# Patient Record
Sex: Female | Born: 1945
Health system: Southern US, Community
[De-identification: ages and names within clinical notes are randomized; demographics above are authoritative.]

## PROBLEM LIST (undated history)

## (undated) DIAGNOSIS — C50919 Malignant neoplasm of unspecified site of unspecified female breast: Secondary | ICD-10-CM

## (undated) DIAGNOSIS — B029 Zoster without complications: Secondary | ICD-10-CM

## (undated) DIAGNOSIS — K2970 Gastritis, unspecified, without bleeding: Secondary | ICD-10-CM

## (undated) DIAGNOSIS — G629 Polyneuropathy, unspecified: Secondary | ICD-10-CM

## (undated) DIAGNOSIS — I498 Other specified cardiac arrhythmias: Secondary | ICD-10-CM

## (undated) DIAGNOSIS — G43909 Migraine, unspecified, not intractable, without status migrainosus: Secondary | ICD-10-CM

## (undated) HISTORY — PX: CHOLECYSTECTOMY: SHX55

## (undated) HISTORY — PX: ABDOMINAL HYSTERECTOMY: SHX81

## (undated) HISTORY — PX: APPENDECTOMY: SHX54

## (undated) HISTORY — PX: TOTAL MASTECTOMY: SHX6129

---

## 2012-09-16 ENCOUNTER — Encounter (HOSPITAL_BASED_OUTPATIENT_CLINIC_OR_DEPARTMENT_OTHER): Payer: Self-pay | Admitting: Emergency Medicine

## 2012-09-16 ENCOUNTER — Inpatient Hospital Stay (HOSPITAL_BASED_OUTPATIENT_CLINIC_OR_DEPARTMENT_OTHER)
Admission: EM | Admit: 2012-09-16 | Discharge: 2012-09-22 | DRG: 441 | Disposition: A | Discharge status: Home / Self Care | Payer: Medicare Other | Attending: Internal Medicine | Admitting: Internal Medicine

## 2012-09-16 ENCOUNTER — Emergency Department (HOSPITAL_BASED_OUTPATIENT_CLINIC_OR_DEPARTMENT_OTHER): Payer: Medicare Other

## 2012-09-16 DIAGNOSIS — Z681 Body mass index (BMI) 19 or less, adult: Secondary | ICD-10-CM

## 2012-09-16 DIAGNOSIS — Z79899 Other long term (current) drug therapy: Secondary | ICD-10-CM

## 2012-09-16 DIAGNOSIS — F411 Generalized anxiety disorder: Secondary | ICD-10-CM | POA: Diagnosis present

## 2012-09-16 DIAGNOSIS — K759 Inflammatory liver disease, unspecified: Secondary | ICD-10-CM

## 2012-09-16 DIAGNOSIS — R509 Fever, unspecified: Secondary | ICD-10-CM

## 2012-09-16 DIAGNOSIS — R112 Nausea with vomiting, unspecified: Secondary | ICD-10-CM | POA: Diagnosis present

## 2012-09-16 DIAGNOSIS — B027 Disseminated zoster: Secondary | ICD-10-CM

## 2012-09-16 DIAGNOSIS — E43 Unspecified severe protein-calorie malnutrition: Secondary | ICD-10-CM

## 2012-09-16 DIAGNOSIS — Z8673 Personal history of transient ischemic attack (TIA), and cerebral infarction without residual deficits: Secondary | ICD-10-CM

## 2012-09-16 DIAGNOSIS — Z9221 Personal history of antineoplastic chemotherapy: Secondary | ICD-10-CM

## 2012-09-16 DIAGNOSIS — R7402 Elevation of levels of lactic acid dehydrogenase (LDH): Secondary | ICD-10-CM | POA: Diagnosis present

## 2012-09-16 DIAGNOSIS — Z853 Personal history of malignant neoplasm of breast: Secondary | ICD-10-CM

## 2012-09-16 DIAGNOSIS — R7401 Elevation of levels of liver transaminase levels: Secondary | ICD-10-CM

## 2012-09-16 DIAGNOSIS — B029 Zoster without complications: Secondary | ICD-10-CM

## 2012-09-16 DIAGNOSIS — Z7901 Long term (current) use of anticoagulants: Secondary | ICD-10-CM

## 2012-09-16 DIAGNOSIS — Z8611 Personal history of tuberculosis: Secondary | ICD-10-CM

## 2012-09-16 DIAGNOSIS — G609 Hereditary and idiopathic neuropathy, unspecified: Secondary | ICD-10-CM | POA: Diagnosis present

## 2012-09-16 DIAGNOSIS — K769 Liver disease, unspecified: Principal | ICD-10-CM

## 2012-09-16 HISTORY — DX: Polyneuropathy, unspecified: G62.9

## 2012-09-16 HISTORY — DX: Zoster without complications: B02.9

## 2012-09-16 HISTORY — DX: Malignant neoplasm of unspecified site of unspecified female breast: C50.919

## 2012-09-16 HISTORY — DX: Gastritis, unspecified, without bleeding: K29.70

## 2012-09-16 HISTORY — DX: Migraine, unspecified, not intractable, without status migrainosus: G43.909

## 2012-09-16 LAB — URINE MICROSCOPIC-ADD ON

## 2012-09-16 LAB — CBC WITH DIFFERENTIAL/PLATELET
Basophils Absolute: 0 10*3/uL (ref 0.0–0.1)
Basophils Relative: 0 % (ref 0–1)
Eosinophils Absolute: 0 10*3/uL (ref 0.0–0.7)
Eosinophils Relative: 0 % (ref 0–5)
HCT: 38.3 % (ref 36.0–46.0)
MCHC: 33.2 g/dL (ref 30.0–36.0)
MCV: 91.4 fL (ref 78.0–100.0)
Monocytes Absolute: 0.6 10*3/uL (ref 0.1–1.0)
Platelets: 195 10*3/uL (ref 150–400)
RDW: 12.3 % (ref 11.5–15.5)
WBC: 7.4 10*3/uL (ref 4.0–10.5)

## 2012-09-16 LAB — URINALYSIS, ROUTINE W REFLEX MICROSCOPIC
Glucose, UA: NEGATIVE mg/dL
Leukocytes, UA: NEGATIVE
Protein, ur: NEGATIVE mg/dL
Specific Gravity, Urine: 1.02 (ref 1.005–1.030)
pH: 6 (ref 5.0–8.0)

## 2012-09-16 LAB — COMPREHENSIVE METABOLIC PANEL
ALT: 378 U/L — ABNORMAL HIGH (ref 0–35)
AST: 839 U/L — ABNORMAL HIGH (ref 0–37)
Albumin: 3.9 g/dL (ref 3.5–5.2)
Calcium: 9.1 mg/dL (ref 8.4–10.5)
Creatinine, Ser: 0.6 mg/dL (ref 0.50–1.10)
GFR calc non Af Amer: >90 mL/min (ref >90)
Sodium: 139 mEq/L (ref 135–145)
Total Protein: 6.4 g/dL (ref 6.0–8.3)

## 2012-09-16 MED ORDER — FENTANYL CITRATE 0.05 MG/ML IJ SOLN
50.0000 ug | Freq: Once | INTRAMUSCULAR | Status: AC
  Administered 2012-09-16: 50 ug via INTRAVENOUS
  Filled 2012-09-16: qty 2

## 2012-09-16 MED ORDER — MEPERIDINE HCL 50 MG/ML IJ SOLN
25.0000 mg | Freq: Once | INTRAMUSCULAR | Status: AC
  Administered 2012-09-16: 25 mg via INTRAVENOUS
  Filled 2012-09-16: qty 1

## 2012-09-16 MED ORDER — PROMETHAZINE HCL 25 MG/ML IJ SOLN
12.5000 mg | Freq: Once | INTRAMUSCULAR | Status: AC
  Administered 2012-09-16: 12.5 mg via INTRAVENOUS
  Filled 2012-09-16: qty 1

## 2012-09-16 MED ORDER — SODIUM CHLORIDE 0.9 % IV BOLUS (SEPSIS)
1000.0000 mL | Freq: Once | INTRAVENOUS | Status: AC
  Administered 2012-09-16: 1000 mL via INTRAVENOUS

## 2012-09-16 MED ORDER — ONDANSETRON HCL 4 MG/2ML IJ SOLN
4.0000 mg | Freq: Once | INTRAMUSCULAR | Status: AC
  Administered 2012-09-16: 4 mg via INTRAVENOUS
  Filled 2012-09-16: qty 2

## 2012-09-16 NOTE — ED Provider Notes (Signed)
CSN: [id number]     Arrival date & time 09/16/12  1627 History  This chart was scribed for Enid Skeens, MD by Ardelia Mems, ED Scribe. This patient was seen in room MH10/MH10 and the patient's care was started at 5:26 PM.   First MD Initiated Contact with Patient 09/16/12 1719     Chief Complaint  Patient presents with  . Herpes Zoster    The history is provided by the patient. No language interpreter was used.    HPI Comments: Monica Khan is a 67 y.o. female with a history of CA and shingles who presents to the Emergency Department complaining of a shingles rash onset about 1 week ago. She states that she is a cancer survivor, and that her history of 11 years of chemotherapy treatment has compromised her immune system. She states that she used to get shingles once a year, but has been getting it "all the time" these past 2 years. She also states that she has lost about 20 pounds in recent months. She states that she was seen for these symptoms 1 week ago and was started on acyclovir. She states that her shingles rash has been mostly present on her right shoulder and the right side of her back. She states that her rash is gradually improving, but she states that the shingles pain, which she is familiar with, persists despite this. She also reports a migraine headache, with greatest pain in her left eye, onset this morning. She states that she has a history of similar headaches. She also states that she has pain in her left knee.  She also reports waxing and waning, moderate "sharp, burning" RUQ abdominal pain onset a few hours ago, after she ate lunch today. She states that she ate a normal, non-suspicious meal today. She reports associated nausea, with 2 episodes of emesis, without blood. She states states that she has history of gastritis. She states that's she has a history of cholecystectomy, and a history of pancreatitis 30 years ago. She states that she has had several kidney stones since  receiving her chemotherapy. She states that her current abdominal pain does not feel like previous pain with kidney stones. She denies any history of hepatitis. She denies chest pain, leg swelling, fever, diarrhea or any other symptoms.    Past Medical History  Diagnosis Date  . Cancer   . Shingles   . Gastritis   . Peripheral neuropathy   . Migraines    Past Surgical History  Procedure Laterality Date  . Total mastectomy    . Appendectomy    . Cholecystectomy    . Abdominal hysterectomy     History reviewed. No pertinent family history. History  Substance Use Topics  . Smoking status: Not on file  . Smokeless tobacco: Not on file  . Alcohol Use: Not on file   OB History   Grav Para Term Preterm Abortions TAB SAB Ect Mult Living                 Review of Systems  Constitutional: Negative for fever.  Cardiovascular: Negative for chest pain and leg swelling.  Gastrointestinal: Positive for nausea, vomiting and abdominal pain (RUQ). Negative for diarrhea.  Musculoskeletal: Positive for arthralgias (left knee).  Skin: Positive for rash.  Neurological: Positive for headaches.  All other systems reviewed and are negative.    Allergies  Cephalosporins; Penicillins; Codeine; Compazine; and Morphine and related  Home Medications   Current Outpatient Rx  Name  Route  Sig  Dispense  Refill  . meperidine (DEMEROL) 50 MG tablet   Oral   Take 50 mg by mouth every 4 (four) hours as needed for pain.         Marland Kitchen omeprazole (PRILOSEC) 40 MG capsule   Oral   Take 40 mg by mouth daily.         . temazepam (RESTORIL) 30 MG capsule   Oral   Take 30 mg by mouth at bedtime as needed for sleep.         . traZODone (DESYREL) 50 MG tablet   Oral   Take 50 mg by mouth at bedtime.          Triage Vitals: BP 115/70  Pulse 90  Temp(Src) 98.1 F (36.7 C) (Oral)  Resp 16  Ht 4\' 10"  (1.473 m)  Wt 82 lb (37.195 kg)  BMI 17.14 kg/m2  SpO2 100%  Physical Exam  Nursing  note and vitals reviewed. Constitutional: She is oriented to person, place, and time. She appears well-developed and well-nourished.  HENT:  Head: Normocephalic and atraumatic.  Eyes: Conjunctivae are normal. Pupils are equal, round, and reactive to light.  Neck: Normal range of motion. Neck supple. No tracheal deviation present.  Cardiovascular: Normal rate, regular rhythm and normal heart sounds.   No murmur heard. Pulmonary/Chest: Effort normal and breath sounds normal. No respiratory distress.  Abdominal: Soft. Bowel sounds are normal. There is no tenderness.  Musculoskeletal: Normal range of motion. She exhibits no edema.  Neurological: She is alert and oriented to person, place, and time. No cranial nerve deficit.  Cranial nerves 2-12 are grossly intact.  Skin: Skin is warm and dry. Rash noted.  A few excoriations on the right scapula, no induration, no discharge.  Psychiatric: She has a normal mood and affect.    ED Course   Medications  sodium chloride 0.9 % bolus 1,000 mL (0 mLs Intravenous Stopped 09/16/12 1944)  meperidine (DEMEROL) injection 25 mg (25 mg Intravenous Given 09/16/12 1846)  promethazine (PHENERGAN) injection 12.5 mg (12.5 mg Intravenous Given 09/16/12 1845)    Procedures (including critical care time)  DIAGNOSTIC STUDIES: Oxygen Saturation is 100% on RA, normal by my interpretation.    COORDINATION OF CARE: 5:50 PM- Pt advised of plan for treatment with Demerol, Phenergan and IV fluids, along with plan for diagnostic lab work and radiology and pt agrees.   Labs Reviewed  CBC WITH DIFFERENTIAL - Abnormal; Notable for the following:    Neutrophils Relative % 79 (*)    All other components within normal limits  COMPREHENSIVE METABOLIC PANEL - Abnormal; Notable for the following:    AST 839 (*)    ALT 378 (*)    All other components within normal limits  URINALYSIS, ROUTINE W REFLEX MICROSCOPIC - Abnormal; Notable for the following:    Color, Urine AMBER  (*)    Hgb urine dipstick MODERATE (*)    Bilirubin Urine SMALL (*)    All other components within normal limits  LIPASE, BLOOD  URINE MICROSCOPIC-ADD ON    Ct Abdomen Pelvis Wo Contrast  09/16/2012   *RADIOLOGY REPORT*  Clinical Data: Right flank pain  CT ABDOMEN AND PELVIS WITHOUT CONTRAST  Technique:  Multidetector CT imaging of the abdomen and pelvis was performed following the standard protocol without intravenous contrast.  Comparison: None.  Findings: No urinary calculus.  No hydronephrosis.  Post cholecystectomy clips.  Pneumobilia in the liver is most likely related to biliary instrumentation.  Spleen, pancreas, adrenal glands are grossly within normal limits.  Prominent stool burden in the ascending and transverse colon.  IMPRESSION: Postoperative changes.  No evidence of urinary obstruction.  Prominent stool burden in the proximal colon.   Original Report Authenticated By: Jolaine Click, M.D.    No diagnosis found.  MDM  I personally performed the services described in this documentation, which was scribed in my presence. The recorded information has been reviewed and is accurate.  Pain recurrent in ED. Pt does not have her gb.  Discussed possible causes of RUQ and LFT elevation, possibly related to antivirals however further eval required.  Pt preferred inpt instead of outpt fup especially with pain control an issue.  Spoke with Dr Mendel Corning, accepted general medicine. Ct Abdomen Pelvis Wo Contrast  09/16/2012   *RADIOLOGY REPORT*  Clinical Data: Right flank pain  CT ABDOMEN AND PELVIS WITHOUT CONTRAST  Technique:  Multidetector CT imaging of the abdomen and pelvis was performed following the standard protocol without intravenous contrast.  Comparison: None.  Findings: No urinary calculus.  No hydronephrosis.  Post cholecystectomy clips.  Pneumobilia in the liver is most likely related to biliary instrumentation.  Spleen, pancreas, adrenal glands are grossly within normal limits.   Prominent stool burden in the ascending and transverse colon.  IMPRESSION: Postoperative changes.  No evidence of urinary obstruction.  Prominent stool burden in the proximal colon.   Original Report Authenticated By: Jolaine Click, M.D.     Enid Skeens, MD 09/16/12 2120

## 2012-09-16 NOTE — ED Notes (Signed)
Pt states 11 year breast cancer survivor, since having chemo, states has had shingles frequently, has had symptoms for a week and started taking acyclovir, now with pain in RUQ, states has had this pain with acyclovir before

## 2012-09-16 NOTE — ED Notes (Signed)
Assigned to room 5N32 @ [facility], RN notified, [facility] called for transport.

## 2012-09-17 ENCOUNTER — Inpatient Hospital Stay (HOSPITAL_COMMUNITY): Payer: Medicare Other

## 2012-09-17 ENCOUNTER — Encounter (HOSPITAL_COMMUNITY): Payer: Self-pay | Admitting: Internal Medicine

## 2012-09-17 DIAGNOSIS — Z853 Personal history of malignant neoplasm of breast: Secondary | ICD-10-CM

## 2012-09-17 DIAGNOSIS — R112 Nausea with vomiting, unspecified: Secondary | ICD-10-CM | POA: Diagnosis present

## 2012-09-17 DIAGNOSIS — B029 Zoster without complications: Secondary | ICD-10-CM | POA: Diagnosis present

## 2012-09-17 DIAGNOSIS — K769 Liver disease, unspecified: Secondary | ICD-10-CM | POA: Diagnosis present

## 2012-09-17 LAB — CBC
Platelets: 185 10*3/uL (ref 150–400)
RBC: 4.11 MIL/uL (ref 3.87–5.11)
WBC: 6.8 10*3/uL (ref 4.0–10.5)

## 2012-09-17 LAB — COMPREHENSIVE METABOLIC PANEL
ALT: 582 U/L — ABNORMAL HIGH (ref 0–35)
AST: 689 U/L — ABNORMAL HIGH (ref 0–37)
CO2: 28 mEq/L (ref 19–32)
Calcium: 8.3 mg/dL — ABNORMAL LOW (ref 8.4–10.5)
Chloride: 102 mEq/L (ref 96–112)
GFR calc non Af Amer: >90 mL/min (ref >90)
Sodium: 137 mEq/L (ref 135–145)
Total Bilirubin: 1.1 mg/dL (ref 0.3–1.2)

## 2012-09-17 MED ORDER — TRAZODONE HCL 50 MG PO TABS
50.0000 mg | ORAL_TABLET | Freq: Every day | ORAL | Status: DC
  Administered 2012-09-17 – 2012-09-21 (×5): 50 mg via ORAL
  Filled 2012-09-17 (×7): qty 1

## 2012-09-17 MED ORDER — ZOLPIDEM TARTRATE 5 MG PO TABS
5.0000 mg | ORAL_TABLET | Freq: Every evening | ORAL | Status: DC | PRN
  Administered 2012-09-17 – 2012-09-21 (×4): 5 mg via ORAL
  Filled 2012-09-17 (×4): qty 1

## 2012-09-17 MED ORDER — MEPERIDINE HCL 50 MG PO TABS
50.0000 mg | ORAL_TABLET | Freq: Four times a day (QID) | ORAL | Status: DC | PRN
  Administered 2012-09-17 (×3): 50 mg via ORAL
  Administered 2012-09-18 (×2): 100 mg via ORAL
  Administered 2012-09-18 (×2): 50 mg via ORAL
  Administered 2012-09-18 – 2012-09-22 (×14): 100 mg via ORAL
  Filled 2012-09-17 (×10): qty 2
  Filled 2012-09-17: qty 1
  Filled 2012-09-17 (×6): qty 2
  Filled 2012-09-17: qty 1
  Filled 2012-09-17: qty 2
  Filled 2012-09-17: qty 1

## 2012-09-17 MED ORDER — PANTOPRAZOLE SODIUM 40 MG PO TBEC
80.0000 mg | DELAYED_RELEASE_TABLET | Freq: Every day | ORAL | Status: DC
  Administered 2012-09-17 – 2012-09-22 (×6): 80 mg via ORAL
  Filled 2012-09-17 (×6): qty 2

## 2012-09-17 MED ORDER — HEPARIN SODIUM (PORCINE) 5000 UNIT/ML IJ SOLN
5000.0000 [IU] | Freq: Three times a day (TID) | INTRAMUSCULAR | Status: DC
  Administered 2012-09-17 – 2012-09-19 (×7): 5000 [IU] via SUBCUTANEOUS
  Filled 2012-09-17 (×11): qty 1

## 2012-09-17 MED ORDER — ONDANSETRON HCL 4 MG/2ML IJ SOLN
4.0000 mg | Freq: Four times a day (QID) | INTRAMUSCULAR | Status: DC | PRN
  Administered 2012-09-17: 4 mg via INTRAVENOUS
  Filled 2012-09-17 (×3): qty 2

## 2012-09-17 MED ORDER — VALACYCLOVIR HCL 500 MG PO TABS
1000.0000 mg | ORAL_TABLET | Freq: Three times a day (TID) | ORAL | Status: DC
  Administered 2012-09-17 – 2012-09-22 (×14): 1000 mg via ORAL
  Filled 2012-09-17 (×19): qty 2

## 2012-09-17 MED ORDER — MEPERIDINE HCL 50 MG PO TABS
50.0000 mg | ORAL_TABLET | ORAL | Status: DC | PRN
  Administered 2012-09-17 (×3): 50 mg via ORAL
  Filled 2012-09-17 (×3): qty 1

## 2012-09-17 MED ORDER — PROMETHAZINE HCL 25 MG/ML IJ SOLN
12.5000 mg | Freq: Four times a day (QID) | INTRAMUSCULAR | Status: DC | PRN
  Administered 2012-09-17 – 2012-09-21 (×15): 12.5 mg via INTRAVENOUS
  Filled 2012-09-17 (×16): qty 1

## 2012-09-17 MED ORDER — TEMAZEPAM 15 MG PO CAPS: 30.0000 mg | ORAL_CAPSULE | Freq: Every evening | ORAL | Status: DC | PRN

## 2012-09-17 NOTE — H&P (Signed)
[facility] History and Physical  Monica Khan ZOX:[id number] DOB: [birth year] DOA: 09/16/2012  Referring physician: ED PCP: No primary provider on file.  Chief Complaint: Weight loss, Shingles, RUQ abd pain  HPI: Monica Khan is a 67 y.o. female who presents to [facility] ED with c/o abdominal pain.  Abd pain is moderate in intensity, sharp, located in her RUQ, and onset a few hours ago after eating a normal meal (h/o cholecystectomy).  Associated nausea with 2 episodes of vomiting (NBNB).  H/o gastritis as well.  No previous known history of hepatitis, does get recurrent shingles frequently and has a singles outbreak at this time she states for which she is on acyclovir.  In the ED she was found to have markedly elevated transaminases and she was transferred to [facility] for further workup.  Review of Systems: 12 systems reviewed and otherwise negative.  Past Medical History  Diagnosis Date  . Breast cancer     In remission  . Shingles     Recurrent  . Gastritis   . Peripheral neuropathy   . Migraines    Past Surgical History  Procedure Laterality Date  . Total mastectomy    . Appendectomy    . Cholecystectomy    . Abdominal hysterectomy     Social History:  reports that she does not drink alcohol or use illicit drugs. Her tobacco history is not on file.   Allergies  Allergen Reactions  . Cephalosporins Anaphylaxis  . Penicillins Anaphylaxis  . Codeine     sickl  . Compazine (Prochlorperazine Edisylate)     Throat swells  . Morphine And Related     Nausea and vomiting    History reviewed. No pertinent family history.  Monica Khan died of lung cancer.  Prior to Admission medications   Medication Sig Start Date End Date Taking? Authorizing Provider  meperidine (DEMEROL) 50 MG tablet Take 50 mg by mouth every 4 (four) hours as needed for pain.   Yes Historical Provider, MD  omeprazole (PRILOSEC) 40 MG capsule Take 40 mg by mouth daily.   Yes Historical Provider, MD  temazepam  (RESTORIL) 30 MG capsule Take 30 mg by mouth at bedtime as needed for sleep.   Yes Historical Provider, MD  traZODone (DESYREL) 50 MG tablet Take 50 mg by mouth at bedtime.   Yes Historical Provider, MD   Physical Exam: Filed Vitals:   09/16/12 2257  BP: 115/85  Pulse: 96  Temp: 99.9 F (37.7 C)  Resp: 18     General:  NAD, resting comfortably in bed  Eyes: PEERLA EOMI  ENT: mucous membranes moist  Neck: supple w/o JVD  Cardiovascular: RRR w/o MRG  Respiratory: CTA B  Abdomen: soft, nt, nd, bs+  Skin: no rash nor lesion  Musculoskeletal: MAE, full ROM all 4 extremities  Psychiatric: normal tone and affect  Neurologic: AAOx3, grossly non-focal   Labs on Admission:  Basic Metabolic Panel:  Recent Labs Lab 09/16/12 1845  NA 139  K 3.6  CL 102  CO2 30  GLUCOSE 92  BUN 13  CREATININE 0.60  CALCIUM 9.1   Liver Function Tests:  Recent Labs Lab 09/16/12 1845  AST 839*  ALT 378*  ALKPHOS 113  BILITOT 1.1  PROT 6.4  ALBUMIN 3.9    Recent Labs Lab 09/16/12 1845  LIPASE 28   No results found for this basename: AMMONIA,  in the last 168 hours CBC:  Recent Labs Lab 09/16/12 1845  WBC 7.4  NEUTROABS 5.9  HGB 12.7  HCT 38.3  MCV 91.4  PLT 195   Cardiac Enzymes: No results found for this basename: CKTOTAL, CKMB, CKMBINDEX, TROPONINI,  in the last 168 hours  BNP (last 3 results) No results found for this basename: PROBNP,  in the last 8760 hours CBG: No results found for this basename: GLUCAP,  in the last 168 hours  Radiological Exams on Admission: Ct Abdomen Pelvis Wo Contrast  09/16/2012   *RADIOLOGY REPORT*  Clinical Data: Right flank pain  CT ABDOMEN AND PELVIS WITHOUT CONTRAST  Technique:  Multidetector CT imaging of the abdomen and pelvis was performed following the standard protocol without intravenous contrast.  Comparison: None.  Findings: No urinary calculus.  No hydronephrosis.  Post cholecystectomy clips.  Pneumobilia in the liver  is most likely related to biliary instrumentation.  Spleen, pancreas, adrenal glands are grossly within normal limits.  Prominent stool burden in the ascending and transverse colon.  IMPRESSION: Postoperative changes.  No evidence of urinary obstruction.  Prominent stool burden in the proximal colon.   Original Report Authenticated By: Jolaine Click, M.D.    EKG: Independently reviewed.  Assessment/Plan Principal Problem:   Transaminitis Active Problems:   Hepatocellular injury   Nausea and vomiting   Shingles   History of breast cancer   1. Transaminitis - with a hepatocellular pattern, Despite the  > 2:1 AST:ALT ratio, the patient denies any EtOH use what so ever, last drink was a few oz of wine when her Monica Khan died 2 years ago, nothing since then, never has been a heavy drinker.  Her biggest fear is that her BRCA has come back with mets to liver (wasnt seen on CT scan but this is in the differential of course).  Have ordered liver studies including viral, autoimmune, and imaging studies of the liver to try and work this up further.  CT scan of liver didn't show any suspicious lesions.  While acyclovir CAN cause elevation of liver enzymes, neither I nor pharmacist have ever heard of it being hepatoxic to this degree (usually renal involvement).  Likely patient will benefit from GI involvement in the morning. 2. Shingles continue acyclovir 3. N/V - zofran    Code Status: Full Code (must indicate code status--if unknown or must be presumed, indicate so) Family Communication: No family in room (indicate person spoken with, if applicable, with phone number if by telephone) Disposition Plan: Admit to inpatient (indicate anticipated LOS)  Time spent: 70 min  Monica Khan M. [facility] Pager [phone]  If 7PM-7AM, please contact night-coverage [email or web address] Password [email or web address] 09/17/2012, 12:30 AM

## 2012-09-17 NOTE — Consult Note (Signed)
Subjective:   HPI  The patient is a 67 year old female who presented to the hospital emergency room with complaints of right upper quadrant sharp abdominal pain which started yesterday. She had some nausea and vomiting associated with this. She was found to have elevated liver enzymes. Total bilirubin on presentation was 1.1 alkaline phosphatase 113, AST 839 and ALT 378. She gives no history of hepatitis. She did have her gallbladder removed years ago. A CT scan of the abdomen was done but does not reveal any specific reason for this pain to be present. There was some pneumobilia. I asked her if she ever recalls having had an ERCP with sphincterotomy after her gallbladder was removed years ago but she does not recall.  She does have a history of recurring shingles. She takes acyclovir year to get it under control when it occurs.  Review of Systems Denies chest pain or shortness of breath  Past Medical History  Diagnosis Date  . Breast cancer     In remission  . Shingles     Recurrent  . Gastritis   . Peripheral neuropathy   . Migraines    Past Surgical History  Procedure Laterality Date  . Total mastectomy    . Appendectomy    . Cholecystectomy    . Abdominal hysterectomy     History   Social History  . Marital Status: Widowed    Spouse Name: N/A    Number of Children: N/A  . Years of Education: N/A   Occupational History  . Not on file.   Social History Main Topics  . Smoking status: Never Smoker   . Smokeless tobacco: Not on file  . Alcohol Use: No     Comment: States she has never been a heavy drinker and last EtOH was 2 years ago when husband died.  . Drug Use: No  . Sexually Active: Not on file   Other Topics Concern  . Not on file   Social History Narrative  . No narrative on file   @FAMHXP (<SUBSCRIPT> error)@ Current facility-administered medications:heparin injection 5,000 Units, 5,000 Units, Subcutaneous, Q8H, Hillary Bow, DO, 5,000 Units at 09/17/12  0110;  meperidine (DEMEROL) tablet 50 mg, 50 mg, Oral, Q4H PRN, Hillary Bow, DO, 50 mg at 09/17/12 1123;  ondansetron [facility]) injection 4 mg, 4 mg, Intravenous, Q6H PRN, Hillary Bow, DO, 4 mg at 09/17/12 1138 pantoprazole (PROTONIX) EC tablet 80 mg, 80 mg, Oral, Daily, Hillary Bow, DO, 80 mg at 09/17/12 1122;  traZODone (DESYREL) tablet 50 mg, 50 mg, Oral, QHS, Jared M. Gardner, DO, 50 mg at 09/17/12 0110;  valACYclovir (VALTREX) tablet 1,000 mg, 1,000 mg, Oral, TID, Richarda Overlie, MD;  zolpidem (AMBIEN) tablet 5 mg, 5 mg, Oral, QHS PRN, Tarry Kos, MD Allergies  Allergen Reactions  . Cephalosporins Anaphylaxis  . Penicillins Anaphylaxis  . Codeine     sickl  . Compazine (Prochlorperazine Edisylate)     Throat swells  . Morphine And Related     Nausea and vomiting     Objective:     BP 113/43  Pulse 92  Temp(Src) 99.1 F (37.3 C) (Oral)  Resp 16  Ht 4\' 11"  (1.499 m)  Wt 37.195 kg (82 lb)  BMI 16.55 kg/m2  SpO2 99%  She is in no distress  Nonicteric  Heart regular rhythm no murmurs  Lungs clear  Abdomen: Bowel sounds are present, it is soft, she does complain of some discomfort in the right upper quadrant. I  do not see any outbreak of shingles in this region.  Laboratory No components found with this basename: d1      Assessment:     Right upper quadrant abdominal pain with associated elevation of liver enzymes.      Plan:     Agree with checking hepatitis serologies as well as other serologies to look for a source of this problem. I recommend we do an MRCP just to see if we might be dealing with choledocholithiasis.  Lab Results  Component Value Date   HGB 12.7 09/17/2012   HGB 12.7 09/16/2012   HCT 36.6 09/17/2012   HCT 38.3 09/16/2012   ALKPHOS 121* 09/17/2012   ALKPHOS 113 09/16/2012   AST 689* 09/17/2012   AST 839* 09/16/2012   ALT 582* 09/17/2012   ALT 378* 09/16/2012

## 2012-09-17 NOTE — Progress Notes (Addendum)
Patient seen and examined Transaminitis of unclear etiology Will consult gastroenterology Serologies, autoimmune workup pending Valtrex for shingles MRCP per dr Evette Cristal

## 2012-09-18 ENCOUNTER — Inpatient Hospital Stay (HOSPITAL_COMMUNITY): Payer: Medicare Other

## 2012-09-18 LAB — COMPREHENSIVE METABOLIC PANEL
AST: 206 U/L — ABNORMAL HIGH (ref 0–37)
CO2: 21 mEq/L (ref 19–32)
Calcium: 8.5 mg/dL (ref 8.4–10.5)
Creatinine, Ser: 0.5 mg/dL (ref 0.50–1.10)
GFR calc Af Amer: >90 mL/min (ref >90)
GFR calc non Af Amer: >90 mL/min (ref >90)
Glucose, Bld: 61 mg/dL — ABNORMAL LOW (ref 70–99)

## 2012-09-18 LAB — HEPATITIS PANEL, ACUTE
Hep B C IgM: NEGATIVE
Hepatitis B Surface Ag: NEGATIVE

## 2012-09-18 LAB — ANA: Anti Nuclear Antibody(ANA): NEGATIVE

## 2012-09-18 MED ORDER — GADOBENATE DIMEGLUMINE 529 MG/ML IV SOLN
10.0000 mL | Freq: Once | INTRAVENOUS | Status: AC | PRN
  Administered 2012-09-18: 8 mL via INTRAVENOUS

## 2012-09-18 MED ORDER — IBUPROFEN 800 MG PO TABS
800.0000 mg | ORAL_TABLET | Freq: Once | ORAL | Status: AC
  Administered 2012-09-18: 800 mg via ORAL
  Filled 2012-09-18: qty 1

## 2012-09-18 MED ORDER — LORAZEPAM 2 MG/ML IJ SOLN
INTRAMUSCULAR | Status: AC
  Filled 2012-09-18: qty 1

## 2012-09-18 MED ORDER — LORAZEPAM 2 MG/ML IJ SOLN: 1.0000 mg | Freq: Once | INTRAMUSCULAR | Status: DC

## 2012-09-18 MED ORDER — BOOST / RESOURCE BREEZE PO LIQD
1.0000 | Freq: Three times a day (TID) | ORAL | Status: DC
  Administered 2012-09-18 – 2012-09-22 (×9): 1 via ORAL

## 2012-09-18 NOTE — Progress Notes (Signed)
[facility] PROGRESS NOTE  Aftin Lye ZOX:[id number] DOB: Feb 10, 1945 DOA: 09/16/2012 PCP: No primary provider on file.  Assessment/Plan: Principal Problem:   Transaminitis Active Problems:   Hepatocellular injury   Nausea and vomiting   Shingles   History of breast cancer     1. Transaminitis - with a hepatocellular pattern, Despite the > 2:1 AST:ALT ratio, the patient denies any EtOH use what so ever, last drink was a few oz of wine when her husband died 2 years ago, nothing since then, never has been a heavy drinker. MRCP pending to rule out choledocho lithiasis. Have ordered liver studies including viral, autoimmune, and imaging studies of the liver to try and work this up further. CT scan of liver didn't show any suspicious lesions.  2.  3. Shingles continue Valtrex for 3 more days then DC , no visible rash except couple of pustules  4. N/V - zofran 5.   HPI/Subjective: Very anxious , still hurting, cannot tolerate being isolated   Objective: Filed Vitals:   09/17/12 0605 09/17/12 1315 09/17/12 2048 09/18/12 0614  BP: 109/41 113/43 121/56 98/37  Pulse: 96 92 94 98  Temp: 100.5 F (38.1 C) 99.1 F (37.3 C) 99.5 F (37.5 C) 98.7 F (37.1 C)  TempSrc: Oral   Oral  Resp: 16 16 16    Height:      Weight:      SpO2: 97% 99% 99% 99%    Intake/Output Summary (Last 24 hours) at 09/18/12 0851 Last data filed at 09/18/12 0500  Gross per 24 hour  Intake   1200 ml  Output      0 ml  Net   1200 ml    Exam:  General: NAD, resting comfortably in bed  Eyes: PEERLA EOMI  ENT: mucous membranes moist  Neck: supple w/o JVD  Cardiovascular: RRR w/o MRG  Respiratory: CTA B  Abdomen: soft, nt, nd, bs+  Skin: no rash nor lesion  Musculoskeletal: MAE, full ROM all 4 extremities  Psychiatric: normal tone and affect  Neurologic: AAOx3, grossly non-focal    Data Reviewed: Basic Metabolic Panel:  Recent Labs Lab 09/16/12 1845 09/17/12 0730 09/18/12 0431  NA 139  137 136  K 3.6 3.6 3.3*  CL 102 102 99  CO2 30 28 21   GLUCOSE 92 91 61*  BUN 13 11 12   CREATININE 0.60 0.56 0.50  CALCIUM 9.1 8.3* 8.5  MG  --   --  2.1    Liver Function Tests:  Recent Labs Lab 09/16/12 1845 09/17/12 0730 09/18/12 0431  AST 839* 689* 206*  ALT 378* 582* 358*  ALKPHOS 113 121* 118*  BILITOT 1.1 1.1 1.1  PROT 6.4 5.6* 5.7*  ALBUMIN 3.9 3.3* 3.2*    Recent Labs Lab 09/16/12 1845  LIPASE 28   No results found for this basename: AMMONIA,  in the last 168 hours  CBC:  Recent Labs Lab 09/16/12 1845 09/17/12 0730  WBC 7.4 6.8  NEUTROABS 5.9  --   HGB 12.7 12.7  HCT 38.3 36.6  MCV 91.4 89.1  PLT 195 185    Cardiac Enzymes: No results found for this basename: CKTOTAL, CKMB, CKMBINDEX, TROPONINI,  in the last 168 hours BNP (last 3 results) No results found for this basename: PROBNP,  in the last 8760 hours   CBG: No results found for this basename: GLUCAP,  in the last 168 hours  No results found for this or any previous visit (from the past 240 hour(s)).  Studies: Ct Abdomen Pelvis Wo Contrast  09/16/2012   *RADIOLOGY REPORT*  Clinical Data: Right flank pain  CT ABDOMEN AND PELVIS WITHOUT CONTRAST  Technique:  Multidetector CT imaging of the abdomen and pelvis was performed following the standard protocol without intravenous contrast.  Comparison: None.  Findings: No urinary calculus.  No hydronephrosis.  Post cholecystectomy clips.  Pneumobilia in the liver is most likely related to biliary instrumentation.  Spleen, pancreas, adrenal glands are grossly within normal limits.  Prominent stool burden in the ascending and transverse colon.  IMPRESSION: Postoperative changes.  No evidence of urinary obstruction.  Prominent stool burden in the proximal colon.   Original Report Authenticated By: Jolaine Click, M.D.    Scheduled Meds: . heparin  5,000 Units Subcutaneous Q8H  . LORazepam      . LORazepam  1 mg Intravenous Once  . pantoprazole  80 mg  Oral Daily  . traZODone  50 mg Oral QHS  . valACYclovir  1,000 mg Oral TID   Continuous Infusions:   Principal Problem:   Transaminitis Active Problems:   Hepatocellular injury   Nausea and vomiting   Shingles   History of breast cancer    Time spent: 40 minutes   [facility]  [facility] Pager [phone]. If 8PM-8AM, please contact night-coverage at [email or web address], password [facility] 09/18/2012, 8:51 AM  LOS: 2 days

## 2012-09-18 NOTE — Progress Notes (Signed)
INITIAL NUTRITION ASSESSMENT  DOCUMENTATION CODES Per approved criteria  -Severe malnutrition in the context of chronic illness -Underweight   INTERVENTION: Resource Breeze po TID, each supplement provides 250 kcal and 9 grams of protein.  NUTRITION DIAGNOSIS: Malnutrition related to chronic illness as evidenced by severe fat and muscle wasting.   Goal: Pt to meet >/= 90% of their estimated nutrition needs   Monitor:  PO intake, supplement acceptance, weight trend, labs  Reason for Assessment: Pt identified as at nutrition risk on the Malnutrition Screen Tool  67 y.o. female  Admitting Dx: Transaminitis  ASSESSMENT: Pt admitted with abdominal pain. Pt with hx of shingles.  Per pt she has lost 20 lb in the last 2 years. Reason is not clear to pt. Per pt she is eating well. Pt eats 3 meals per day. Breakfast: cereal, banana, coffee; Lunch: [place] sandwich, chips; Dinner: meat, starch, veg or take-out from [employer] type places. Husband died 2 years ago. Per H&P pt has had recurrent shingles recently. Pt c/o nausea since Saturday. Pt has not yet had a meal this admission.   Nutrition Focused Physical Exam:  Subcutaneous Fat:  Orbital Region: WNL Upper Arm Region: severe wasting Thoracic and Lumbar Region: mild-moderate wasting  Muscle:  Temple Region: severe wasting Clavicle Bone Region: severe wasting Clavicle and Acromion Bone Region: severe wasting Scapular Bone Region: severe wasting Dorsal Hand: severe wasting Patellar Region: severe wasting Anterior Thigh Region: severe wasting Posterior Calf Region: severe wasting  Edema: not present   Height: Ht Readings from Last 1 Encounters:  09/16/12 4\' 11"  (1.499 m)    Weight: Wt Readings from Last 1 Encounters:  09/16/12 82 lb (37.195 kg)    Ideal Body Weight: 44.6 kg   % Ideal Body Weight: 83%  Wt Readings from Last 10 Encounters:  09/16/12 82 lb (37.195 kg)    Usual Body Weight: 100 lb   % Usual Body Weight:  82%  BMI:  Body mass index is 16.55 kg/(m^2).  Estimated Nutritional Needs: Kcal: 1200-1400 Protein: 60-70 grams Fluid: > 1.5 L/day  Skin: reddened shoulder  Diet Order: Dysphagia 3 with Thin Liquids  EDUCATION NEEDS: -No education needs identified at this time   Intake/Output Summary (Last 24 hours) at 09/18/12 1338 Last data filed at 09/18/12 0500  Gross per 24 hour  Intake    720 ml  Output      0 ml  Net    720 ml    Last BM: 8/9   Labs:   Recent Labs Lab 09/16/12 1845 09/17/12 0730 09/18/12 0431  NA 139 137 136  K 3.6 3.6 3.3*  CL 102 102 99  CO2 30 28 21   BUN 13 11 12   CREATININE 0.60 0.56 0.50  CALCIUM 9.1 8.3* 8.5  MG  --   --  2.1  GLUCOSE 92 91 61*    CBG (last 3)  No results found for this basename: GLUCAP,  in the last 72 hours  Scheduled Meds: . heparin  5,000 Units Subcutaneous Q8H  . LORazepam      . LORazepam  1 mg Intravenous Once  . pantoprazole  80 mg Oral Daily  . traZODone  50 mg Oral QHS  . valACYclovir  1,000 mg Oral TID    Continuous Infusions:   Past Medical History  Diagnosis Date  . Breast cancer     In remission  . Shingles     Recurrent  . Gastritis   . Peripheral neuropathy   .  Migraines     Past Surgical History  Procedure Laterality Date  . Total mastectomy    . Appendectomy    . Cholecystectomy    . Abdominal hysterectomy      Kendell Bane RD, LDN, CNSC [phone] Pager [phone] After Hours Pager

## 2012-09-18 NOTE — Progress Notes (Signed)
[facility] Progress Note  Subjective: Feels better.   Objective: Vital signs in last 24 hours: Temp:  [98.7 F (37.1 C)-99.5 F (37.5 C)] 98.7 F (37.1 C) (08/11 0614) Pulse Rate:  [92-98] 98 (08/11 0614) Resp:  [16] 16 (08/10 2048) BP: (98-121)/(37-56) 98/37 mmHg (08/11 0614) SpO2:  [99 %] 99 % (08/11 0614) Weight change:    PE: Non icteric Abdomen: still some tenderness in RUQ MRCP negative for choledocholithiasis of anything else to explain her symptoms. Awaiting other serologies.  Lab Results: Results for orders placed during the hospital encounter of 09/16/12 (from the past 24 hour(s))  COMPREHENSIVE METABOLIC PANEL     Status: Abnormal   Collection Time    09/18/12  4:31 AM      Result Value Range   Sodium 136  135 - 145 mEq/L   Potassium 3.3 (*) 3.5 - 5.1 mEq/L   Chloride 99  96 - 112 mEq/L   CO2 21  19 - 32 mEq/L   Glucose, Bld 61 (*) 70 - 99 mg/dL   BUN 12  6 - 23 mg/dL   Creatinine, Ser 1.61  0.50 - 1.10 mg/dL   Calcium 8.5  8.4 - 09.6 mg/dL   Total Protein 5.7 (*) 6.0 - 8.3 g/dL   Albumin 3.2 (*) 3.5 - 5.2 g/dL   AST 045 (*) 0 - 37 U/L   ALT 358 (*) 0 - 35 U/L   Alkaline Phosphatase 118 (*) 39 - 117 U/L   Total Bilirubin 1.1  0.3 - 1.2 mg/dL   GFR calc non Af Amer >90  >90 mL/min   GFR calc Af Amer >90  >90 mL/min  MAGNESIUM     Status: None   Collection Time    09/18/12  4:31 AM      Result Value Range   Magnesium 2.1  1.5 - 2.5 mg/dL    Studies/Results: @RISRSLT24 @    Assessment: Elevated LFTs  Plan: Supportive care. Await further serologies and follow LFTs    Maicol Bowland F 09/18/2012, 11:28 AM  Lab Results  Component Value Date   HGB 12.7 09/17/2012   HGB 12.7 09/16/2012   HCT 36.6 09/17/2012   HCT 38.3 09/16/2012   ALKPHOS 118* 09/18/2012   ALKPHOS 121* 09/17/2012   ALKPHOS 113 09/16/2012   AST 206* 09/18/2012   AST 689* 09/17/2012   AST 839* 09/16/2012   ALT 358* 09/18/2012   ALT 582* 09/17/2012   ALT 378* 09/16/2012

## 2012-09-19 ENCOUNTER — Inpatient Hospital Stay (HOSPITAL_COMMUNITY): Payer: Medicare Other

## 2012-09-19 LAB — URINALYSIS, ROUTINE W REFLEX MICROSCOPIC
Glucose, UA: NEGATIVE mg/dL
Specific Gravity, Urine: 1.021 (ref 1.005–1.030)

## 2012-09-19 LAB — URINE MICROSCOPIC-ADD ON

## 2012-09-19 LAB — COMPREHENSIVE METABOLIC PANEL
ALT: 227 U/L — ABNORMAL HIGH (ref 0–35)
AST: 60 U/L — ABNORMAL HIGH (ref 0–37)
Albumin: 3.1 g/dL — ABNORMAL LOW (ref 3.5–5.2)
Alkaline Phosphatase: 109 U/L (ref 39–117)
Potassium: 3.3 mEq/L — ABNORMAL LOW (ref 3.5–5.1)
Sodium: 140 mEq/L (ref 135–145)
Total Protein: 6.1 g/dL (ref 6.0–8.3)

## 2012-09-19 LAB — MITOCHONDRIAL ANTIBODIES: Mitochondrial M2 Ab, IgG: 1.23 — ABNORMAL HIGH (ref <0.91)

## 2012-09-19 MED ORDER — WARFARIN - PHARMACIST DOSING INPATIENT: Freq: Every day | Status: DC

## 2012-09-19 MED ORDER — IBUPROFEN 800 MG PO TABS
800.0000 mg | ORAL_TABLET | Freq: Once | ORAL | Status: AC
  Administered 2012-09-19: 800 mg via ORAL
  Filled 2012-09-19: qty 1

## 2012-09-19 MED ORDER — SODIUM CHLORIDE 0.9 % IV BOLUS (SEPSIS)
500.0000 mL | Freq: Once | INTRAVENOUS | Status: AC
  Administered 2012-09-19: 500 mL via INTRAVENOUS

## 2012-09-19 MED ORDER — WARFARIN SODIUM 4 MG PO TABS
4.0000 mg | ORAL_TABLET | Freq: Once | ORAL | Status: AC
  Administered 2012-09-19: 4 mg via ORAL
  Filled 2012-09-19: qty 1

## 2012-09-19 NOTE — Progress Notes (Addendum)
Monica Khan MRN: [id number] DOB/AGE: 05/01/1945 67 y.o.  PCP: No primary provider on file.   Admit date: 09/16/2012 Discharge date: 09/19/2012  Discharge Diagnoses:      Transaminitis Active Problems:   Hepatocellular injury   Nausea and vomiting   Shingles   History of breast cancer  Followup CMP in 1 week Follow up with PCP in 1 week If liver function still elevated the patient can follow up with Dr.ganem in 2-3 weeks   Medication List    STOP taking these medications       acetaminophen 325 MG tablet  Commonly known as:  TYLENOL     valACYclovir 1000 MG tablet  Commonly known as:  VALTREX      TAKE these medications       azelastine 137 MCG/SPRAY nasal spray  Commonly known as:  ASTELIN  Place 1 spray into the nose 2 (two) times daily as needed for rhinitis.     meperidine 50 MG tablet  Commonly known as:  DEMEROL  Take 50-100 mg by mouth every 4 (four) hours as needed for pain.     omeprazole 40 MG capsule  Commonly known as:  PRILOSEC  Take 40 mg by mouth daily.     temazepam 30 MG capsule  Commonly known as:  RESTORIL  Take 30 mg by mouth at bedtime as needed for sleep.     traZODone 50 MG tablet  Commonly known as:  DESYREL  Take 50 mg by mouth at bedtime as needed for sleep or depression.     warfarin 4 MG tablet  Commonly known as:  COUMADIN  Take 4-6 mg by mouth daily with breakfast. Takes 4mg  for 2 days and takes 6mg  on day 3; then repeats the cycle.        Discharge Condition: Stable  Disposition: Final discharge disposition not confirmed   Consults:  GI    Significant Diagnostic Studies: Ct Abdomen Pelvis Wo Contrast  09/16/2012   *RADIOLOGY REPORT*  Clinical Data: Right flank pain  CT ABDOMEN AND PELVIS WITHOUT CONTRAST  Technique:  Multidetector CT imaging of the abdomen and pelvis was performed following the standard protocol without intravenous contrast.  Comparison: None.  Findings: No urinary calculus.  No  hydronephrosis.  Post cholecystectomy clips.  Pneumobilia in the liver is most likely related to biliary instrumentation.  Spleen, pancreas, adrenal glands are grossly within normal limits.  Prominent stool burden in the ascending and transverse colon.  IMPRESSION: Postoperative changes.  No evidence of urinary obstruction.  Prominent stool burden in the proximal colon.   Original Report Authenticated By: Jolaine Click, M.D.   Mr 3d Recon At Scanner  09/18/2012   *RADIOLOGY REPORT*  Clinical Data:  Abnormal LFTs  MRI ABDOMEN WITHOUT AND WITH CONTRAST (MRCP)  Technique:  Multiplanar multisequence MR imaging of the abdomen was performed without and with contrast, including heavily T2-weighted images of the biliary and pancreatic ducts.  Three-dimensional MR images were rendered by post processing of the original MR data.  Contrast: 8mL MULTIHANCE GADOBENATE DIMEGLUMINE 529 MG/ML IV SOLN  Comparison:  CT scan from 09/16/2012  Findings:  Study is motion degraded. 6 mm simple cyst identified in the left liver, otherwise focal abnormalities seen in the liver parenchyma.  There is no intrahepatic biliary duct dilatation. Extrahepatic common duct measures 5 mm in diameter.  The common bile duct in the head of the pancreas measures 6 mm in diameter. No filling defect within the extrahepatic biliary system  to suggest common duct stone.  There is no pancreatic ductal dilatation.  The spleen, stomach, duodenum, pancreas, and adrenal glands are unremarkable.  No focal abnormalities seen in either kidney.  10 mm simple cyst is identified in the lower pole of the left kidney.  No abdominal aortic aneurysm.  There is no free fluid or lymphadenopathy visible in the abdomen.  No marrow signal abnormality within the visualized skeleton.  IMPRESSION: Unremarkable study.  No findings to explain the patient's history of abnormal LFTs.   Original Report Authenticated By: Kennith Center, M.D.   Mr Abd W/wo Cm/mrcp  09/18/2012    *RADIOLOGY REPORT*  Clinical Data:  Abnormal LFTs  MRI ABDOMEN WITHOUT AND WITH CONTRAST (MRCP)  Technique:  Multiplanar multisequence MR imaging of the abdomen was performed without and with contrast, including heavily T2-weighted images of the biliary and pancreatic ducts.  Three-dimensional MR images were rendered by post processing of the original MR data.  Contrast: 8mL MULTIHANCE GADOBENATE DIMEGLUMINE 529 MG/ML IV SOLN  Comparison:  CT scan from 09/16/2012  Findings:  Study is motion degraded. 6 mm simple cyst identified in the left liver, otherwise focal abnormalities seen in the liver parenchyma.  There is no intrahepatic biliary duct dilatation. Extrahepatic common duct measures 5 mm in diameter.  The common bile duct in the head of the pancreas measures 6 mm in diameter. No filling defect within the extrahepatic biliary system to suggest common duct stone.  There is no pancreatic ductal dilatation.  The spleen, stomach, duodenum, pancreas, and adrenal glands are unremarkable.  No focal abnormalities seen in either kidney.  10 mm simple cyst is identified in the lower pole of the left kidney.  No abdominal aortic aneurysm.  There is no free fluid or lymphadenopathy visible in the abdomen.  No marrow signal abnormality within the visualized skeleton.  IMPRESSION: Unremarkable study.  No findings to explain the patient's history of abnormal LFTs.   Original Report Authenticated By: Kennith Center, M.D.       Microbiology: No results found for this or any previous visit (from the past 240 hour(s)).   Labs: Results for orders placed during the hospital encounter of 09/16/12 (from the past 48 hour(s))  COMPREHENSIVE METABOLIC PANEL     Status: Abnormal   Collection Time    09/18/12  4:31 AM      Result Value Range   Sodium 136  135 - 145 mEq/L   Potassium 3.3 (*) 3.5 - 5.1 mEq/L   Chloride 99  96 - 112 mEq/L   CO2 21  19 - 32 mEq/L   Glucose, Bld 61 (*) 70 - 99 mg/dL   BUN 12  6 - 23 mg/dL    Creatinine, Ser 4.09  0.50 - 1.10 mg/dL   Calcium 8.5  8.4 - 81.1 mg/dL   Total Protein 5.7 (*) 6.0 - 8.3 g/dL   Albumin 3.2 (*) 3.5 - 5.2 g/dL   AST 914 (*) 0 - 37 U/L   ALT 358 (*) 0 - 35 U/L   Alkaline Phosphatase 118 (*) 39 - 117 U/L   Total Bilirubin 1.1  0.3 - 1.2 mg/dL   GFR calc non Af Amer >90  >90 mL/min   GFR calc Af Amer >90  >90 mL/min   Comment:            The eGFR has been calculated     using the CKD EPI equation.     This calculation has not been  validated in all clinical     situations.     eGFR's persistently     <90 mL/min signify     possible Chronic Kidney Disease.  MAGNESIUM     Status: None   Collection Time    09/18/12  4:31 AM      Result Value Range   Magnesium 2.1  1.5 - 2.5 mg/dL     HPI :  67 year old female who presented to the hospital emergency room with complaints of right upper quadrant sharp abdominal pain which started yesterday. She had some nausea and vomiting associated with this. She was found to have elevated liver enzymes. Total bilirubin on presentation was 1.1 alkaline phosphatase 113, AST 839 and ALT 378. She gives no history of hepatitis. She did have her gallbladder removed years ago. A CT scan of the abdomen was done but does not reveal any specific reason for this pain to be present. There was some pneumobilia. I asked her if she ever recalls having had an ERCP with sphincterotomy after her gallbladder was removed years ago but she does not recall.  HOSPITAL COURSE: 1. Transaminitis - with a hepatocellular pattern, could be due to tylenol use, but levels negative  . Despite the > 2:1 AST:ALT ratio, the patient denies any EtOH use what so ever, last drink was a few oz of wine when her husband died 2 years ago, nothing since then, never has been a heavy drinker. MRCP negative .  liver studies including acute hepatitis panel negative , autoimmune pending,   EBV negative . CT scan of liver didn't show any suspicious lesions. GI not  sure of the etiology, LFT's improving 2. Fever , will repeat CXR ,BC ,and UA , low BP, therefore getting a bolus , could this be an autoimmune process ? 3. Shingles continue Valtrex for 3 more days then DC , no visible rash except couple of pustules  4. N/V - zofran 5. Anticoagulation ,continue coumadin,     Blood pressure 105/52, pulse 82, temperature 98.1 F (36.7 C), temperature source Oral, resp. rate 18, height 4\' 11"  (1.499 m), weight 37.195 kg (82 lb), SpO2 94.00%.   Discharge Exam: Nonicteric  Heart regular rhythm no murmurs  Lungs clear  Abdomen: Bowel sounds are present, it is soft, she does complain of some discomfort in the right upper quadrant. I do not see any outbreak of shingles in this region.      SignedRicharda Overlie 09/19/2012, 9:16 AM

## 2012-09-19 NOTE — Progress Notes (Signed)
Talked with infection control about patient contact status because there were no rash, blisters, or redness noted on assessment today. I was told that it would be safe to take her off of precautions if there were no shingles symptoms present.

## 2012-09-20 DIAGNOSIS — E43 Unspecified severe protein-calorie malnutrition: Secondary | ICD-10-CM

## 2012-09-20 DIAGNOSIS — Z853 Personal history of malignant neoplasm of breast: Secondary | ICD-10-CM

## 2012-09-20 LAB — CBC
HCT: 34.9 % — ABNORMAL LOW (ref 36.0–46.0)
Platelets: 165 10*3/uL (ref 150–400)
RDW: 13.2 % (ref 11.5–15.5)
WBC: 4.4 10*3/uL (ref 4.0–10.5)

## 2012-09-20 LAB — PROTIME-INR
INR: 1.06 (ref 0.00–1.49)
Prothrombin Time: 13.6 seconds (ref 11.6–15.2)

## 2012-09-20 LAB — COMPREHENSIVE METABOLIC PANEL
AST: 45 U/L — ABNORMAL HIGH (ref 0–37)
BUN: 9 mg/dL (ref 6–23)
CO2: 25 mEq/L (ref 19–32)
Calcium: 8.9 mg/dL (ref 8.4–10.5)
Creatinine, Ser: 0.47 mg/dL — ABNORMAL LOW (ref 0.50–1.10)
GFR calc non Af Amer: >90 mL/min (ref >90)

## 2012-09-20 LAB — HIV ANTIBODY (ROUTINE TESTING W REFLEX): HIV: NONREACTIVE

## 2012-09-20 LAB — CK: Total CK: 34 U/L (ref 7–177)

## 2012-09-20 LAB — FERRITIN: Ferritin: 158 ng/mL (ref 10–291)

## 2012-09-20 MED ORDER — KETOROLAC TROMETHAMINE 15 MG/ML IJ SOLN
15.0000 mg | Freq: Four times a day (QID) | INTRAMUSCULAR | Status: DC | PRN
  Administered 2012-09-21 (×2): 15 mg via INTRAVENOUS
  Filled 2012-09-20 (×2): qty 1

## 2012-09-20 MED ORDER — KETOROLAC TROMETHAMINE 30 MG/ML IJ SOLN
30.0000 mg | Freq: Once | INTRAMUSCULAR | Status: AC
  Administered 2012-09-20: 30 mg via INTRAVENOUS
  Filled 2012-09-20: qty 1

## 2012-09-20 MED ORDER — WARFARIN SODIUM 6 MG PO TABS
6.0000 mg | ORAL_TABLET | Freq: Once | ORAL | Status: AC
  Administered 2012-09-20: 6 mg via ORAL
  Filled 2012-09-20: qty 1

## 2012-09-20 NOTE — Consult Note (Signed)
[facility]    Date of Admission:  09/16/2012  Date of Consult:  09/20/2012  Reason for Consult: FUO Referring Physician: Dr. Richarda Overlie   HPI: Monica Khan is an 67 y.o. female. with history of breast cancer approx. 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 She says the pain was so intense it caused her to have 2 episodes of nausea and vomiting. The vomitus was not bile colored nor bloody. She recently had another episode of shingles last week and has been taking her acyclovir at home PTA. She endorses a similar episode of this in early 2013 where she received outpatient treatment from her [place] PCP. She notes that she has been having night sweats and has lost 20 pounds over the past 2 years. Her husband recently passed about 1 year ago as well and she has felt very lonely since. She also has a history of TB for which she was treated for this when she was 67 years old. She denies neck stiffness but says she does have pains in her neck attributed to her chemo many years ago.    Past Medical History  Diagnosis Date  . Breast cancer     In remission  . Shingles     Recurrent  . Gastritis   . Peripheral neuropathy   . Migraines     Past Surgical History  Procedure Laterality Date  . Total mastectomy    . Appendectomy    . Cholecystectomy    . Abdominal hysterectomy    ergies:   Allergies  Allergen Reactions  . Cephalosporins Anaphylaxis  . Penicillins Anaphylaxis  . Codeine     sickl  . Compazine [Prochlorperazine Edisylate]     Throat swells  . Morphine And Related     Nausea and vomiting     Medications: I have  reviewed patients current medications as documented in Epic Anti-infectives   Start     Dose/Rate Route Frequency Ordered Stop   09/17/12 1130  valACYclovir (VALTREX) tablet 1,000 mg     1,000 mg Oral 3 times daily 09/17/12 1127        Social History:  reports that she has never smoked. She does not have any smokeless tobacco history on file. She reports that she does not drink alcohol or use illicit drugs.  Family History  Problem Relation Age of Onset  .       As in HPI and primary teams notes otherwise 12 point review of systems is negative  Blood pressure 94/42, pulse 75, temperature 97.9 F (36.6 C), temperature source Oral, resp. rate 16, height 4\' 11"  (1.499 m), weight 37.195 kg (82 lb), SpO2 98.00%. General: Alert and awake, oriented x3, not in any acute distress. HEENT: anicteric sclera, pupils reactive to light and accommodation, EOMI, oropharynx clear and without exudate, no lymphadenopathy CVS regular rate, normal r,  no murmur rubs or gallops Chest: clear to auscultation bilaterally, no wheezing, rales or rhonchi Abdomen: soft nontender, nondistended, normal bowel sounds Extremities: no  clubbing or edema noted bilaterally Skin: no rashes Neuro: nonfocal, strength and sensation intact   Results for orders placed during the hospital encounter of 09/16/12 (  from the past 48 hour(s))  COMPREHENSIVE METABOLIC PANEL     Status: Abnormal   Collection Time    09/19/12 10:50 AM      Result Value Range   Sodium 140  135 - 145 mEq/L   Potassium 3.3 (*) 3.5 - 5.1 mEq/L   Chloride 103  96 - 112 mEq/L   CO2 30  19 - 32 mEq/L   Glucose, Bld 100 (*) 70 - 99 mg/dL   BUN 13  6 - 23 mg/dL   Creatinine, Ser 1.61  0.50 - 1.10 mg/dL   Calcium 8.8  8.4 - 09.6 mg/dL   Total Protein 6.1  6.0 - 8.3 g/dL   Albumin 3.1 (*) 3.5 - 5.2 g/dL   AST 60 (*) 0 - 37 U/L   ALT 227 (*) 0 - 35 U/L   Alkaline Phosphatase 109  39 - 117 U/L   Total Bilirubin 0.7  0.3 - 1.2 mg/dL   GFR calc non Af  Amer >90  >90 mL/min   GFR calc Af Amer >90  >90 mL/min   Comment:            The eGFR has been calculated     using the CKD EPI equation.     This calculation has not been     validated in all clinical     situations.     eGFR's persistently     <90 mL/min signify     possible Chronic Kidney Disease.  PROTIME-INR     Status: Abnormal   Collection Time    09/19/12 10:50 AM      Result Value Range   Prothrombin Time 17.1 (*) 11.6 - 15.2 seconds   INR 1.43  0.00 - 1.49  URINALYSIS, ROUTINE W REFLEX MICROSCOPIC     Status: Abnormal   Collection Time    09/19/12 12:27 PM      Result Value Range   Color, Urine AMBER (*) YELLOW   Comment: BIOCHEMICALS MAY BE AFFECTED BY COLOR   APPearance CLEAR  CLEAR   Specific Gravity, Urine 1.021  1.005 - 1.030   pH 6.0  5.0 - 8.0   Glucose, UA NEGATIVE  NEGATIVE mg/dL   Hgb urine dipstick MODERATE (*) NEGATIVE   Bilirubin Urine SMALL (*) NEGATIVE   Ketones, ur 15 (*) NEGATIVE mg/dL   Protein, ur NEGATIVE  NEGATIVE mg/dL   Urobilinogen, UA 1.0  0.0 - 1.0 mg/dL   Nitrite NEGATIVE  NEGATIVE   Leukocytes, UA TRACE (*) NEGATIVE  URINE MICROSCOPIC-ADD ON     Status: None   Collection Time    09/19/12 12:27 PM      Result Value Range   Squamous Epithelial / LPF RARE  RARE   WBC, UA 0-2  <3 WBC/hpf   RBC / HPF 11-20  <3 RBC/hpf   Bacteria, UA RARE  RARE   Urine-Other MUCOUS PRESENT    COMPREHENSIVE METABOLIC PANEL     Status: Abnormal   Collection Time    09/20/12  7:45 AM      Result Value Range   Sodium 143  135 - 145 mEq/L   Potassium 3.5  3.5 - 5.1 mEq/L   Chloride 108  96 - 112 mEq/L   CO2 25  19 - 32 mEq/L   Glucose, Bld 103 (*) 70 - 99 mg/dL   BUN 9  6 - 23 mg/dL   Creatinine, Ser 0.45 (*) 0.50 - 1.10 mg/dL   Calcium 8.9  8.4 - 10.5 mg/dL   Total Protein 5.8 (*) 6.0 - 8.3 g/dL   Albumin 3.0 (*) 3.5 - 5.2 g/dL   AST 45 (*) 0 - 37 U/L   ALT 168 (*) 0 - 35 U/L   Alkaline Phosphatase 107  39 - 117 U/L   Total Bilirubin 0.4  0.3 -  1.2 mg/dL   GFR calc non Af Amer >90  >90 mL/min   GFR calc Af Amer >90  >90 mL/min   Comment:            The eGFR has been calculated     using the CKD EPI equation.     This calculation has not been     validated in all clinical     situations.     eGFR's persistently     <90 mL/min signify     possible Chronic Kidney Disease.  PROTIME-INR     Status: None   Collection Time    09/20/12  9:25 AM      Result Value Range   Prothrombin Time 13.6  11.6 - 15.2 seconds   INR 1.06  0.00 - 1.49  CBC     Status: Abnormal   Collection Time    09/20/12  9:25 AM      Result Value Range   WBC 4.4  4.0 - 10.5 K/uL   RBC 3.90  3.87 - 5.11 MIL/uL   Hemoglobin 11.8 (*) 12.0 - 15.0 g/dL   HCT 40.9 (*) 81.1 - 91.4 %   MCV 89.5  78.0 - 100.0 fL   MCH 30.3  26.0 - 34.0 pg   MCHC 33.8  30.0 - 36.0 g/dL   RDW 78.2  95.6 - 21.3 %   Platelets 165  150 - 400 K/uL   No results found for this basename: sdes, specrequest, cult, reptstatus   Dg Chest 2 View  09/19/2012   *RADIOLOGY REPORT*  Clinical Data: Fever.  Breast cancer.  CHEST - 2 VIEW  Comparison: 09/16/2012; 11/20/2011  Findings: There is evidence of bilateral prior mastectomy and bilateral axillary dissections.  Biapical pleuroparenchymal scarring in the lungs.  Lungs appear otherwise clear.  No pleural effusion noted. Cardiac and mediastinal contours appear unremarkable.  IMPRESSION:  1.  No acute findings to explain the patient's fever.   Original Report Authenticated By: Gaylyn Rong, M.D.     No results found for this or any previous visit (from the past 720 hour(s)).   Impression/Recommendation Ms. Cashae Weich is a 67 yo female with elevated LFTs on admission with RUQ pain, chronic acyclovir use for shingles and a negative workup for liver disease including MRCP to r/o choledocolithiasis, CT abd/pelvis and MRI to r/o bile duct obstruction. Her labs are notable for an elevated GGT as well as a >2:1 AST:ALT ratio on admission plus  negative serologies for acute hepatitis. Her transaminases have trended down since admission.  Very concerning her reason for Coumadin therapy as she is unable to provide sufficient history for her continued use of this. As zoster has been known to cause episodes of hepatitis such as this, it is highly likely especially given her continuous episodes of zoster especially in the last 2 years. An FUO workup will nevertheless be initiated.  FUO: --unrelated, but feel an echo is warranted in this case given her Coumadin use and reason for therapy --CRP, CK, CMV ultraquant pcr, CMV Ab, EBV panel, Ferritin, HepB DNA pcr, HIV rna, LDH, RF, RPR --she does not wish  for a CT chest currently which would round out an FUO workup, so will hold off on this for now and consider it pending her results --blood cx   Thank you so much for this interesting consult  [facility] [phone] (pager) [phone] (office) 09/20/2012, 12:26 PM  Lewie Chamber 09/20/2012, 12:26 PM

## 2012-09-20 NOTE — Progress Notes (Signed)
ANTICOAGULATION CONSULT NOTE - Follow Up Consult  Pharmacy Consult for warfarin Indication: history of clots  Allergies  Allergen Reactions  . Cephalosporins Anaphylaxis  . Penicillins Anaphylaxis  . Codeine     sickl  . Compazine [Prochlorperazine Edisylate]     Throat swells  . Morphine And Related     Nausea and vomiting    Patient Measurements: Height: 4\' 11"  (149.9 cm) Weight: 82 lb (37.195 kg) IBW/kg (Calculated) : 43.2   Vital Signs: Temp: 97.9 F (36.6 C) (08/13 0559) Temp src: Oral (08/13 0404) BP: 94/42 mmHg (08/13 0559) Pulse Rate: 75 (08/13 0559)  Labs:  Recent Labs  09/18/12 0431 09/19/12 1050 09/20/12 0745 09/20/12 0925  HGB  --   --   --  11.8*  HCT  --   --   --  34.9*  PLT  --   --   --  165  LABPROT  --  17.1*  --  13.6  INR  --  1.43  --  1.06  CREATININE 0.50 0.54 0.47*  --     Estimated Creatinine Clearance: 40.6 ml/min (by C-G formula based on Cr of 0.47).   Assessment: 47 YOF with history of clots who was on warfarin PTA with a dose of 4mg  x2 days, then 6mg  x1 day, then repeats the cycle. LFTs were elevated on admission and warfarin was held. Restarted yesterday with a dose of 4mg . Note: patient last took her warfarin at home on Saturday 8/9 with a 4mg  dose which was the 2nd 4mg  dose of the cycle. Her INR fell to 1.06 this morning from 1.47 yesterday and 2.43 on 8/11. No bleeding noted.  Goal of Therapy:  INR 2-3 Monitor platelets by anticoagulation protocol: Yes   Plan:  1. Warfarin 6mg  po x1 tonight 2. If patient is discharged today, would recommend her continuing with 6mg  daily and having her INR checked on Friday or Monday at the latest 3. Daily PT/INR while in the hospital 4. Follow up for signs of bleeding or clotting  Lyah Millirons D. Lachanda Buczek, PharmD Clinical Pharmacist Pager: [phone] 09/20/2012 10:56 AM

## 2012-09-20 NOTE — Consult Note (Signed)
INFECTIOUS DISEASE ATTENDING ADDENDUM:     [facility]   Date: 09/20/2012  Patient name: Monica Khan  Medical record number: [id number]  Date of birth: 07-Feb-1946    This patient has been seen and discussed with the house staff. Please see their note for complete details. I concur with their findings with the following additions/corrections:  Fascinating patient with apparent recurrent zoster outbreaks and who may have in fact had CNS vasculitic and CVA 's as a consequence of these recurrent infections and prior history of an episode of zoster accompanied by hepatitis for which she was hospitalized in [facility]. At the time her MD's there apparently thought she had developed hepatitis due to an antibacterial abx--but which could certainly have been due to the VZV virus itself.   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  --I THINK CONTINUING RX With VALTREX 1 G po TID to finish 14 days total therapy is indicated, followed by 1gram valtrex daily try to prevent or reduce frequency of recurrences  Certainly there are other ID causes of fever and hepatitis and we see that her acute hepatitis panel is negative and her eBV vcapside IgM also negative.   For thoroughness we will also get Hep C RNA, Hep B DNA, EBV panel abs, CMV abs, and CMV VL, RPR   There is also possibility that her fevers are unrelated to her zoster eruption and hepatitis and I will order:  BLood cultures x2 HIV RNA ESR, CRP, LDH, CPK, ACE level, ferritin,   She has known hx of rx Pulmnary TB so will not get QF gold   She has already had CT abd , MRI abd, MRCP  Will consider CT chest in am if she is still febrile  Will also get an TEE as part of FUO workup and to try also to elucidate reason she is on coumadin (does she have dilated  atria? And PAF, or AF  I spent greater than 60 minutes with the patient including greater than 50% of time in face to face counsel of the patient and in coordination of their care.     Acey Lav 09/20/2012, 5:20 PM

## 2012-09-20 NOTE — Progress Notes (Signed)
Patient ID: Monica Khan  female  ZOX:[id number]    DOB: 1945-10-09    DOA: 09/16/2012  PCP: No primary provider on file.  Assessment/Plan: Principal Problem:   Transaminitis Active Problems:   Hepatocellular injury   Nausea and vomiting   Shingles   History of breast cancer   Protein-calorie malnutrition, severe  1. Transaminitis - with a hepatocellular pattern, could be due to tylenol use, but levels negative -  Despite the > 2:1 AST:ALT ratio, the patient denies any EtOH use, last drink was a few oz of wine when her husband died 2 years ago, nothing since then, never has been a heavy drinker. MRCP negative -  acute hepatitis panel negative, autoimmune pending, EBV negative -  CT scan of liver didn't show any suspicious lesions. GI not sure of the etiology, LFT's improving - MRI abdomen was unremarkable 2. Fever: Of unclear etiology? Autoimmune process - chest x-ray negative for any pneumonia , UA negative for any UTI - no rashes or lesions. She was on Valtrex prior to admission, no visible rashes - ID consult obtained, will await Dr. Lattie Haw recommendations 3. Shingles: Patient was on Valtrex prior to admission, no visible rash except couple of pustules  4. N/V - zofran 5. 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  6. Severe protein calorie malnutrition: Continue Ensure nutritional supplements  DVT Prophylaxis: on coumadin  Code Status:  Disposition:    Subjective: Perplexed why she keep getting fever at night, nausea  Objective: Weight change:   Intake/Output Summary (Last 24 hours) at 09/20/12 1333 Last data filed at 09/19/12 2300  Gross per 24 hour  Intake    400 ml  Output      0 ml  Net    400 ml   Blood pressure 94/42, pulse 75, temperature 97.9 F (36.6 C), temperature source Oral, resp. rate 16, height 4\' 11"  (1.499 m), weight 37.195 kg (82 lb), SpO2 98.00%.  Physical  Exam: General: Alert and awake, oriented x3, not in any acute distress. CVS: S1-S2 clear, no murmur rubs or gallops Chest: clear to auscultation bilaterally, no wheezing, rales or rhonchi Abdomen: soft nontender, nondistended, normal bowel sounds. Small pustules type rash on the abd   Extremities: no cyanosis, clubbing or edema noted bilaterally Neuro: Cranial nerves II-XII intact, no focal neurological deficits  Lab Results: Basic Metabolic Panel:  Recent Labs Lab 09/18/12 0431 09/19/12 1050 09/20/12 0745  NA 136 140 143  K 3.3* 3.3* 3.5  CL 99 103 108  CO2 21 30 25   GLUCOSE 61* 100* 103*  BUN 12 13 9   CREATININE 0.50 0.54 0.47*  CALCIUM 8.5 8.8 8.9  MG 2.1  --   --    Liver Function Tests:  Recent Labs Lab 09/19/12 1050 09/20/12 0745  AST 60* 45*  ALT 227* 168*  ALKPHOS 109 107  BILITOT 0.7 0.4  PROT 6.1 5.8*  ALBUMIN 3.1* 3.0*    Recent Labs Lab 09/16/12 1845  LIPASE 28   No results found for this basename: AMMONIA,  in the last 168 hours CBC:  Recent Labs Lab 09/16/12 1845 09/17/12 0730 09/20/12 0925  WBC 7.4 6.8 4.4  NEUTROABS 5.9  --   --   HGB 12.7 12.7 11.8*  HCT 38.3 36.6 34.9*  MCV 91.4 89.1 89.5  PLT 195 185 165   Cardiac Enzymes: No results found for this basename: CKTOTAL, CKMB, CKMBINDEX, TROPONINI,  in the  last 168 hours BNP: No components found with this basename: POCBNP,  CBG: No results found for this basename: GLUCAP,  in the last 168 hours   Micro Results: No results found for this or any previous visit (from the past 240 hour(s)).  Studies/Results: Ct Abdomen Pelvis Wo Contrast  09/16/2012   *RADIOLOGY REPORT*  Clinical Data: Right flank pain  CT ABDOMEN AND PELVIS WITHOUT CONTRAST  Technique:  Multidetector CT imaging of the abdomen and pelvis was performed following the standard protocol without intravenous contrast.  Comparison: None.  Findings: No urinary calculus.  No hydronephrosis.  Post cholecystectomy clips.   Pneumobilia in the liver is most likely related to biliary instrumentation.  Spleen, pancreas, adrenal glands are grossly within normal limits.  Prominent stool burden in the ascending and transverse colon.  IMPRESSION: Postoperative changes.  No evidence of urinary obstruction.  Prominent stool burden in the proximal colon.   Original Report Authenticated By: Jolaine Click, M.D.   Dg Chest 2 View  09/19/2012   *RADIOLOGY REPORT*  Clinical Data: Fever.  Breast cancer.  CHEST - 2 VIEW  Comparison: 09/16/2012; 11/20/2011  Findings: There is evidence of bilateral prior mastectomy and bilateral axillary dissections.  Biapical pleuroparenchymal scarring in the lungs.  Lungs appear otherwise clear.  No pleural effusion noted. Cardiac and mediastinal contours appear unremarkable.  IMPRESSION:  1.  No acute findings to explain the patient's fever.   Original Report Authenticated By: Gaylyn Rong, M.D.   Mr 3d Recon At Scanner  09/18/2012   *RADIOLOGY REPORT*  Clinical Data:  Abnormal LFTs  MRI ABDOMEN WITHOUT AND WITH CONTRAST (MRCP)  Technique:  Multiplanar multisequence MR imaging of the abdomen was performed without and with contrast, including heavily T2-weighted images of the biliary and pancreatic ducts.  Three-dimensional MR images were rendered by post processing of the original MR data.  Contrast: 8mL MULTIHANCE GADOBENATE DIMEGLUMINE 529 MG/ML IV SOLN  Comparison:  CT scan from 09/16/2012  Findings:  Study is motion degraded. 6 mm simple cyst identified in the left liver, otherwise focal abnormalities seen in the liver parenchyma.  There is no intrahepatic biliary duct dilatation. Extrahepatic common duct measures 5 mm in diameter.  The common bile duct in the head of the pancreas measures 6 mm in diameter. No filling defect within the extrahepatic biliary system to suggest common duct stone.  There is no pancreatic ductal dilatation.  The spleen, stomach, duodenum, pancreas, and adrenal glands are  unremarkable.  No focal abnormalities seen in either kidney.  10 mm simple cyst is identified in the lower pole of the left kidney.  No abdominal aortic aneurysm.  There is no free fluid or lymphadenopathy visible in the abdomen.  No marrow signal abnormality within the visualized skeleton.  IMPRESSION: Unremarkable study.  No findings to explain the patient's history of abnormal LFTs.   Original Report Authenticated By: Kennith Center, M.D.   Mr Abd W/wo Cm/mrcp  09/18/2012   *RADIOLOGY REPORT*  Clinical Data:  Abnormal LFTs  MRI ABDOMEN WITHOUT AND WITH CONTRAST (MRCP)  Technique:  Multiplanar multisequence MR imaging of the abdomen was performed without and with contrast, including heavily T2-weighted images of the biliary and pancreatic ducts.  Three-dimensional MR images were rendered by post processing of the original MR data.  Contrast: 8mL MULTIHANCE GADOBENATE DIMEGLUMINE 529 MG/ML IV SOLN  Comparison:  CT scan from 09/16/2012  Findings:  Study is motion degraded. 6 mm simple cyst identified in the left liver, otherwise focal abnormalities seen in  the liver parenchyma.  There is no intrahepatic biliary duct dilatation. Extrahepatic common duct measures 5 mm in diameter.  The common bile duct in the head of the pancreas measures 6 mm in diameter. No filling defect within the extrahepatic biliary system to suggest common duct stone.  There is no pancreatic ductal dilatation.  The spleen, stomach, duodenum, pancreas, and adrenal glands are unremarkable.  No focal abnormalities seen in either kidney.  10 mm simple cyst is identified in the lower pole of the left kidney.  No abdominal aortic aneurysm.  There is no free fluid or lymphadenopathy visible in the abdomen.  No marrow signal abnormality within the visualized skeleton.  IMPRESSION: Unremarkable study.  No findings to explain the patient's history of abnormal LFTs.   Original Report Authenticated By: Kennith Center, M.D.    Medications: Scheduled  Meds: . feeding supplement  1 Container Oral TID BM  . LORazepam  1 mg Intravenous Once  . pantoprazole  80 mg Oral Daily  . traZODone  50 mg Oral QHS  . valACYclovir  1,000 mg Oral TID  . warfarin  6 mg Oral ONCE-1800  . Warfarin - Pharmacist Dosing Inpatient   Does not apply q1800      LOS: 4 days   Gedalya Jim M.D. [facility] 09/20/2012, 1:33 PM Pager: [phone]  If 7PM-7AM, please contact night-coverage [email or web address] Password [email or web address]

## 2012-09-21 DIAGNOSIS — B028 Zoster with other complications: Secondary | ICD-10-CM

## 2012-09-21 DIAGNOSIS — I519 Heart disease, unspecified: Secondary | ICD-10-CM

## 2012-09-21 DIAGNOSIS — K759 Inflammatory liver disease, unspecified: Secondary | ICD-10-CM

## 2012-09-21 DIAGNOSIS — R509 Fever, unspecified: Secondary | ICD-10-CM

## 2012-09-21 LAB — CBC
HCT: 33.3 % — ABNORMAL LOW (ref 36.0–46.0)
Hemoglobin: 11.2 g/dL — ABNORMAL LOW (ref 12.0–15.0)
MCH: 30.1 pg (ref 26.0–34.0)
MCV: 89.5 fL (ref 78.0–100.0)
Platelets: 176 10*3/uL (ref 150–400)
RBC: 3.72 MIL/uL — ABNORMAL LOW (ref 3.87–5.11)
WBC: 5.3 10*3/uL (ref 4.0–10.5)

## 2012-09-21 LAB — EPSTEIN-BARR VIRUS VCA ANTIBODY PANEL
EBV EA IgG: 7.4 U/mL (ref <9.0)
EBV NA IgG: 202 U/mL — ABNORMAL HIGH (ref <18.0)
EBV VCA IgM: <10 U/mL (ref <36.0)

## 2012-09-21 MED ORDER — PROMETHAZINE HCL 12.5 MG PO TABS
12.5000 mg | ORAL_TABLET | Freq: Four times a day (QID) | ORAL | Status: DC | PRN
  Administered 2012-09-21 – 2012-09-22 (×4): 12.5 mg via ORAL
  Filled 2012-09-21 (×4): qty 1

## 2012-09-21 MED ORDER — WARFARIN SODIUM 7.5 MG PO TABS
7.5000 mg | ORAL_TABLET | Freq: Once | ORAL | Status: AC
  Administered 2012-09-21: 7.5 mg via ORAL
  Filled 2012-09-21 (×2): qty 1

## 2012-09-21 NOTE — Progress Notes (Signed)
ANTICOAGULATION CONSULT NOTE - Follow Up Consult  Pharmacy Consult for warfarin Indication: history of clots  Allergies  Allergen Reactions  . Cephalosporins Anaphylaxis  . Penicillins Anaphylaxis  . Codeine     sickl  . Compazine [Prochlorperazine Edisylate]     Throat swells  . Morphine And Related     Nausea and vomiting    Patient Measurements: Height: 4\' 11"  (149.9 cm) Weight: 82 lb (37.195 kg) IBW/kg (Calculated) : 43.2   Vital Signs: Temp: 98.8 F (37.1 C) (08/14 0700) Temp src: Oral (08/14 0700) BP: 101/54 mmHg (08/14 0700) Pulse Rate: 93 (08/14 0700)  Labs:  Recent Labs  09/19/12 1050 09/20/12 0745 09/20/12 0925 09/20/12 1600 09/21/12 0510  HGB  --   --  11.8*  --  11.2*  HCT  --   --  34.9*  --  33.3*  PLT  --   --  165  --  176  LABPROT 17.1*  --  13.6  --  14.6  INR 1.43  --  1.06  --  1.16  CREATININE 0.54 0.47*  --   --   --   CKTOTAL  --   --   --  34  --     Estimated Creatinine Clearance: 40.6 ml/min (by C-G formula based on Cr of 0.47).   Assessment: 61 YOF with history of clots who was on warfarin PTA with a dose of 4mg  x2 days, then 6mg  x1 day, then repeats the cycle. LFTs were elevated on admission and warfarin was held. Warfarin was restarted 8/12 and patient has received a 4mg  dose and a 6mg  dose. INR this morning is 1.16- coming up slower than anticipated as her INR decreased quickly when warfarin was held. CBC is stable, no bleeding noted. Patient was counseled yesterday on her warfarin.  Goal of Therapy:  INR 2-3 Monitor platelets by anticoagulation protocol: Yes   Plan:  1. Warfarin 7.5mg  po x1 tonight 2. If patient is discharged today, would recommend her continuing with 6mg  daily (since she has these tablets at home) and having her INR checked on Monday 3. Daily PT/INR while in the hospital 4. Follow up for signs of bleeding or clotting  Jermarcus Mcfadyen D. Annah Jasko, PharmD Clinical Pharmacist Pager: [phone] 09/21/2012 12:47 PM

## 2012-09-21 NOTE — Progress Notes (Signed)
Patient ID: Monica Khan  female  GEX:[id number]    DOB: July 24, 1945    DOA: 09/16/2012  PCP: No primary provider on file.  Assessment/Plan: Principal Problem:   Transaminitis Active Problems:   Hepatocellular injury   Nausea and vomiting   Shingles   History of breast cancer   Protein-calorie malnutrition, severe  1. Transaminitis - with a hepatocellular pattern, could be due to tylenol use, but levels negative -  Despite the > 2:1 AST:ALT ratio, the patient denies any EtOH use, last drink was a few oz of wine when her husband died 2 years ago, nothing since then, never has been a heavy drinker. MRCP negative -  acute hepatitis panel negative, autoimmune pending, EBV negative -  CT scan of liver didn't show any suspicious lesions. GI not sure of the etiology, LFT's improving - MRI abdomen was unremarkable 2. Fever: Of unclear etiology? Autoimmune process - chest x-ray negative for any pneumonia , UA negative for any UTI - no rashes or lesions. She was on Valtrex prior to admission, no visible rashes - ID consult obtained, w/u negative so far, reviewed Dr. Lattie Haw recommendations  - 2-D echo ordered today 3. Shingles: Patient was on Valtrex prior to admission, no visible rash except couple of pustules Dr. Algis Liming recommended to continue valtrex to finish 14 days followed by suppressive therapy every day.   4. N/V - zofran 5. 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  6. Severe protein calorie malnutrition: Continue Ensure nutritional supplements  DVT Prophylaxis: on coumadin  Code Status:  Disposition:Hopefully DC tomorrow, awaiting 2-D echo    Subjective:  feeling better, no fevers overnight, still complaining of pain in the right upper quadrant, feeling somewhat overwhelmed   Objective: Weight change:   Intake/Output Summary (Last 24 hours) at 09/21/12 1503 Last data filed at 09/21/12  1300  Gross per 24 hour  Intake    720 ml  Output      0 ml  Net    720 ml   Blood pressure 99/47, pulse 96, temperature 98.9 F (37.2 C), temperature source Oral, resp. rate 16, height 4\' 11"  (1.499 m), weight 37.195 kg (82 lb), SpO2 100.00%.  Physical Exam: General: A x O x3, NAD. CVS: S1-S2 clear, no murmur rubs or gallops Chest: CTAB Abdomen: soft, mildly tender in RUQ, nondistended, normal bowel sounds. Small pustules type rash on the abd   Extremities: no cyanosis, clubbing or edema noted bilaterally   Lab Results: Basic Metabolic Panel:  Recent Labs Lab 09/18/12 0431 09/19/12 1050 09/20/12 0745  NA 136 140 143  K 3.3* 3.3* 3.5  CL 99 103 108  CO2 21 30 25   GLUCOSE 61* 100* 103*  BUN 12 13 9   CREATININE 0.50 0.54 0.47*  CALCIUM 8.5 8.8 8.9  MG 2.1  --   --    Liver Function Tests:  Recent Labs Lab 09/19/12 1050 09/20/12 0745  AST 60* 45*  ALT 227* 168*  ALKPHOS 109 107  BILITOT 0.7 0.4  PROT 6.1 5.8*  ALBUMIN 3.1* 3.0*    Recent Labs Lab 09/16/12 1845  LIPASE 28   No results found for this basename: AMMONIA,  in the last 168 hours CBC:  Recent Labs Lab 09/16/12 1845  09/20/12 0925 09/21/12 0510  WBC 7.4  < > 4.4 5.3  NEUTROABS 5.9  --   --   --   HGB 12.7  < > 11.8*  11.2*  HCT 38.3  < > 34.9* 33.3*  MCV 91.4  < > 89.5 89.5  PLT 195  < > 165 176  < > = values in this interval not displayed. Cardiac Enzymes:  Recent Labs Lab 09/20/12 1600  CKTOTAL 34   BNP: No components found with this basename: POCBNP,  CBG: No results found for this basename: GLUCAP,  in the last 168 hours   Micro Results: No results found for this or any previous visit (from the past 240 hour(s)).  Studies/Results: Ct Abdomen Pelvis Wo Contrast  09/16/2012   *RADIOLOGY REPORT*  Clinical Data: Right flank pain  CT ABDOMEN AND PELVIS WITHOUT CONTRAST  Technique:  Multidetector CT imaging of the abdomen and pelvis was performed following the standard protocol  without intravenous contrast.  Comparison: None.  Findings: No urinary calculus.  No hydronephrosis.  Post cholecystectomy clips.  Pneumobilia in the liver is most likely related to biliary instrumentation.  Spleen, pancreas, adrenal glands are grossly within normal limits.  Prominent stool burden in the ascending and transverse colon.  IMPRESSION: Postoperative changes.  No evidence of urinary obstruction.  Prominent stool burden in the proximal colon.   Original Report Authenticated By: Jolaine Click, M.D.   Dg Chest 2 View  09/19/2012   *RADIOLOGY REPORT*  Clinical Data: Fever.  Breast cancer.  CHEST - 2 VIEW  Comparison: 09/16/2012; 11/20/2011  Findings: There is evidence of bilateral prior mastectomy and bilateral axillary dissections.  Biapical pleuroparenchymal scarring in the lungs.  Lungs appear otherwise clear.  No pleural effusion noted. Cardiac and mediastinal contours appear unremarkable.  IMPRESSION:  1.  No acute findings to explain the patient's fever.   Original Report Authenticated By: Gaylyn Rong, M.D.   Mr 3d Recon At Scanner  09/18/2012   *RADIOLOGY REPORT*  Clinical Data:  Abnormal LFTs  MRI ABDOMEN WITHOUT AND WITH CONTRAST (MRCP)  Technique:  Multiplanar multisequence MR imaging of the abdomen was performed without and with contrast, including heavily T2-weighted images of the biliary and pancreatic ducts.  Three-dimensional MR images were rendered by post processing of the original MR data.  Contrast: 8mL MULTIHANCE GADOBENATE DIMEGLUMINE 529 MG/ML IV SOLN  Comparison:  CT scan from 09/16/2012  Findings:  Study is motion degraded. 6 mm simple cyst identified in the left liver, otherwise focal abnormalities seen in the liver parenchyma.  There is no intrahepatic biliary duct dilatation. Extrahepatic common duct measures 5 mm in diameter.  The common bile duct in the head of the pancreas measures 6 mm in diameter. No filling defect within the extrahepatic biliary system to suggest  common duct stone.  There is no pancreatic ductal dilatation.  The spleen, stomach, duodenum, pancreas, and adrenal glands are unremarkable.  No focal abnormalities seen in either kidney.  10 mm simple cyst is identified in the lower pole of the left kidney.  No abdominal aortic aneurysm.  There is no free fluid or lymphadenopathy visible in the abdomen.  No marrow signal abnormality within the visualized skeleton.  IMPRESSION: Unremarkable study.  No findings to explain the patient's history of abnormal LFTs.   Original Report Authenticated By: Kennith Center, M.D.   Mr Abd W/wo Cm/mrcp  09/18/2012   *RADIOLOGY REPORT*  Clinical Data:  Abnormal LFTs  MRI ABDOMEN WITHOUT AND WITH CONTRAST (MRCP)  Technique:  Multiplanar multisequence MR imaging of the abdomen was performed without and with contrast, including heavily T2-weighted images of the biliary and pancreatic ducts.  Three-dimensional MR images were rendered by  post processing of the original MR data.  Contrast: 8mL MULTIHANCE GADOBENATE DIMEGLUMINE 529 MG/ML IV SOLN  Comparison:  CT scan from 09/16/2012  Findings:  Study is motion degraded. 6 mm simple cyst identified in the left liver, otherwise focal abnormalities seen in the liver parenchyma.  There is no intrahepatic biliary duct dilatation. Extrahepatic common duct measures 5 mm in diameter.  The common bile duct in the head of the pancreas measures 6 mm in diameter. No filling defect within the extrahepatic biliary system to suggest common duct stone.  There is no pancreatic ductal dilatation.  The spleen, stomach, duodenum, pancreas, and adrenal glands are unremarkable.  No focal abnormalities seen in either kidney.  10 mm simple cyst is identified in the lower pole of the left kidney.  No abdominal aortic aneurysm.  There is no free fluid or lymphadenopathy visible in the abdomen.  No marrow signal abnormality within the visualized skeleton.  IMPRESSION: Unremarkable study.  No findings to explain  the patient's history of abnormal LFTs.   Original Report Authenticated By: Kennith Center, M.D.    Medications: Scheduled Meds: . feeding supplement  1 Container Oral TID BM  . LORazepam  1 mg Intravenous Once  . pantoprazole  80 mg Oral Daily  . traZODone  50 mg Oral QHS  . valACYclovir  1,000 mg Oral TID  . warfarin  7.5 mg Oral ONCE-1800  . Warfarin - Pharmacist Dosing Inpatient   Does not apply q1800      LOS: 5 days   Roselyn Doby M.D. [facility] 09/21/2012, 3:03 PM Pager: [phone]  If 7PM-7AM, please contact night-coverage [email or web address] Password [email or web address]

## 2012-09-21 NOTE — Progress Notes (Signed)
[facility]    Subjective: Patient tearful and in more pain this am in her right upper quadrant. She states her pain "is uncontrolled" and she wishes it would get better. Says she felt "a little warm" last night but no chills, sweats, N/V/D, and no headache. She expresses interest in being discharged soon.   Antibiotics:  Anti-infectives   Start     Dose/Rate Route Frequency Ordered Stop   09/17/12 1130  valACYclovir (VALTREX) tablet 1,000 mg     1,000 mg Oral 3 times daily 09/17/12 1127        Medications: Scheduled Meds: . feeding supplement  1 Container Oral TID BM  . LORazepam  1 mg Intravenous Once  . pantoprazole  80 mg Oral Daily  . traZODone  50 mg Oral QHS  . valACYclovir  1,000 mg Oral TID  . Warfarin - Pharmacist Dosing Inpatient   Does not apply q1800   Continuous Infusions:  PRN Meds:.ketorolac, meperidine, ondansetron (ZOFRAN) IV, promethazine, zolpidem   Objective: Weight change:   Intake/Output Summary (Last 24 hours) at 09/21/12 0923 Last data filed at 09/20/12 1700  Gross per 24 hour  Intake    480 ml  Output      0 ml  Net    480 ml   Blood pressure 101/54, pulse 93, temperature 98.8 F (37.1 C), temperature source Oral, resp. rate 16, height 4\' 11"  (1.499 m), weight 37.195 kg (82 lb), SpO2 97.00%. Temp:  [98.2 F (36.8 C)-99.7 F (37.6 C)] 98.8 F (37.1 C) (08/14 0700) Pulse Rate:  [93-103] 93 (08/14 0700) Resp:  [16] 16 (08/14 0700) BP: (98-114)/(44-68) 101/54 mmHg (08/14 0700) SpO2:  [97 %-98 %] 97 % (08/14 0700)  Physical Exam: General: Alert and awake, oriented x3, tearful and complains of RUQ pain HEENT: anicteric sclera, pupils reactive to light and accommodation, EOMI CVS regular rate, normal r,  no murmur rubs or gallops Chest: clear to auscultation bilaterally, no wheezing, rales or rhonchi Abdomen: soft, very tender to percussion and light palpation RUQ, nondistended, normal bowel sounds Extremities:  no  clubbing or edema noted bilaterally Skin: no rashes Lymph: no new lymphadenopathy Neuro: nonfocal  Lab Results:  Recent Labs  09/20/12 0925 09/21/12 0510  WBC 4.4 5.3  HGB 11.8* 11.2*  HCT 34.9* 33.3*  PLT 165 176    BMET  Recent Labs  09/19/12 1050 09/20/12 0745  NA 140 143  K 3.3* 3.5  CL 103 108  CO2 30 25  GLUCOSE 100* 103*  BUN 13 9  CREATININE 0.54 0.47*  CALCIUM 8.8 8.9    Micro Results: No results found for this or any previous visit (from the past 240 hour(s)).  Studies/Results: Dg Chest 2 View  09/19/2012   *RADIOLOGY REPORT*  Clinical Data: Fever.  Breast cancer.  CHEST - 2 VIEW  Comparison: 09/16/2012; 11/20/2011  Findings: There is evidence of bilateral prior mastectomy and bilateral axillary dissections.  Biapical pleuroparenchymal scarring in the lungs.  Lungs appear otherwise clear.  No pleural effusion noted. Cardiac and mediastinal contours appear unremarkable.  IMPRESSION:  1.  No acute findings to explain the patient's fever.   Original Report Authenticated By: Gaylyn Rong, M.D.      Assessment/Plan: Monica Khan is a 67 y.o. female with elevated LFTs on admission with RUQ pain, chronic acyclovir use for shingles and a negative workup for liver disease including MRCP to r/o choledocolithiasis, CT abd/pelvis and MRI to r/o bile duct obstruction. Her  labs are notable for an elevated GGT as well as a >2:1 AST:ALT ratio on admission plus negative serologies for acute hepatitis. Her transaminases have trended down since admission. As zoster has been known to cause episodes of hepatitis such as this, it is highly likely especially given her continuous episodes of zoster especially in the last 2 years.  FUO: --RPR, NR --EBV IgG 138, EBV IgM neg, EBVNAIgG 202, EBVEAIgG neg, CMV IgM neg,  --HIV NR, RF 13, CK 34, Ferritin 158, CRP 8.3 --CMV IgG, blood cx, HepB DNA, HIV rna, ACE level, 2D echo = pending --as she remains afebrile, will refrain from  CT chest   VZV: --continue valtrex 1g TID for 14 day total course (until 09/30/12), then recommend valtrex 1g daily for reduction/prevetion of future episodes of shingles    LOS: 5 days   Lewie Chamber 09/21/2012, 9:23 AM

## 2012-09-21 NOTE — Progress Notes (Signed)
Echocardiogram 2D Echocardiogram has been performed.  Dorothey Baseman 09/21/2012, 1:59 PM

## 2012-09-21 NOTE — Progress Notes (Signed)
INFECTIOUS DISEASE ATTENDING ADDENDUM:     [facility]   Date: 09/21/2012  Patient name: Monica Khan  Medical record number: [id number]  Date of birth: 1945/08/14    This patient has been seen and discussed with the house staff. Please see their note for complete details. I concur with their findings with the following additions/corrections:  Patient seems to be doing quite well. I really DO think her hepatitis may have been part of her VZV flare and would complete course of high dose valtrex to finish 14 days followed by suppressive therapy  I do NOT feel strongly about CT of the chest at this point   Other FUO labs have been sent  I have given her my card and I am happy to see her in my clinic in the next few weeks  Paulette Blanch Dam 09/21/2012, 3:00 PM

## 2012-09-21 NOTE — Progress Notes (Signed)
The echocardiogram department was contacted and assured the RN that the procedure would take place today.

## 2012-09-22 DIAGNOSIS — E43 Unspecified severe protein-calorie malnutrition: Secondary | ICD-10-CM

## 2012-09-22 DIAGNOSIS — R112 Nausea with vomiting, unspecified: Secondary | ICD-10-CM

## 2012-09-22 DIAGNOSIS — Z853 Personal history of malignant neoplasm of breast: Secondary | ICD-10-CM

## 2012-09-22 DIAGNOSIS — B029 Zoster without complications: Secondary | ICD-10-CM

## 2012-09-22 DIAGNOSIS — K769 Liver disease, unspecified: Secondary | ICD-10-CM

## 2012-09-22 DIAGNOSIS — R740 Nonspecific elevation of levels of transaminase and lactic acid dehydrogenase [LDH]: Secondary | ICD-10-CM

## 2012-09-22 LAB — CBC
HCT: 32.8 % — ABNORMAL LOW (ref 36.0–46.0)
MCV: 90.6 fL (ref 78.0–100.0)
RBC: 3.62 MIL/uL — ABNORMAL LOW (ref 3.87–5.11)
RDW: 13.2 % (ref 11.5–15.5)
WBC: 6 10*3/uL (ref 4.0–10.5)

## 2012-09-22 LAB — COMPREHENSIVE METABOLIC PANEL
ALT: 91 U/L — ABNORMAL HIGH (ref 0–35)
AST: 29 U/L (ref 0–37)
Alkaline Phosphatase: 84 U/L (ref 39–117)
CO2: 31 mEq/L (ref 19–32)
Calcium: 8.8 mg/dL (ref 8.4–10.5)
GFR calc non Af Amer: >90 mL/min (ref >90)
Potassium: 3.7 mEq/L (ref 3.5–5.1)
Sodium: 141 mEq/L (ref 135–145)
Total Protein: 5.6 g/dL — ABNORMAL LOW (ref 6.0–8.3)

## 2012-09-22 LAB — PROTIME-INR: INR: 1.5 — ABNORMAL HIGH (ref 0.00–1.49)

## 2012-09-22 MED ORDER — BOOST / RESOURCE BREEZE PO LIQD: 1.0000 | Freq: Three times a day (TID) | ORAL | Status: AC

## 2012-09-22 MED ORDER — VALACYCLOVIR HCL 1 G PO TABS: 1000.0000 mg | ORAL_TABLET | Freq: Three times a day (TID) | ORAL | Status: AC

## 2012-09-22 MED ORDER — VALACYCLOVIR HCL 1 G PO TABS: 1000.0000 mg | ORAL_TABLET | Freq: Three times a day (TID) | ORAL | Status: DC

## 2012-09-22 MED ORDER — BOOST / RESOURCE BREEZE PO LIQD: 1.0000 | Freq: Three times a day (TID) | ORAL | Status: DC

## 2012-09-22 MED ORDER — PROMETHAZINE HCL 12.5 MG PO TABS: 12.5000 mg | ORAL_TABLET | Freq: Four times a day (QID) | ORAL | Status: AC | PRN

## 2012-09-22 MED ORDER — WARFARIN SODIUM 6 MG PO TABS
6.0000 mg | ORAL_TABLET | Freq: Once | ORAL | Status: DC
  Filled 2012-09-22: qty 1

## 2012-09-22 MED ORDER — PROMETHAZINE HCL 12.5 MG PO TABS: 12.5000 mg | ORAL_TABLET | Freq: Four times a day (QID) | ORAL | Status: DC | PRN

## 2012-09-22 NOTE — Progress Notes (Signed)
ANTICOAGULATION CONSULT NOTE - Follow Up Consult  Pharmacy Consult for Coumadin Indication: h/o blood clots  Allergies  Allergen Reactions  . Cephalosporins Anaphylaxis  . Penicillins Anaphylaxis  . Codeine     sickl  . Compazine [Prochlorperazine Edisylate]     Throat swells  . Morphine And Related     Nausea and vomiting    Patient Measurements: Height: 4\' 11"  (149.9 cm) Weight: 82 lb (37.195 kg) IBW/kg (Calculated) : 43.2 Heparin Dosing Weight:   Vital Signs: Temp: 99.2 F (37.3 C) (08/15 0523) Temp src: Oral (08/15 0523) BP: 112/35 mmHg (08/15 0523) Pulse Rate: 79 (08/15 0523)  Labs:  Recent Labs  09/19/12 1050 09/20/12 0745  09/20/12 0925 09/20/12 1600 09/21/12 0510 09/22/12 0500  HGB  --   --   < > 11.8*  --  11.2* 10.8*  HCT  --   --   --  34.9*  --  33.3* 32.8*  PLT  --   --   --  165  --  176 189  LABPROT 17.1*  --   --  13.6  --  14.6 17.7*  INR 1.43  --   --  1.06  --  1.16 1.50*  CREATININE 0.54 0.47*  --   --   --   --  0.49*  CKTOTAL  --   --   --   --  34  --   --   < > = values in this interval not displayed.  Estimated Creatinine Clearance: 40.6 ml/min (by C-G formula based on Cr of 0.49).   Assessment: 67 y.o. female who presents to [facility] ED with c/o abdominal pain, n/v, with LFT elevation (NO known hx of hepatitis), on acyclovir for outbreak of singles.  Anticoagulation: Coumadin PTA (4mg  for 2 days then 6mg  for one day, then repeats the cycle-for hx of clots) INR 1.5   Goal of Therapy:  INR 2-3 Monitor platelets by anticoagulation protocol: Yes   Plan:  Coumadin 6mg  po x 1 tonight  Merilynn Finland, [employer] 09/22/2012,10:44 AM

## 2012-09-22 NOTE — Discharge Summary (Signed)
Physician Discharge Summary  Patient ID: Monica Khan MRN: [id number] DOB/AGE: 67/19/1947 67 y.o.  Admit date: 09/16/2012 Discharge date: 09/22/2012  Primary Care Physician:  Toniann Fail, MD  Discharge Diagnoses:    . Hepatocellular injury . Transaminitis . Nausea and vomiting . Shingles . Protein-calorie malnutrition, severe Fever of unknown origin  Consults:  Gastroenterology, Dr. Evette Cristal                    Infectious disease, Dr. Algis Liming   Recommendations for Outpatient Follow-up:  Please obtain CMET or LFTs for follow-up  Allergies:   Allergies  Allergen Reactions  . Cephalosporins Anaphylaxis  . Penicillins Anaphylaxis  . Codeine     sickl  . Compazine [Prochlorperazine Edisylate]     Throat swells  . Morphine And Related     Nausea and vomiting     Discharge Medications:   Medication List    STOP taking these medications       acetaminophen 325 MG tablet  Commonly known as:  TYLENOL      TAKE these medications       azelastine 137 MCG/SPRAY nasal spray  Commonly known as:  ASTELIN  Place 1 spray into the nose 2 (two) times daily as needed for rhinitis.     feeding supplement Liqd  Take 1 Container by mouth 3 (three) times daily between meals.     meperidine 50 MG tablet  Commonly known as:  DEMEROL  Take 50-100 mg by mouth every 4 (four) hours as needed for pain.     omeprazole 40 MG capsule  Commonly known as:  PRILOSEC  Take 40 mg by mouth daily.     promethazine 12.5 MG tablet  Commonly known as:  PHENERGAN  Take 1 tablet (12.5 mg total) by mouth every 6 (six) hours as needed for nausea.     temazepam 30 MG capsule  Commonly known as:  RESTORIL  Take 30 mg by mouth at bedtime as needed for sleep.     traZODone 50 MG tablet  Commonly known as:  DESYREL  Take 50 mg by mouth at bedtime as needed for sleep or depression.     valACYclovir 1000 MG tablet  Commonly known as:  VALTREX  Take 1 tablet (1,000 mg total) by mouth 3 (three) times  daily. Till 09/24/12 then take daily     warfarin 4 MG tablet  Commonly known as:  COUMADIN  Take 4-6 mg by mouth daily with breakfast. Takes 4mg  for 2 days and takes 6mg  on day 3; then repeats the cycle.         Brief H and P: For complete details please refer to admission H and P, but in brief Monica Khan is a 67 y.o. female who presents to [facility] ED with c/o abdominal pain. Abd pain is moderate in intensity, sharp, located in her RUQ, and onset a few hours ago after eating a normal meal (h/o cholecystectomy). Associated nausea with 2 episodes of vomiting (NBNB). H/o gastritis as well. No previous known history of hepatitis, does get recurrent shingles frequently and has a singles outbreak at this time she states for which she is on acyclovir   Hospital Course:   1. Transaminitis - with a hepatocellular pattern, could be due to tylenol use, but levels negative - Despite the > 2:1 AST:ALT ratio, the patient denies any EtOH use, last drink was a few oz of wine when her husband died 2 years ago, nothing since then, never has  been a heavy drinker. MRCP negative - acute hepatitis panel negative, EBV IgM negative - CT scan of liver didn't show any suspicious lesions. Gastroenterology was consulted and not sure of the etiology, LFT's have been improving. MRI abdomen was unremarkable. Zoster has been known to cause episodes of hepatitis, and in her situation possible given her recurrent episodes of zoster in the last 2 years.  2. Fever: Of unclear etiology? Autoimmune process. Patient had no rashes or lesions. chest x-ray negative for any pneumonia , UA negative for any UTI. Infectious [facility] that was obtained. Patient had extensive workup including HIV negative, RPR, hepatitis panel negative. Rheumatoid factor was within normal range.   2-D echo was obtained which was also negative for any vegetations. 3. Shingles: Patient was on Valtrex prior to admission, no visible rash except couple of  pustules Dr. Algis Liming recommended to continue valtrex to finish 14 days followed by suppressive therapy every day.  4. 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 2-D echo was negative for any vegetation or any thrombus. 5. Severe protein calorie malnutrition: Continue Ensure nutritional supplements     Day of Discharge BP 112/35  Pulse 79  Temp(Src) 99.2 F (37.3 C) (Oral)  Resp 18  Ht 4\' 11"  (1.499 m)  Wt 37.195 kg (82 lb)  BMI 16.55 kg/m2  SpO2 98%  Physical Exam: General: Alert and awake oriented x3 not in any acute distress. CVS: S1-S2 clear no murmur rubs or gallops Chest: clear to auscultation bilaterally, no wheezing rales or rhonchi Abdomen: soft nontender, nondistended, normal bowel sounds Extremities: no cyanosis, clubbing or edema noted bilaterally Neuro: Cranial nerves II-XII intact, no focal neurological deficits   The results of significant diagnostics from this hospitalization (including imaging, microbiology, ancillary and laboratory) are listed below for reference.    LAB RESULTS: Basic Metabolic Panel:  Recent Labs Lab 09/18/12 0431  09/20/12 0745 09/22/12 0500  NA 136  < > 143 141  K 3.3*  < > 3.5 3.7  CL 99  < > 108 103  CO2 21  < > 25 31  GLUCOSE 61*  < > 103* 88  BUN 12  < > 9 10  CREATININE 0.50  < > 0.47* 0.49*  CALCIUM 8.5  < > 8.9 8.8  MG 2.1  --   --   --   < > = values in this interval not displayed. Liver Function Tests:  Recent Labs Lab 09/20/12 0745 09/22/12 0500  AST 45* 29  ALT 168* 91*  ALKPHOS 107 84  BILITOT 0.4 0.3  PROT 5.8* 5.6*  ALBUMIN 3.0* 2.7*    Recent Labs Lab 09/16/12 1845  LIPASE 28   No results found for this basename: AMMONIA,  in the last 168 hours CBC:  Recent Labs Lab 09/16/12 1845  09/21/12 0510 09/22/12 0500  WBC 7.4  < > 5.3 6.0  NEUTROABS 5.9  --   --   --   HGB 12.7  < > 11.2* 10.8*  HCT 38.3  < >  33.3* 32.8*  MCV 91.4  < > 89.5 90.6  PLT 195  < > 176 189  < > = values in this interval not displayed. Cardiac Enzymes:  Recent Labs Lab 09/20/12 1600  CKTOTAL 34   BNP: No components found with this basename: POCBNP,  CBG: No results found for this basename: GLUCAP,  in the last 168 hours  Significant Diagnostic Studies:  Ct Abdomen Pelvis Wo Contrast  09/16/2012   *RADIOLOGY REPORT*  Clinical Data: Right flank pain  CT ABDOMEN AND PELVIS WITHOUT CONTRAST  Technique:  Multidetector CT imaging of the abdomen and pelvis was performed following the standard protocol without intravenous contrast.  Comparison: None.  Findings: No urinary calculus.  No hydronephrosis.  Post cholecystectomy clips.  Pneumobilia in the liver is most likely related to biliary instrumentation.  Spleen, pancreas, adrenal glands are grossly within normal limits.  Prominent stool burden in the ascending and transverse colon.  IMPRESSION: Postoperative changes.  No evidence of urinary obstruction.  Prominent stool burden in the proximal colon.   Original Report Authenticated By: Jolaine Click, M.D.    2D ECHO: Study Conclusions  Left ventricle: The cavity size was normal. Wall thickness was normal. Systolic function was vigorous. The estimated ejection fraction was in the range of 65% to 70%.    Disposition and Follow-up:     Discharge Orders   Future Orders Complete By Expires   Diet - low sodium heart healthy  As directed    Discharge instructions  As directed    Comments:     Please take Valtrex 1g three times/day till 09/24/12. Then continue daily from 09/25/12. If you have any shingles recurrence on valtrex, you will need to make a follow-up appointment with Dr Daiva Eves (Infectious Disease).   Increase activity slowly  As directed        DISPOSITION: Home  DIET heart healthy diet  ACTIVITY: As tolerated   DISCHARGE FOLLOW-UP Follow-up Information   Follow up with Graylin Shiver, MD. Schedule an  appointment as soon as possible for a visit in 2 weeks.   Specialty:  Gastroenterology   Contact information:   [address] [place] [address] [phone]       Follow up with Toniann Fail, MD. Schedule an appointment as soon as possible for a visit in 10 days. (for hospital follow-up)    Specialty:  Family Medicine   Contact information:   [address] [place] [address] [phone]       Schedule an appointment as soon as possible for a visit with Acey Lav, MD. (As needed if you have any recurrence of shingles on valtrex.)    Specialty:  Infectious Diseases   Contact information:   [address] [place] [address] [phone]       Time spent on Discharge: 38 mins  Signed:   RAI,RIPUDEEP M.D. [facility] 09/22/2012, 4:18 PM Pager: [phone]

## 2012-09-22 NOTE — Progress Notes (Signed)
INFECTIOUS DISEASE ATTENDING ADDENDUM:     [facility]   Date: 09/22/2012  Patient name: Monica Khan  Medical record number: [id number]  Date of birth: 1945-09-29    This patient has been seen and discussed with the house staff. Please see their note for complete details. I concur with their findings with the following additions/corrections:   I did not examine this pt today as she was dc prior to my seeing so I did not bill for visit. Acey Lav 09/22/2012, 8:25 PM

## 2012-09-23 LAB — CMV ANTIBODY, IGG (EIA): CMV Ab - IgG: 7 U/mL — ABNORMAL HIGH (ref <0.60)

## 2012-09-25 LAB — CMV (CYTOMEGALOVIRUS) DNA ULTRAQUANT, PCR

## 2012-09-27 LAB — CULTURE, BLOOD (ROUTINE X 2)
Culture: NO GROWTH
Culture: NO GROWTH

## 2012-10-25 ENCOUNTER — Inpatient Hospital Stay: Payer: Medicare Other | Admitting: Infectious Disease

## 2018-02-22 ENCOUNTER — Emergency Department (HOSPITAL_BASED_OUTPATIENT_CLINIC_OR_DEPARTMENT_OTHER): Payer: Medicare Other

## 2018-02-22 ENCOUNTER — Emergency Department (HOSPITAL_BASED_OUTPATIENT_CLINIC_OR_DEPARTMENT_OTHER)
Admission: EM | Admit: 2018-02-22 | Discharge: 2018-02-22 | Disposition: A | Discharge status: Home / Self Care | Payer: Medicare Other | Attending: Emergency Medicine | Admitting: Emergency Medicine

## 2018-02-22 ENCOUNTER — Other Ambulatory Visit: Payer: Self-pay

## 2018-02-22 ENCOUNTER — Encounter (HOSPITAL_BASED_OUTPATIENT_CLINIC_OR_DEPARTMENT_OTHER): Payer: Self-pay | Admitting: Emergency Medicine

## 2018-02-22 DIAGNOSIS — Z853 Personal history of malignant neoplasm of breast: Secondary | ICD-10-CM | POA: Diagnosis not present

## 2018-02-22 DIAGNOSIS — R791 Abnormal coagulation profile: Secondary | ICD-10-CM

## 2018-02-22 DIAGNOSIS — Z79899 Other long term (current) drug therapy: Secondary | ICD-10-CM | POA: Insufficient documentation

## 2018-02-22 DIAGNOSIS — M79601 Pain in right arm: Secondary | ICD-10-CM

## 2018-02-22 DIAGNOSIS — R0602 Shortness of breath: Secondary | ICD-10-CM

## 2018-02-22 HISTORY — DX: Other specified cardiac arrhythmias: I49.8

## 2018-02-22 LAB — BASIC METABOLIC PANEL
Anion gap: 7 (ref 5–15)
BUN: 13 mg/dL (ref 8–23)
CHLORIDE: 105 mmol/L (ref 98–111)
CO2: 24 mmol/L (ref 22–32)
CREATININE: 0.67 mg/dL (ref 0.44–1.00)
Calcium: 9 mg/dL (ref 8.9–10.3)
GFR calc Af Amer: >60 mL/min (ref >60)
Glucose, Bld: 93 mg/dL (ref 70–99)
POTASSIUM: 3.7 mmol/L (ref 3.5–5.1)
SODIUM: 136 mmol/L (ref 135–145)

## 2018-02-22 LAB — CBC WITH DIFFERENTIAL/PLATELET
Abs Immature Granulocytes: 0.04 10*3/uL (ref 0.00–0.07)
Basophils Absolute: 0.1 10*3/uL (ref 0.0–0.1)
Basophils Relative: 1 %
Eosinophils Absolute: 0.3 10*3/uL (ref 0.0–0.5)
Eosinophils Relative: 2 %
HCT: 40.9 % (ref 36.0–46.0)
HEMOGLOBIN: 13 g/dL (ref 12.0–15.0)
Immature Granulocytes: 0 %
Lymphocytes Relative: 22 %
Lymphs Abs: 2.5 10*3/uL (ref 0.7–4.0)
MCH: 28.8 pg (ref 26.0–34.0)
MCHC: 31.8 g/dL (ref 30.0–36.0)
MCV: 90.7 fL (ref 80.0–100.0)
Monocytes Absolute: 1.2 10*3/uL — ABNORMAL HIGH (ref 0.1–1.0)
Monocytes Relative: 11 %
NEUTROS PCT: 64 %
NRBC: 0 % (ref 0.0–0.2)
Neutro Abs: 7.4 10*3/uL (ref 1.7–7.7)
PLATELETS: 290 10*3/uL (ref 150–400)
RBC: 4.51 MIL/uL (ref 3.87–5.11)
RDW: 12.4 % (ref 11.5–15.5)
WBC: 11.5 10*3/uL — AB (ref 4.0–10.5)

## 2018-02-22 LAB — PROTIME-INR
INR: 1.63
PROTHROMBIN TIME: 19.1 s — AB (ref 11.4–15.2)

## 2018-02-22 LAB — TROPONIN I: Troponin I: <0.03 ng/mL (ref <0.03)

## 2018-02-22 MED ORDER — ONDANSETRON HCL 4 MG/2ML IJ SOLN
4.0000 mg | Freq: Once | INTRAMUSCULAR | Status: AC
  Administered 2018-02-22: 4 mg via INTRAVENOUS
  Filled 2018-02-22: qty 2

## 2018-02-22 MED ORDER — ALBUTEROL SULFATE HFA 108 (90 BASE) MCG/ACT IN AERS: 2.0000 | INHALATION_SPRAY | Freq: Once | RESPIRATORY_TRACT | Status: DC

## 2018-02-22 MED ORDER — IOPAMIDOL (ISOVUE-370) INJECTION 76%
100.0000 mL | Freq: Once | INTRAVENOUS | Status: AC | PRN
  Administered 2018-02-22: 100 mL via INTRAVENOUS

## 2018-02-22 MED ORDER — HYDROMORPHONE HCL 1 MG/ML IJ SOLN
0.5000 mg | Freq: Once | INTRAMUSCULAR | Status: AC
  Administered 2018-02-22: 0.5 mg via INTRAVENOUS
  Filled 2018-02-22: qty 1

## 2018-02-22 MED ORDER — PREDNISONE 20 MG PO TABS: 40.0000 mg | ORAL_TABLET | Freq: Once | ORAL | Status: DC

## 2018-02-22 MED ORDER — HYDROMORPHONE HCL 1 MG/ML IJ SOLN
1.0000 mg | Freq: Once | INTRAMUSCULAR | Status: AC
  Administered 2018-02-22: 1 mg via INTRAVENOUS
  Filled 2018-02-22: qty 1

## 2018-02-22 NOTE — ED Notes (Signed)
Pt requested and was provided coke.

## 2018-02-22 NOTE — ED Notes (Signed)
Patient transported to CT 

## 2018-02-22 NOTE — Discharge Instructions (Signed)
You were seen in the ER today for some shortness of breath and right arm pain.  Your work-up in the ER was overall reassuring.  Your imaging did not show any acute findings or blood clots.  Your labs were all fairly normal.  Your INR was low today at 1.63, be sure to call the provider that manages your Coumadin to make them aware of this so it can be adjusted.  Your white blood cell count was mildly high at 11.5.  Please see primary care within the next 1 to 3 days for reevaluation of your symptoms.  Return to the ER for new or worsening symptoms or any other concerns.   Labs:  Troponin I - ONCE - STAT  01/15 1448 Troponin I <3.73   Basic metabolic panel  42/87 6811 Sodium 136  Potassium 3.7  Chloride 105  CO2 24  Glucose 93  BUN 13  Creatinine 0.67  Calcium 9.0  GFR, Est Non African American >60  GFR, Est African American >60  Anion gap 7   Protime-INR  01/15 1004 Prothrombin Time 19.1  INR 1.63       CBC with Differential  01/15 1004 WBC 11.5  RBC 4.51  Hemoglobin 13.0  HCT 40.9  MCV 90.7  MCH 28.8  MCHC 31.8  RDW 12.4  Platelets 290  nRBC 0.0  Neutrophils 64  NEUT# 7.4  Lymphocytes 22  Lymphocyte # 2.5  Monocytes Relative 11  Monocyte # 1.2  Eosinophil 2  Eosinophils Absolute 0.3  Basophil 1  Basophils Absolute 0.1  Immature Granulocytes 0  Abs Immature Granulocytes 0.04

## 2018-02-22 NOTE — ED Notes (Signed)
Patient transported to X-ray 

## 2018-02-22 NOTE — ED Triage Notes (Signed)
Right arm and axilla pain with swelling since last night. Hx of blood clot.

## 2018-02-22 NOTE — ED Notes (Signed)
Patient ambulated around department. SAT 97-99% throughout trip. No SOB noted

## 2018-02-22 NOTE — ED Notes (Signed)
Pt requesting something drink

## 2018-02-22 NOTE — ED Provider Notes (Signed)
[facility]PART[facility] Provider Note   CSN: [id number] Arrival date & time: 02/22/18  0935     History   Chief Complaint Chief Complaint  Patient presents with  . Arm Pain    HPI Monica Khan is a 73 y.o. female with a hx of breast cancer in remission s/p chemotherapy and bilateral mastectomy w/ L sided lymph node resection & residual lymphedema to the LUE/truncal area, prior DVT anticoagulated on warfarin who presents to the emergency department with a chief complaint of right upper extremity pain/swelling which started last night.  States that last night she went out to the mailbox to retrieve her mail and she became very short of breath with exertion.  She states she had a pulse oximetry monitor at home which he utilized and noted that her SPO2 was in the 70s, it did trend up into the 80s and eventually up to 92% where she did not seek care at that time.  She has had some issues in the past with shortness of breath with exertion, has had hospital admission for similar without obvious cause or concerns. She states that she was not specifically experiencing any chest pain at the time.  She states that throughout the night she started to have pain and some swelling as well as some erythema to the right upper extremity.  Discomfort is to the entire hand/wrist and seems to run up the medial aspect of the arm along a vein pattern and into the R chest wall.  Feels like it is burning. Worse with movement. No alleviating factors. States the hand feels somewhat tingly.  Currently also feeling a bit nauseous.  Denies fever, chills, vomiting, diaphoresis, lightheadedness, syncope, hemoptysis, recent long travel, recent hospitalizations, or recent surgery.  She has been compliant with her Coumadin.  She also notes that she is currently having some palpitations, she states she has had these intermittently since the death of her husband.  HPI  Past Medical History:  Diagnosis Date  .  Breast cancer ([place])    In remission  . Fluttering heart   . Gastritis   . Migraines   . Peripheral neuropathy   . Shingles    Recurrent    Patient Active Problem List   Diagnosis Date Noted  . Protein-calorie malnutrition, severe ([place]) 09/20/2012  . Hepatocellular injury 09/17/2012  . Transaminitis 09/17/2012  . Nausea and vomiting 09/17/2012  . Shingles 09/17/2012  . History of breast cancer 09/17/2012    Past Surgical History:  Procedure Laterality Date  . ABDOMINAL HYSTERECTOMY    . APPENDECTOMY    . CHOLECYSTECTOMY    . TOTAL MASTECTOMY       OB History   No obstetric history on file.      Home Medications    Prior to Admission medications   Medication Sig Start Date End Date Taking? Authorizing Provider  azelastine (ASTELIN) 137 MCG/SPRAY nasal spray Place 1 spray into the nose 2 (two) times daily as needed for rhinitis.    [provider]  feeding supplement (RESOURCE BREEZE) LIQD Take 1 Container by mouth 3 (three) times daily between meals. 09/22/12   Rai, Ripudeep Raliegh Ip, MD  meperidine (DEMEROL) 50 MG tablet Take 50-100 mg by mouth every 4 (four) hours as needed for pain.    [provider]  omeprazole (PRILOSEC) 40 MG capsule Take 40 mg by mouth daily.    [provider]  promethazine (PHENERGAN) 12.5 MG tablet Take 1 tablet (12.5 mg total) by  mouth every 6 (six) hours as needed for nausea. 09/22/12   Rai, Ripudeep K, MD  temazepam (RESTORIL) 30 MG capsule Take 30 mg by mouth at bedtime as needed for sleep.    [provider]  traZODone (DESYREL) 50 MG tablet Take 50 mg by mouth at bedtime as needed for sleep or depression.    [provider]  valACYclovir (VALTREX) 1000 MG tablet Take 1 tablet (1,000 mg total) by mouth 3 (three) times daily. Till 09/24/12 then take daily 09/22/12   Rai, Vernelle Emerald, MD  warfarin (COUMADIN) 4 MG tablet Take 4-6 mg by mouth daily with breakfast. Takes 4mg  for 2 days and takes 6mg  on day 3;  then repeats the cycle.    [provider]    Family History No family history on file.  Social History Social History   Tobacco Use  . Smoking status: Never Smoker  . Smokeless tobacco: Never Used  Substance Use Topics  . Alcohol use: No    Comment: States she has never been a heavy drinker and last EtOH was 2 years ago when husband died.  . Drug use: No     Allergies   Cephalosporins; Penicillins; Codeine; Compazine [prochlorperazine edisylate]; and Morphine and related   Review of Systems Review of Systems  Constitutional: Negative for chills and fever.  Respiratory: Positive for shortness of breath.   Cardiovascular: Positive for palpitations. Negative for chest pain.  Gastrointestinal: Positive for nausea. Negative for abdominal pain and vomiting.  Musculoskeletal:       Positive for right upper extremity pain and swelling.  Skin: Positive for color change (RUE).  Neurological: Negative for weakness.       Positive for paresthesias to the right hand.  All other systems reviewed and are negative.    Physical Exam Updated Vital Signs BP 123/73   Pulse 79   Temp 98.9 F (37.2 C) (Oral)   Resp 19   Ht 4\' 11"  (1.499 m)   Wt 48.5 kg   SpO2 98%   BMI 21.61 kg/m   Physical Exam Vitals signs and nursing note reviewed.  Constitutional:      General: She is not in acute distress.    Appearance: She is well-developed. She is not toxic-appearing.  HENT:     Head: Normocephalic and atraumatic.  Eyes:     General:        Right eye: No discharge.        Left eye: No discharge.     Conjunctiva/sclera: Conjunctivae normal.  Neck:     Musculoskeletal: Neck supple.  Cardiovascular:     Rate and Rhythm: Normal rate and regular rhythm.     Heart sounds: No murmur.     Comments: 2+ symmetric radial pulses. Pulmonary:     Effort: Pulmonary effort is normal. No respiratory distress.     Breath sounds: Normal breath sounds. No wheezing, rhonchi or rales.    Abdominal:     General: There is no distension.     Palpations: Abdomen is soft.     Tenderness: There is no abdominal tenderness.  Musculoskeletal:     Comments: Upper extremities: R hand thenar eminence is mildly erythematous extending to the wrist area.  Patient has full intact range of motion to bilateral shoulders, elbows, as well as the left wrist and all digits.  She has some issues with right wrist flexion extension secondary to pain but is able to move in each of these directions.  She does  have some discomfort with trying to make a fist on the right side and is unable to fully do this.  She is tender to palpation to the entire hand and the wrist, this extends to the lower and upper arm along the basilic vein pattern.  No obvious appreciable edema.  Hand does appear a bit swollen.  Compartments are fairly soft.  Skin:    General: Skin is warm and dry.     Findings: No rash.  Neurological:     Mental Status: She is alert.     Comments: Clear speech.  Sensation grossly intact to bilateral upper extremities.  Difficulty assessing grip strength in the right hand secondary to pain.  Psychiatric:        Behavior: Behavior normal.      ED Treatments / Results  Labs (all labs ordered are listed, but only abnormal results are displayed) Labs Reviewed  PROTIME-INR - Abnormal; Notable for the following components:      Result Value   Prothrombin Time 19.1 (*)    All other components within normal limits  CBC WITH DIFFERENTIAL/PLATELET - Abnormal; Notable for the following components:   WBC 11.5 (*)    Monocytes Absolute 1.2 (*)    All other components within normal limits  BASIC METABOLIC PANEL  TROPONIN I  TROPONIN I    EKG EKG Interpretation  Date/Time:  Wednesday February 22 2018 11:47:22 EST Ventricular Rate:  82 PR Interval:    QRS Duration: 117 QT Interval:  416 QTC Calculation: 486 R Axis:   -62 Text Interpretation:  Sinus rhythm Consider right atrial enlargement  Incomplete RBBB and LAFB No prior ECG for comparison.  No STEMI Confirmed by Antony Blackbird [phone]) on 02/22/2018 1:25:22 PM   Radiology Dg Wrist Complete Right  Result Date: 02/22/2018 CLINICAL DATA:  Acute onset of diffuse RIGHT wrist pain that began last night. No known injuries. EXAM: RIGHT WRIST - COMPLETE 3+ VIEW COMPARISON:  None. FINDINGS: No evidence of acute, subacute or healed fractures. Severe narrowing of the scaphotrapezium and the trapezium-first metacarpal joint spaces. Associated spurring and possible calcified loose bodies in the trapezium-first metacarpal joint. Remaining joint spaces well-preserved. Mild osseous demineralization. IMPRESSION: No acute or subacute osseous abnormality. Severe osteoarthritis involving the scaphotrapezium and trapezium-first metacarpal joints. Electronically Signed   By: Evangeline Dakin M.D.   On: 02/22/2018 15:33   Ct Angio Chest Pe W/cm &/or Wo Cm  Result Date: 02/22/2018 CLINICAL DATA:  Shortness of breath and pain EXAM: CT ANGIOGRAPHY CHEST WITH CONTRAST TECHNIQUE: Multidetector CT imaging of the chest was performed using the standard protocol during bolus administration of intravenous contrast. Multiplanar CT image reconstructions and MIPs were obtained to evaluate the vascular anatomy. CONTRAST:  136mL ISOVUE-370 IOPAMIDOL (ISOVUE-370) INJECTION 76% COMPARISON:  Chest CT October 07, 2016 FINDINGS: Cardiovascular: There is no demonstrable pulmonary embolus. There is no thoracic aortic aneurysm or dissection. The visualized great vessels appear unremarkable. There is no pericardial effusion or pericardial thickening. There is modest coronary artery calcification. There is mild aortic atherosclerotic calcification in the aorta. Mediastinum/Nodes: Thyroid appears unremarkable. There is no demonstrable thoracic adenopathy. There is a small hiatal hernia. Lungs/Pleura: There is slight bibasilar atelectatic change. There is no appreciable edema or  consolidation. No appreciable pleural effusion or pleural thickening. Upper Abdomen: Visualized upper abdominal structures appear unremarkable. Musculoskeletal: There are no blastic or lytic bone lesions. No evident chest wall lesions. Review of the MIP images confirms the above findings. IMPRESSION: 1. No demonstrable pulmonary embolus.  No thoracic aortic aneurysm or dissection. Modest coronary artery calcification noted. Mild thoracic aortic atherosclerosis. 2.  Mild bibasilar atelectasis.  No edema or consolidation evident. 3.  No appreciable thoracic adenopathy. 4.  Small hiatal hernia. Aortic Atherosclerosis (ICD10-I70.0). Electronically Signed   By: Lowella Grip III M.D.   On: 02/22/2018 11:31   US Venous Img Upper Right (dvt Study)  Result Date: 02/22/2018 CLINICAL DATA:  Right upper extremity pain and swelling EXAM: RIGHT UPPER EXTREMITY VENOUS DOPPLER ULTRASOUND TECHNIQUE: Gray-scale sonography with graded compression, as well as color Doppler and duplex ultrasound were performed to evaluate the upper extremity deep venous system from the level of the subclavian vein and including the jugular, axillary, basilic, radial, ulnar and upper cephalic vein. Spectral Doppler was utilized to evaluate flow at rest and with distal augmentation maneuvers. COMPARISON:  None. FINDINGS: Contralateral Subclavian Vein: Respiratory phasicity is normal and symmetric with the symptomatic side. No evidence of thrombus. Normal compressibility. Internal Jugular Vein: No evidence of thrombus. Normal compressibility, respiratory phasicity and response to augmentation. Subclavian Vein: No evidence of thrombus. Normal compressibility, respiratory phasicity and response to augmentation. Axillary Vein: No evidence of thrombus. Normal compressibility, respiratory phasicity and response to augmentation. Cephalic Vein: No evidence of thrombus. Normal compressibility, respiratory phasicity and response to augmentation. Basilic  Vein: No evidence of thrombus. Normal compressibility, respiratory phasicity and response to augmentation. Brachial Veins: No evidence of thrombus. Normal compressibility, respiratory phasicity and response to augmentation. Radial Veins: No evidence of thrombus. Normal compressibility, respiratory phasicity and response to augmentation. Ulnar Veins: No evidence of thrombus. Normal compressibility, respiratory phasicity and response to augmentation. Venous Reflux:  None visualized. Other Findings:  None visualized. IMPRESSION: No evidence of DVT within the right upper extremity. Electronically Signed   By: Rolm Baptise M.D.   On: 02/22/2018 12:58    Procedures Procedures (including critical care time)  SPLINT APPLICATION Date: 34/19/37 Authorized by: Kennith Maes Consent: Verbal consent obtained. Risks and benefits: risks, benefits and alternatives were discussed Consent given by: patient Splint applied by: EDT Location details: R wrist Splint type: Velcro wrist brace Post-procedure: The splinted body part was neurovascularly unchanged following the procedure. Patient tolerance: Patient tolerated the procedure well with no immediate complications.    Medications Ordered in ED Medications  HYDROmorphone (DILAUDID) injection 0.5 mg (0.5 mg Intravenous Given 02/22/18 1047)  ondansetron (ZOFRAN) injection 4 mg (4 mg Intravenous Given 02/22/18 1046)  iopamidol (ISOVUE-370) 76 % injection 100 mL (100 mLs Intravenous Contrast Given 02/22/18 1112)  HYDROmorphone (DILAUDID) injection 1 mg (1 mg Intravenous Given 02/22/18 1153)     Initial Impression / Assessment and Plan / ED Course  I have reviewed the triage vital signs and the nursing notes.  Pertinent labs & imaging results that were available during my care of the patient were reviewed by me and considered in my medical decision making (see chart for details).   Patient presents to the emergency department with chief complaint of  right upper extremity pain/swelling that started throughout the night last night as well as some shortness of breath with exertion which is resolved at present.  Patient is nontoxic-appearing, no apparent distress, initial vitals noted for somewhat elevated blood pressure.  Heart with regular rate and rhythm, lungs are clear.  Her right upper extremity does not appear appreciably edematous, there may be some mild swelling at the wrist with some mild erythema to the wrist and the thenar eminence.  She is neurovascularly intact.  Will initiate care with basic  labs, PT/INR, troponin, as well as CT angios chest for PE and right upper extremity venous Doppler.    Patient's work-up in the emergency department has been reviewed: Nonspecific leukocytosis at 11.5.  No anemia.  No significant electrolyte disturbance.  Renal function is preserved.  Her PT/INR is subtherapeutic.  CT angios of her chest is negative for acute abnormality, no pulmonary embolism, infectious process, or dissection noted. CTA notes modest coronary artery calcification, patient without chest pain, EKG without significant change of STEMI, delta trop negative, doubt ACS at this time.  Her venous duplex study is negative for DVT. Good pulses, less suspicion for arterial embolism. Compartments are soft, doubt compartment syndrome. On reassessment her discomfort has more so localized to the right wrist. ROM somewhat painful, but intact, PROM less uncomfortable, she is not febrile, it is not overly hot, and the erythema has improved, doubt cellulitis/septic joint. She is requesting an x-ray.  X-ray of the wrist negative for fracture dislocation or other acute abnormalities.  There are changes consistent with severe osteoarthritis. Wrist brace applied for comfort  Overall unclear definitive etiology to patient's sxs. She has remained well appearing with fairly normal vitals throughout her visit. She is ambulatory with SpO2 97-99%. Discussed with  supervising physician Dr. Sherry Ruffing who has personally evaluated patient- in agreement with plan for discharge home with close PCP follow up, no clear indication for admission. I discussed results, treatment plan, need for follow-up w/ PCP and provider that manages her coumadin regarding subtherapeutic INR, and return precautions with the patient & her son in law at bedside. Provided opportunity for questions, patient & son in law confirmed understanding and are in agreement with plan.   Vitals:   02/22/18 1430 02/22/18 1539  BP: 123/73 110/61  Pulse: 79 80  Resp: 19 17  Temp:    SpO2: 98% 97%    Final Clinical Impressions(s) / ED Diagnoses   Final diagnoses:  Right arm pain  Shortness of breath  Subtherapeutic international normalized ratio (INR)    ED Discharge Orders    None       Amaryllis Dyke, PA-C 02/22/18 1950    Tegeler, Gwenyth Allegra, MD 02/23/18 [phone]

## 2018-03-26 ENCOUNTER — Emergency Department (HOSPITAL_BASED_OUTPATIENT_CLINIC_OR_DEPARTMENT_OTHER)
Admission: EM | Admit: 2018-03-26 | Discharge: 2018-03-26 | Disposition: A | Discharge status: Home / Self Care | Payer: Medicare Other | Attending: Emergency Medicine | Admitting: Emergency Medicine

## 2018-03-26 ENCOUNTER — Encounter (HOSPITAL_BASED_OUTPATIENT_CLINIC_OR_DEPARTMENT_OTHER): Payer: Self-pay | Admitting: Emergency Medicine

## 2018-03-26 ENCOUNTER — Other Ambulatory Visit: Payer: Self-pay

## 2018-03-26 DIAGNOSIS — Z853 Personal history of malignant neoplasm of breast: Secondary | ICD-10-CM | POA: Insufficient documentation

## 2018-03-26 DIAGNOSIS — Z7901 Long term (current) use of anticoagulants: Secondary | ICD-10-CM | POA: Diagnosis not present

## 2018-03-26 DIAGNOSIS — H5712 Ocular pain, left eye: Secondary | ICD-10-CM | POA: Diagnosis not present

## 2018-03-26 DIAGNOSIS — Z79899 Other long term (current) drug therapy: Secondary | ICD-10-CM | POA: Insufficient documentation

## 2018-03-26 DIAGNOSIS — R51 Headache: Secondary | ICD-10-CM | POA: Diagnosis present

## 2018-03-26 MED ORDER — ONDANSETRON 4 MG PO TBDP
4.0000 mg | ORAL_TABLET | Freq: Once | ORAL | Status: AC
  Administered 2018-03-26: 4 mg via ORAL
  Filled 2018-03-26: qty 1

## 2018-03-26 MED ORDER — FLUORESCEIN SODIUM 1 MG OP STRP
1.0000 | ORAL_STRIP | Freq: Once | OPHTHALMIC | Status: AC
  Administered 2018-03-26: 1 via OPHTHALMIC
  Filled 2018-03-26: qty 1

## 2018-03-26 MED ORDER — TETRACAINE HCL 0.5 % OP SOLN
2.0000 [drp] | Freq: Once | OPHTHALMIC | Status: AC
  Administered 2018-03-26: 2 [drp] via OPHTHALMIC
  Filled 2018-03-26: qty 4

## 2018-03-26 MED ORDER — HYDROMORPHONE HCL 1 MG/ML IJ SOLN
1.0000 mg | Freq: Once | INTRAMUSCULAR | Status: AC
  Administered 2018-03-26: 1 mg via INTRAMUSCULAR
  Filled 2018-03-26: qty 1

## 2018-03-26 MED ORDER — ONDANSETRON 4 MG PO TBDP: 4.0000 mg | ORAL_TABLET | Freq: Three times a day (TID) | ORAL | 0 refills | Status: DC | PRN

## 2018-03-26 NOTE — ED Notes (Signed)
Pt has driver

## 2018-03-26 NOTE — ED Triage Notes (Signed)
L eye pain. She is being treated for shingles to L side of her face. Sent from [facility] for further eval.

## 2018-03-26 NOTE — Discharge Instructions (Signed)
Please read and follow all provided instructions.  Your diagnoses today include:  1. Eye pain, left     Tests performed today include:  Visual acuity testing to check your vision  Fluorescein dye examination to look for scratches on your eye  Tonometry to check the pressure inside of your eye  Vital signs. See below for your results today.   Medications prescribed:   Zofran (ondansetron) - for nausea and vomiting  Take any prescribed medications only as directed.  Home care instructions:  Follow any educational materials contained in this packet. If you wear contact lenses, do not use them until your eye caregiver approves.   Follow-up instructions: Please call your ophthalmologist tomorrow first thing in the morning and schedule an appointment to be seen.  Tell them you you are seen in the emergency department and there is concern that you have shingles involving your eye.  Return instructions:   Please return to the Emergency Department if you experience worsening symptoms.   Please return immediately if you develop severe pain, pus drainage, new change in vision, or fever.  Please return if you have any other emergent concerns.  Additional Information:  Your vital signs today were: BP (!) 163/83 (BP Location: Left Arm)    Pulse (!) 101    Temp 97.9 F (36.6 C) (Oral)    Resp 16    Ht 4\' 11"  (1.499 m)    Wt 51.7 kg    SpO2 97%    BMI 23.03 kg/m  If your blood pressure (BP) was elevated above 135/85 this visit, please have this repeated by your doctor within one month. ---------------

## 2018-03-26 NOTE — ED Provider Notes (Signed)
[place] EMERGENCY DEPARTMENT Provider Note   CSN: [id number] Arrival date & time: 03/26/18  1107     History   Chief Complaint Chief Complaint  Patient presents with  . Eye Problem    HPI Monica Khan is a 73 y.o. female.  Patient with history of breast cancer status post chemotherapy, frequent and recurrent episodes of shingles --presents with complaint of left-sided facial pain and rash to her cheek and temporal area starting the past several days.  Patient was seen at an urgent care yesterday and was started on Valtrex.  She has taken a couple of doses of this.  She went back for follow-up today and had worsening pain and blurry vision in her left eye and was referred to the emergency department.  She sees Dr. Amalia Hailey, ophthalmologist, and [facility].  She has had chronic dry eyes and had tear duct plugs recently removed.  She uses eyedrops frequently to keep her eyes moisturized.  She does wear glasses.  She has had some chills but no fever.  She had one episode of vomiting this morning.  Eye is not red.  No swelling around the eye.  She has not had pain like this with previous shingles flares.  Onset of symptoms acute.  Course is constant.     Past Medical History:  Diagnosis Date  . Breast cancer ([place])    In remission  . Fluttering heart   . Gastritis   . Migraines   . Peripheral neuropathy   . Shingles    Recurrent    Patient Active Problem List   Diagnosis Date Noted  . Protein-calorie malnutrition, severe ([place]) 09/20/2012  . Hepatocellular injury 09/17/2012  . Transaminitis 09/17/2012  . Nausea and vomiting 09/17/2012  . Shingles 09/17/2012  . History of breast cancer 09/17/2012    Past Surgical History:  Procedure Laterality Date  . ABDOMINAL HYSTERECTOMY    . APPENDECTOMY    . CHOLECYSTECTOMY    . TOTAL MASTECTOMY       OB History   No obstetric history on file.      Home Medications    Prior to Admission medications   Medication  Sig Start Date End Date Taking? Authorizing Provider  azelastine (ASTELIN) 137 MCG/SPRAY nasal spray Place 1 spray into the nose 2 (two) times daily as needed for rhinitis.    [provider]  feeding supplement (RESOURCE BREEZE) LIQD Take 1 Container by mouth 3 (three) times daily between meals. 09/22/12   Rai, Ripudeep Raliegh Ip, MD  meperidine (DEMEROL) 50 MG tablet Take 50-100 mg by mouth every 4 (four) hours as needed for pain.    [provider]  omeprazole (PRILOSEC) 40 MG capsule Take 40 mg by mouth daily.    [provider]  promethazine (PHENERGAN) 12.5 MG tablet Take 1 tablet (12.5 mg total) by mouth every 6 (six) hours as needed for nausea. 09/22/12   Rai, Ripudeep K, MD  temazepam (RESTORIL) 30 MG capsule Take 30 mg by mouth at bedtime as needed for sleep.    [provider]  traZODone (DESYREL) 50 MG tablet Take 50 mg by mouth at bedtime as needed for sleep or depression.    [provider]  valACYclovir (VALTREX) 1000 MG tablet Take 1 tablet (1,000 mg total) by mouth 3 (three) times daily. Till 09/24/12 then take daily 09/22/12   Rai, Vernelle Emerald, MD  warfarin (COUMADIN) 4 MG tablet Take 4-6 mg by mouth daily with breakfast. Takes 4mg  for  2 days and takes 6mg  on day 3; then repeats the cycle.    [provider]    Family History No family history on file.  Social History Social History   Tobacco Use  . Smoking status: Never Smoker  . Smokeless tobacco: Never Used  Substance Use Topics  . Alcohol use: No    Comment: States she has never been a heavy drinker and last EtOH was 2 years ago when husband died.  . Drug use: No     Allergies   Cephalosporins; Penicillins; Codeine; Compazine [prochlorperazine edisylate]; and Morphine and related   Review of Systems Review of Systems  Constitutional: Negative for fever.  HENT: Negative for ear discharge, ear pain, rhinorrhea and sore throat.   Eyes: Positive for photophobia, pain,  itching and visual disturbance (Blurriness). Negative for discharge and redness.  Respiratory: Negative for cough.   Cardiovascular: Negative for chest pain.  Gastrointestinal: Negative for abdominal pain, diarrhea, nausea and vomiting.  Genitourinary: Negative for dysuria.  Musculoskeletal: Negative for myalgias.  Skin: Negative for rash.  Neurological: Negative for headaches.     Physical Exam Updated Vital Signs BP (!) 163/83 (BP Location: Left Arm)   Pulse (!) 101   Temp 97.9 F (36.6 C) (Oral)   Resp 16   Ht 4\' 11"  (1.499 m)   Wt 51.7 kg   SpO2 97%   BMI 23.03 kg/m   Physical Exam Vitals signs and nursing note reviewed.  Constitutional:      Appearance: She is well-developed.  HENT:     Head: Normocephalic and atraumatic.     Right Ear: Tympanic membrane, ear canal and external ear normal.     Left Ear: Tympanic membrane, ear canal and external ear normal.  Eyes:     General: Lids are normal. No visual field deficit.       Right eye: No foreign body or discharge.        Left eye: No foreign body or discharge.     Intraocular pressure: Left eye pressure is 20 mmHg. Measurements were taken using a handheld tonometer.    Conjunctiva/sclera: Conjunctivae normal.     Right eye: Right conjunctiva is not injected. No chemosis or exudate.    Left eye: Left conjunctiva is not injected. No chemosis or exudate.    Pupils:     Right eye: Pupil is round, reactive and not sluggish.     Left eye: Pupil is round, reactive and not sluggish. Fluorescein uptake present. No corneal abrasion.     Comments: 2 small areas of fluorescein uptake on the cornea between 5 and 7 o'clock position  Neck:     Musculoskeletal: Normal range of motion and neck supple.  Pulmonary:     Effort: No respiratory distress.  Skin:    General: Skin is warm and dry.     Findings: Rash present.     Comments: Mild erythema left cheek.  Nonspecific appearing at this point however with patient's previous  history of shingles and since this feels like shingles to her, suspect that it is herpes zoster.  Neurological:     Mental Status: She is alert.      ED Treatments / Results  Labs (all labs ordered are listed, but only abnormal results are displayed) Labs Reviewed - No data to display  EKG None  Radiology No results found.  Procedures Procedures (including critical care time)  Medications Ordered in ED Medications  fluorescein ophthalmic strip 1 strip (1 strip Left  Eye Given by Other 03/26/18 1121)  tetracaine (PONTOCAINE) 0.5 % ophthalmic solution 2 drop (2 drops Left Eye Given by Other 03/26/18 1120)     Initial Impression / Assessment and Plan / ED Course  I have reviewed the triage vital signs and the nursing notes.  Pertinent labs & imaging results that were available during my care of the patient were reviewed by me and considered in my medical decision making (see chart for details).     Patient seen and examined. Work-up initiated. Medications ordered.   Vital signs reviewed and are as follows: BP (!) 163/83 (BP Location: Left Arm)   Pulse (!) 101   Temp 97.9 F (36.6 C) (Oral)   Resp 16   Ht 4\' 11"  (1.499 m)   Wt 51.7 kg   SpO2 97%   BMI 23.03 kg/m   Two drops of tetracaine instilled into affected eye. This did not change the patient's pain. Normal pressure.   Patient tolerated procedure well without immediate complication.     Visual Acuity  Right Eye Distance: 20/70 Left Eye Distance: 20/50 Bilateral Distance: 20/50  Right Eye Near:   Left Eye Near:    Bilateral Near:     12:06 PM patient discussed with and seen by Dr. Maryan Rued.  Patient will receive IM Dilaudid and ODT Zofran for pain here.  Patient reportedly has pain medication at home which she can continue to use.  Will give prescription for Zofran for home.  She is to call her ophthalmologist first thing in the morning to be seen.  Encouraged return to the emergency department with  worsening symptoms, loss of vision, persistent vomiting, new symptoms or other concerns.  Final Clinical Impressions(s) / ED Diagnoses   Final diagnoses:  Eye pain, left   Patient with left eye pain, possibly ophthalmologic involvement of herpes zoster.  Patient with normal pressure and no signs of glaucoma today.  There is a small area of fluorescein uptake in the lower cornea.  Visual acuity slightly diminished per patient.  No ophthalmologic emergencies at this point however patient will need to follow-up closely with her doctor, r. Evans, tomorrow.  She will continue Valtrex.  ED Discharge Orders         Ordered    ondansetron (ZOFRAN ODT) 4 MG disintegrating tablet  Every 8 hours PRN     03/26/18 1208           Carlisle Cater, PA-C 03/26/18 1210    Blanchie Dessert, MD 03/26/18 1559

## 2018-08-20 ENCOUNTER — Encounter (HOSPITAL_BASED_OUTPATIENT_CLINIC_OR_DEPARTMENT_OTHER): Payer: Self-pay | Admitting: *Deleted

## 2018-08-20 ENCOUNTER — Emergency Department (HOSPITAL_BASED_OUTPATIENT_CLINIC_OR_DEPARTMENT_OTHER)
Admission: EM | Admit: 2018-08-20 | Discharge: 2018-08-21 | Disposition: A | Discharge status: Home / Self Care | Payer: Medicare Other | Attending: Emergency Medicine | Admitting: Emergency Medicine

## 2018-08-20 ENCOUNTER — Other Ambulatory Visit: Payer: Self-pay

## 2018-08-20 DIAGNOSIS — M79622 Pain in left upper arm: Secondary | ICD-10-CM | POA: Diagnosis not present

## 2018-08-20 DIAGNOSIS — M79602 Pain in left arm: Secondary | ICD-10-CM

## 2018-08-20 DIAGNOSIS — I82B12 Acute embolism and thrombosis of left subclavian vein: Secondary | ICD-10-CM | POA: Insufficient documentation

## 2018-08-20 DIAGNOSIS — Z7901 Long term (current) use of anticoagulants: Secondary | ICD-10-CM | POA: Insufficient documentation

## 2018-08-20 DIAGNOSIS — Z853 Personal history of malignant neoplasm of breast: Secondary | ICD-10-CM | POA: Diagnosis not present

## 2018-08-20 MED ORDER — OXYCODONE-ACETAMINOPHEN 5-325 MG PO TABS
1.0000 | ORAL_TABLET | Freq: Once | ORAL | Status: AC
  Administered 2018-08-20: 1 via ORAL
  Filled 2018-08-20: qty 1

## 2018-08-20 MED ORDER — ONDANSETRON 4 MG PO TBDP
4.0000 mg | ORAL_TABLET | Freq: Once | ORAL | Status: AC
  Administered 2018-08-20: 4 mg via ORAL
  Filled 2018-08-20: qty 1

## 2018-08-20 NOTE — ED Triage Notes (Addendum)
Pt reports left under arm pain.  Hx of chronic shingles due to chemo-states that it feels the same. Vomited x 2 this afternoon after taking Acyclovir.

## 2018-08-20 NOTE — ED Provider Notes (Signed)
[place] EMERGENCY DEPARTMENT Provider Note   CSN: [id number] Arrival date & time: 08/20/18  2008    History   Chief Complaint Chief Complaint  Patient presents with  . Arm Pain    HPI Monica Khan is a 73 y.o. female.     HPI  73 year old female with a history of breast cancer status post bilateral mastectomy, peripheral neuropathy, recurrent shingles who presents with left arm pain.  Patient reports that she had onset of left arm pain 2 weeks ago.  At that time she was evaluated by her primary physician.  She has a history of a blood clot in the left subclavian after having a PIC line.  She reports that this was investigated and her clot had not returned.  She had some improvement of pain.  However, it recurred again this past week.  She saw her doctor on Thursday and was told she may have early shingles again.  She has been using ice at home with minimal relief.  Currently she rates her pain 8 out of 10.  She denies any injury.  She has not noted rash but states that this is not uncommon for her as sometimes she does not get a full rash with her shingles.  She reports that the pain is like "a bunch of wasps stung me in my left arm."  She denies any fevers.  She denies any numbness or tingling.  She attempted to take her acyclovir but vomited.  She states she has had difficulty tolerating acyclovir recently.  Past Medical History:  Diagnosis Date  . Breast cancer ([place])    In remission  . Fluttering heart   . Gastritis   . Migraines   . Peripheral neuropathy   . Shingles    Recurrent    Patient Active Problem List   Diagnosis Date Noted  . Protein-calorie malnutrition, severe ([place]) 09/20/2012  . Hepatocellular injury 09/17/2012  . Transaminitis 09/17/2012  . Nausea and vomiting 09/17/2012  . Shingles 09/17/2012  . History of breast cancer 09/17/2012    Past Surgical History:  Procedure Laterality Date  . ABDOMINAL HYSTERECTOMY    . APPENDECTOMY    .  CHOLECYSTECTOMY    . TOTAL MASTECTOMY       OB History   No obstetric history on file.      Home Medications    Prior to Admission medications   Medication Sig Start Date End Date Taking? Authorizing Provider  azelastine (ASTELIN) 137 MCG/SPRAY nasal spray Place 1 spray into the nose 2 (two) times daily as needed for rhinitis.   Yes [provider]  ondansetron (ZOFRAN ODT) 4 MG disintegrating tablet Take 1 tablet (4 mg total) by mouth every 8 (eight) hours as needed for nausea or vomiting. 03/26/18  Yes Carlisle Cater, PA-C  promethazine (PHENERGAN) 12.5 MG tablet Take 1 tablet (12.5 mg total) by mouth every 6 (six) hours as needed for nausea. 09/22/12  Yes Rai, Ripudeep K, MD  traZODone (DESYREL) 50 MG tablet Take 50 mg by mouth at bedtime as needed for sleep or depression.   Yes [provider]  warfarin (COUMADIN) 4 MG tablet Take 4-6 mg by mouth daily with breakfast. Takes 4mg  for 2 days and takes 6mg  on day 3; then repeats the cycle.   Yes [provider]  feeding supplement (RESOURCE BREEZE) LIQD Take 1 Container by mouth 3 (three) times daily between meals. 09/22/12   Rai, Vernelle Emerald, MD  meperidine (DEMEROL) 50 MG tablet  Take 50-100 mg by mouth every 4 (four) hours as needed for pain.    [provider]  omeprazole (PRILOSEC) 40 MG capsule Take 40 mg by mouth daily.    [provider]  oxyCODONE-acetaminophen (PERCOCET/ROXICET) 5-325 MG tablet Take 1 tablet by mouth every 6 (six) hours as needed for severe pain. 08/21/18   Horton, Barbette Hair, MD  temazepam (RESTORIL) 30 MG capsule Take 30 mg by mouth at bedtime as needed for sleep.    [provider]  valACYclovir (VALTREX) 1000 MG tablet Take 1 tablet (1,000 mg total) by mouth 3 (three) times daily. Till 09/24/12 then take daily 09/22/12   Mendel Corning, MD    Family History History reviewed. No pertinent family history.  Social History Social History   Tobacco Use  .  Smoking status: Never Smoker  . Smokeless tobacco: Never Used  Substance Use Topics  . Alcohol use: No    Comment: States she has never been a heavy drinker and last EtOH was 2 years ago when husband died.  . Drug use: No     Allergies   Cephalosporins, Penicillins, Codeine, Compazine [prochlorperazine edisylate], and Morphine and related   Review of Systems Review of Systems  Constitutional: Negative for fever.  Respiratory: Negative for cough.   Cardiovascular: Negative for chest pain.  Gastrointestinal: Negative for abdominal pain.  Musculoskeletal:       Left arm pain  Skin: Negative for rash.  Neurological: Negative for weakness and numbness.  All other systems reviewed and are negative.    Physical Exam Updated Vital Signs BP (!) 184/101 (BP Location: Right Arm) Comment: pt in pain  Pulse 85   Temp 97.9 F (36.6 C) (Oral)   Resp 16   Ht 1.518 m (4' 11.75")   Wt 50.8 kg   SpO2 100%   BMI 22.06 kg/m   Physical Exam Vitals signs and nursing note reviewed.  Constitutional:      Appearance: She is well-developed. She is not ill-appearing.  HENT:     Head: Normocephalic and atraumatic.  Eyes:     Pupils: Pupils are equal, round, and reactive to light.  Cardiovascular:     Rate and Rhythm: Normal rate and regular rhythm.     Heart sounds: Normal heart sounds.  Pulmonary:     Effort: Pulmonary effort is normal. No respiratory distress.     Breath sounds: No wheezing.  Abdominal:     Palpations: Abdomen is soft.     Tenderness: There is no abdominal tenderness.  Musculoskeletal:        General: No tenderness, deformity or signs of injury.     Comments: 2+ radial pulses bilaterally  Skin:    General: Skin is warm and dry.     Comments: Focused examination of the left upper extremity with tenderness to light touch over the medial aspect of the left upper arm and axilla, no obvious rash  Neurological:     Mental Status: She is alert and oriented to person,  place, and time.  Psychiatric:        Mood and Affect: Mood normal.      ED Treatments / Results  Labs (all labs ordered are listed, but only abnormal results are displayed) Labs Reviewed - No data to display  EKG None  Radiology No results found.  Procedures Procedures (including critical care time)  Medications Ordered in ED Medications  oxyCODONE-acetaminophen (PERCOCET/ROXICET) 5-325 MG per tablet 1 tablet (1 tablet Oral Given 08/20/18  2352)  ondansetron (ZOFRAN-ODT) disintegrating tablet 4 mg (4 mg Oral Given 08/20/18 2352)  ketorolac (TORADOL) 30 MG/ML injection 15 mg (15 mg Intramuscular Given 08/21/18 0105)  gabapentin (NEURONTIN) capsule 300 mg (300 mg Oral Given 08/21/18 0102)  fentaNYL (SUBLIMAZE) injection 50 mcg (50 mcg Intramuscular Given 08/21/18 0104)     Initial Impression / Assessment and Plan / ED Course  I have reviewed the triage vital signs and the nursing notes.  Pertinent labs & imaging results that were available during my care of the patient were reviewed by me and considered in my medical decision making (see chart for details).  Clinical Course as of Aug 20 109  Mon Aug 21, 2018  0044 No improvement of pain with Percocet.  Patient was given gabapentin, Toradol, and fentanyl IM.  Will reassess.   [CH]  0110 Patient requests discharge.  Some improvement after medications given.  She has follow-up with her primary physician on Monday.  We will give a short course of Percocet for home.   [CH]    Clinical Course User Index [CH] Horton, Barbette Hair, MD       Patient presents with left arm pain.  She has had pain like this in the past related to shingles.  She is overall nontoxic-appearing and vital signs are reassuring.  She has recently had an ultrasound that rules out DVT in the left upper extremity given her history.  There are no obvious deformities or abnormalities on exam.  No obvious skin changes.  There is no obvious rash to suggest shingles  although prodrome to shingles would be a consideration.  Also given her prior mastectomy, patient likely has issues with lymph drainage from that extremity.  See clinical course above.  At this time I do not feel she needs any emergent imaging or testing.  She was given medications and will follow-up closely with her primary physician.  After history, exam, and medical workup I feel the patient has been appropriately medically screened and is safe for discharge home. Pertinent diagnoses were discussed with the patient. Patient was given return precautions.   Final Clinical Impressions(s) / ED Diagnoses   Final diagnoses:  Left arm pain    ED Discharge Orders         Ordered    oxyCODONE-acetaminophen (PERCOCET/ROXICET) 5-325 MG tablet  Every 6 hours PRN     08/21/18 0110           Merryl Hacker, MD 08/21/18 [phone]

## 2018-08-20 NOTE — ED Notes (Signed)
ED Provider at bedside. 

## 2018-08-21 DIAGNOSIS — M79622 Pain in left upper arm: Secondary | ICD-10-CM | POA: Diagnosis not present

## 2018-08-21 MED ORDER — GABAPENTIN 300 MG PO CAPS
300.0000 mg | ORAL_CAPSULE | Freq: Once | ORAL | Status: AC
  Administered 2018-08-21: 300 mg via ORAL
  Filled 2018-08-21: qty 1

## 2018-08-21 MED ORDER — KETOROLAC TROMETHAMINE 30 MG/ML IJ SOLN
15.0000 mg | Freq: Once | INTRAMUSCULAR | Status: AC
  Administered 2018-08-21: 15 mg via INTRAMUSCULAR
  Filled 2018-08-21: qty 1

## 2018-08-21 MED ORDER — OXYCODONE-ACETAMINOPHEN 5-325 MG PO TABS: 1.0000 | ORAL_TABLET | Freq: Four times a day (QID) | ORAL | 0 refills | Status: AC | PRN

## 2018-08-21 MED ORDER — FENTANYL CITRATE (PF) 100 MCG/2ML IJ SOLN
50.0000 ug | Freq: Once | INTRAMUSCULAR | Status: AC
  Administered 2018-08-21: 50 ug via INTRAMUSCULAR
  Filled 2018-08-21: qty 2

## 2018-08-21 NOTE — Discharge Instructions (Signed)
You were seen today for left arm pain.  This could be true prodromal shingles or related to your neuropathy or prior mastectomy.  It is very important that you follow-up with your primary physician as scheduled on Monday.  If you develop any new or worsening symptoms you should be reevaluated.

## 2018-12-22 ENCOUNTER — Emergency Department (HOSPITAL_BASED_OUTPATIENT_CLINIC_OR_DEPARTMENT_OTHER): Payer: Medicare Other

## 2018-12-22 ENCOUNTER — Other Ambulatory Visit: Payer: Self-pay

## 2018-12-22 ENCOUNTER — Encounter (HOSPITAL_BASED_OUTPATIENT_CLINIC_OR_DEPARTMENT_OTHER): Payer: Self-pay | Admitting: Emergency Medicine

## 2018-12-22 ENCOUNTER — Emergency Department (HOSPITAL_BASED_OUTPATIENT_CLINIC_OR_DEPARTMENT_OTHER)
Admission: EM | Admit: 2018-12-22 | Discharge: 2018-12-22 | Disposition: A | Discharge status: Home / Self Care | Payer: Medicare Other | Attending: Emergency Medicine | Admitting: Emergency Medicine

## 2018-12-22 DIAGNOSIS — Z885 Allergy status to narcotic agent status: Secondary | ICD-10-CM | POA: Diagnosis not present

## 2018-12-22 DIAGNOSIS — Z881 Allergy status to other antibiotic agents status: Secondary | ICD-10-CM | POA: Diagnosis not present

## 2018-12-22 DIAGNOSIS — Z888 Allergy status to other drugs, medicaments and biological substances status: Secondary | ICD-10-CM | POA: Diagnosis not present

## 2018-12-22 DIAGNOSIS — Z79899 Other long term (current) drug therapy: Secondary | ICD-10-CM | POA: Diagnosis not present

## 2018-12-22 DIAGNOSIS — J0100 Acute maxillary sinusitis, unspecified: Secondary | ICD-10-CM | POA: Diagnosis not present

## 2018-12-22 DIAGNOSIS — Z880 Allergy status to penicillin: Secondary | ICD-10-CM | POA: Insufficient documentation

## 2018-12-22 DIAGNOSIS — Z20828 Contact with and (suspected) exposure to other viral communicable diseases: Secondary | ICD-10-CM | POA: Diagnosis not present

## 2018-12-22 DIAGNOSIS — R519 Headache, unspecified: Secondary | ICD-10-CM

## 2018-12-22 DIAGNOSIS — Z853 Personal history of malignant neoplasm of breast: Secondary | ICD-10-CM | POA: Insufficient documentation

## 2018-12-22 DIAGNOSIS — R0602 Shortness of breath: Secondary | ICD-10-CM | POA: Diagnosis present

## 2018-12-22 DIAGNOSIS — G44209 Tension-type headache, unspecified, not intractable: Secondary | ICD-10-CM | POA: Diagnosis not present

## 2018-12-22 LAB — CBC WITH DIFFERENTIAL/PLATELET
Abs Immature Granulocytes: 0.02 10*3/uL (ref 0.00–0.07)
Basophils Absolute: 0.1 10*3/uL (ref 0.0–0.1)
Basophils Relative: 1 %
Eosinophils Absolute: 0.1 10*3/uL (ref 0.0–0.5)
Eosinophils Relative: 1 %
HCT: 40.9 % (ref 36.0–46.0)
Hemoglobin: 13.3 g/dL (ref 12.0–15.0)
Immature Granulocytes: 0 %
Lymphocytes Relative: 18 %
Lymphs Abs: 1.7 10*3/uL (ref 0.7–4.0)
MCH: 29.3 pg (ref 26.0–34.0)
MCHC: 32.5 g/dL (ref 30.0–36.0)
MCV: 90.1 fL (ref 80.0–100.0)
Monocytes Absolute: 0.9 10*3/uL (ref 0.1–1.0)
Monocytes Relative: 9 %
Neutro Abs: 6.6 10*3/uL (ref 1.7–7.7)
Neutrophils Relative %: 71 %
Platelets: 272 10*3/uL (ref 150–400)
RBC: 4.54 MIL/uL (ref 3.87–5.11)
RDW: 12.6 % (ref 11.5–15.5)
WBC: 9.3 10*3/uL (ref 4.0–10.5)
nRBC: 0 % (ref 0.0–0.2)

## 2018-12-22 LAB — BASIC METABOLIC PANEL
Anion gap: 9 (ref 5–15)
BUN: 11 mg/dL (ref 8–23)
CO2: 25 mmol/L (ref 22–32)
Calcium: 9 mg/dL (ref 8.9–10.3)
Chloride: 106 mmol/L (ref 98–111)
Creatinine, Ser: 0.59 mg/dL (ref 0.44–1.00)
GFR calc Af Amer: >60 mL/min (ref >60)
GFR calc non Af Amer: >60 mL/min (ref >60)
Glucose, Bld: 100 mg/dL — ABNORMAL HIGH (ref 70–99)
Potassium: 4 mmol/L (ref 3.5–5.1)
Sodium: 140 mmol/L (ref 135–145)

## 2018-12-22 LAB — SARS CORONAVIRUS 2 (TAT 6-24 HRS): SARS Coronavirus 2: NEGATIVE

## 2018-12-22 MED ORDER — DOXYCYCLINE HYCLATE 100 MG PO CAPS: 100.0000 mg | ORAL_CAPSULE | Freq: Two times a day (BID) | ORAL | 0 refills | Status: AC

## 2018-12-22 MED ORDER — DOXYCYCLINE HYCLATE 100 MG PO TABS
100.0000 mg | ORAL_TABLET | Freq: Once | ORAL | Status: AC
  Administered 2018-12-22: 100 mg via ORAL
  Filled 2018-12-22: qty 1

## 2018-12-22 MED ORDER — ONDANSETRON 4 MG PO TBDP: 4.0000 mg | ORAL_TABLET | Freq: Three times a day (TID) | ORAL | 0 refills | Status: DC | PRN

## 2018-12-22 MED ORDER — DIPHENHYDRAMINE HCL 50 MG/ML IJ SOLN
25.0000 mg | Freq: Once | INTRAMUSCULAR | Status: AC
  Administered 2018-12-22: 25 mg via INTRAVENOUS
  Filled 2018-12-22: qty 1

## 2018-12-22 MED ORDER — HYDROCODONE-ACETAMINOPHEN 5-325 MG PO TABS: 1.0000 | ORAL_TABLET | ORAL | 0 refills | Status: AC | PRN

## 2018-12-22 MED ORDER — OXYMETAZOLINE HCL 0.05 % NA SOLN
1.0000 | Freq: Once | NASAL | Status: AC
  Administered 2018-12-22: 1 via NASAL
  Filled 2018-12-22: qty 30

## 2018-12-22 MED ORDER — METOCLOPRAMIDE HCL 5 MG/ML IJ SOLN
10.0000 mg | Freq: Once | INTRAMUSCULAR | Status: AC
  Administered 2018-12-22: 10 mg via INTRAVENOUS
  Filled 2018-12-22: qty 2

## 2018-12-22 MED ORDER — SODIUM CHLORIDE 0.9 % IV BOLUS
1000.0000 mL | Freq: Once | INTRAVENOUS | Status: AC
  Administered 2018-12-22: 1000 mL via INTRAVENOUS

## 2018-12-22 MED ORDER — ONDANSETRON HCL 4 MG/2ML IJ SOLN
4.0000 mg | Freq: Once | INTRAMUSCULAR | Status: AC
  Administered 2018-12-22: 4 mg via INTRAVENOUS
  Filled 2018-12-22: qty 2

## 2018-12-22 MED ORDER — FENTANYL CITRATE (PF) 100 MCG/2ML IJ SOLN
25.0000 ug | Freq: Once | INTRAMUSCULAR | Status: AC
  Administered 2018-12-22: 25 ug via INTRAVENOUS
  Filled 2018-12-22: qty 2

## 2018-12-22 MED FILL — DOXYCYCLINE HYCLATE 100 MG: 100 | 7 days supply | Qty: 14 | Fill #0

## 2018-12-22 MED FILL — ONDANSETRON ODT 4 MG TABLET: 4 | 3 days supply | Qty: 10 | Fill #0

## 2018-12-22 NOTE — ED Provider Notes (Signed)
[facility] EMERGENCY DEPARTMENT Provider Note   CSN: [id number] Arrival date & time: 12/22/18  1041     History   Chief Complaint Chief Complaint  Patient presents with  . Shortness of Breath    HPI Monica Khan is a 73 y.o. female.     Pt presents to the ED today with a headache, sinus congestion, left ear pain, and sob.  The pt said sx have been getting worse for the past few days, but became worse last night.  The pt has had low grade fevers and feels poorly.  She did a phone visit with her pcp hoping to get an antibiotic, but was sent to the ED for further work up.  She denies any sick contacts or any known covid exposures.     Past Medical History:  Diagnosis Date  . Breast cancer ([place])    In remission  . Fluttering heart   . Gastritis   . Migraines   . Peripheral neuropathy   . Shingles    Recurrent    Patient Active Problem List   Diagnosis Date Noted  . Protein-calorie malnutrition, severe ([place]) 09/20/2012  . Hepatocellular injury 09/17/2012  . Transaminitis 09/17/2012  . Nausea and vomiting 09/17/2012  . Shingles 09/17/2012  . History of breast cancer 09/17/2012    Past Surgical History:  Procedure Laterality Date  . ABDOMINAL HYSTERECTOMY    . APPENDECTOMY    . CHOLECYSTECTOMY    . TOTAL MASTECTOMY       OB History   No obstetric history on file.      Home Medications    Prior to Admission medications   Medication Sig Start Date End Date Taking? Authorizing Provider  azelastine (ASTELIN) 137 MCG/SPRAY nasal spray Place 1 spray into the nose 2 (two) times daily as needed for rhinitis.    [provider]  doxycycline (VIBRAMYCIN) 100 MG capsule Take 1 capsule (100 mg total) by mouth 2 (two) times daily. 12/22/18   Isla Pence, MD  feeding supplement (RESOURCE BREEZE) LIQD Take 1 Container by mouth 3 (three) times daily between meals. 09/22/12   Rai, Vernelle Emerald, MD  HYDROcodone-acetaminophen (NORCO/VICODIN) 5-325 MG  tablet Take 1 tablet by mouth every 4 (four) hours as needed. 12/22/18   Isla Pence, MD  meperidine (DEMEROL) 50 MG tablet Take 50-100 mg by mouth every 4 (four) hours as needed for pain.    [provider]  omeprazole (PRILOSEC) 40 MG capsule Take 40 mg by mouth daily.    [provider]  ondansetron (ZOFRAN ODT) 4 MG disintegrating tablet Take 1 tablet (4 mg total) by mouth every 8 (eight) hours as needed. 12/22/18   Isla Pence, MD  oxyCODONE-acetaminophen (PERCOCET/ROXICET) 5-325 MG tablet Take 1 tablet by mouth every 6 (six) hours as needed for severe pain. 08/21/18   Horton, Barbette Hair, MD  promethazine (PHENERGAN) 12.5 MG tablet Take 1 tablet (12.5 mg total) by mouth every 6 (six) hours as needed for nausea. 09/22/12   Rai, Ripudeep K, MD  temazepam (RESTORIL) 30 MG capsule Take 30 mg by mouth at bedtime as needed for sleep.    [provider]  traZODone (DESYREL) 50 MG tablet Take 50 mg by mouth at bedtime as needed for sleep or depression.    [provider]  valACYclovir (VALTREX) 1000 MG tablet Take 1 tablet (1,000 mg total) by mouth 3 (three) times daily. Till 09/24/12 then take daily 09/22/12   Mendel Corning, MD  warfarin (  COUMADIN) 4 MG tablet Take 4-6 mg by mouth daily with breakfast. Takes 4mg  for 2 days and takes 6mg  on day 3; then repeats the cycle.    [provider]    Family History No family history on file.  Social History Social History   Tobacco Use  . Smoking status: Never Smoker  . Smokeless tobacco: Never Used  Substance Use Topics  . Alcohol use: No    Comment: States she has never been a heavy drinker and last EtOH was 2 years ago when husband died.  . Drug use: No     Allergies   Cephalosporins, Penicillins, Codeine, Compazine [prochlorperazine edisylate], and Morphine and related   Review of Systems Review of Systems  Constitutional: Positive for fatigue.  HENT: Positive for congestion, ear pain and  sinus pain.   Respiratory: Positive for cough and shortness of breath.   Neurological: Positive for headaches.  All other systems reviewed and are negative.    Physical Exam Updated Vital Signs BP (!) 149/72 (BP Location: Left Arm)   Pulse 80   Temp 98.1 F (36.7 C) (Oral)   Resp 16   Ht 4' 11.75" (1.518 m)   Wt 49.9 kg   SpO2 100%   BMI 21.66 kg/m   Physical Exam Vitals signs and nursing note reviewed.  Constitutional:      Appearance: She is well-developed.  HENT:     Head: Normocephalic and atraumatic.     Mouth/Throat:     Mouth: Mucous membranes are moist.  Eyes:     Pupils: Pupils are equal, round, and reactive to light.  Neck:     Musculoskeletal: Normal range of motion and neck supple.  Cardiovascular:     Rate and Rhythm: Normal rate and regular rhythm.  Pulmonary:     Effort: Pulmonary effort is normal.     Breath sounds: Normal breath sounds.  Abdominal:     General: Bowel sounds are normal.     Palpations: Abdomen is soft.  Musculoskeletal: Normal range of motion.  Skin:    General: Skin is warm and dry.     Capillary Refill: Capillary refill takes less than 2 seconds.  Neurological:     General: No focal deficit present.     Mental Status: She is alert and oriented to person, place, and time.  Psychiatric:        Mood and Affect: Mood normal.        Behavior: Behavior normal.      ED Treatments / Results  Labs (all labs ordered are listed, but only abnormal results are displayed) Labs Reviewed  BASIC METABOLIC PANEL - Abnormal; Notable for the following components:      Result Value   Glucose, Bld 100 (*)    All other components within normal limits  SARS CORONAVIRUS 2 (TAT 6-24 HRS)  CBC WITH DIFFERENTIAL/PLATELET    EKG None  Radiology Dg Chest Portable 1 View  Result Date: 12/22/2018 CLINICAL DATA:  Cough, shortness of breath EXAM: PORTABLE CHEST 1 VIEW COMPARISON:  07/31/2018 FINDINGS: The heart size and mediastinal contours  are within normal limits. Both lungs are clear. The visualized skeletal structures are unremarkable. Surgical clips within the bilateral axillary regions. IMPRESSION: No acute cardiopulmonary findings. Electronically Signed   By: Davina Poke M.D.   On: 12/22/2018 11:33    Procedures Procedures (including critical care time)  Medications Ordered in ED Medications  sodium chloride 0.9 % bolus 1,000 mL ( Intravenous Stopped 12/22/18 1239)  metoCLOPramide (REGLAN) injection 10 mg (10 mg Intravenous Given 12/22/18 1133)  diphenhydrAMINE (BENADRYL) injection 25 mg (25 mg Intravenous Given 12/22/18 1133)  oxymetazoline (AFRIN) 0.05 % nasal spray 1 spray (1 spray Each Nare Given 12/22/18 1136)  doxycycline (VIBRA-TABS) tablet 100 mg (100 mg Oral Given 12/22/18 1251)  fentaNYL (SUBLIMAZE) injection 25 mcg (25 mcg Intravenous Given 12/22/18 1254)  ondansetron (ZOFRAN) injection 4 mg (4 mg Intravenous Given 12/22/18 1252)     Initial Impression / Assessment and Plan / ED Course  I have reviewed the triage vital signs and the nursing notes.  Pertinent labs & imaging results that were available during my care of the patient were reviewed by me and considered in my medical decision making (see chart for details).       Pt is feeling much better.  She likely has sinusitis, but a covid test has been sent.  Pt knows to self-isolate until test comes back.  She is oxygenating well.  She looks nontoxic.  She knows to return if worse.  F/u with pcp.  Final Clinical Impressions(s) / ED Diagnoses   Final diagnoses:  Acute non-recurrent maxillary sinusitis  Nonintractable headache, unspecified chronicity pattern, unspecified headache type    ED Discharge Orders         Ordered    doxycycline (VIBRAMYCIN) 100 MG capsule  2 times daily     12/22/18 1333    HYDROcodone-acetaminophen (NORCO/VICODIN) 5-325 MG tablet  Every 4 hours PRN     12/22/18 1333    ondansetron (ZOFRAN ODT) 4 MG disintegrating  tablet  Every 8 hours PRN     12/22/18 1333           Isla Pence, MD 12/22/18 1334

## 2018-12-22 NOTE — ED Triage Notes (Signed)
SOB, sore throat, cough, Migraine for a couple days.  Sent to ED by pmd.

## 2019-05-21 IMAGING — CT CT ANGIO CHEST
2 of 8 series · 19 of 36 positions shown · IV contrast (iopamidol)
Comparison: Chest CT October 07, 2016

CLINICAL DATA: Shortness of breath and pain

EXAM:
CT ANGIOGRAPHY CHEST WITH CONTRAST
TECHNIQUE: Multidetector CT imaging of the chest was performed using the
standard protocol during bolus administration of intravenous
contrast. Multiplanar CT image reconstructions and MIPs were
obtained to evaluate the vascular anatomy.
CONTRAST:  100mL 8F6P0C-HLE IOPAMIDOL (8F6P0C-HLE) INJECTION 76%

[Series 6: pe coronal mpr · coronal · 0.50mm/px · 1 of 111 slices shown]
[im 56/111  mediastinal]
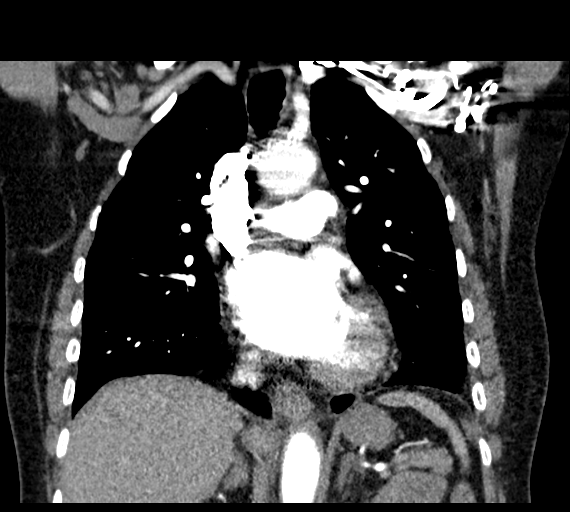

[Series 10: pe thins · axial · 0.59mm/px · z∈[-250,-29]mm · 18 of 249 slices shown]
[im 14/249  lung]
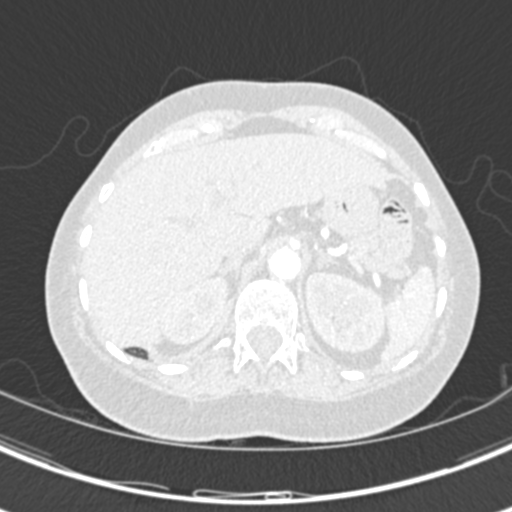
[im 27/249  mediastinal]
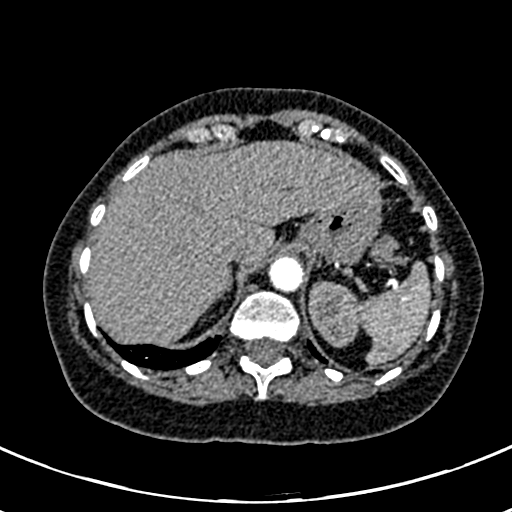
[im 40/249  lung]
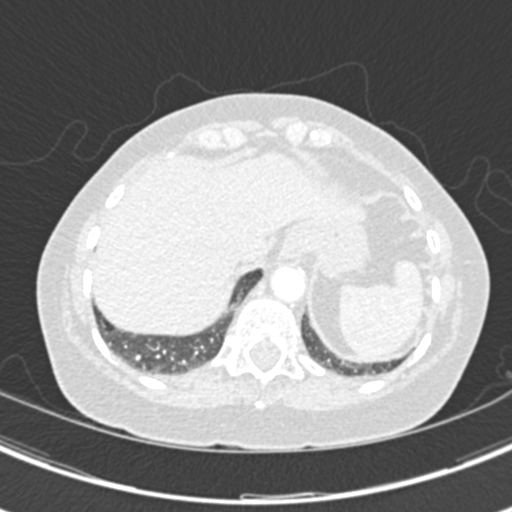
[im 53/249  mediastinal]
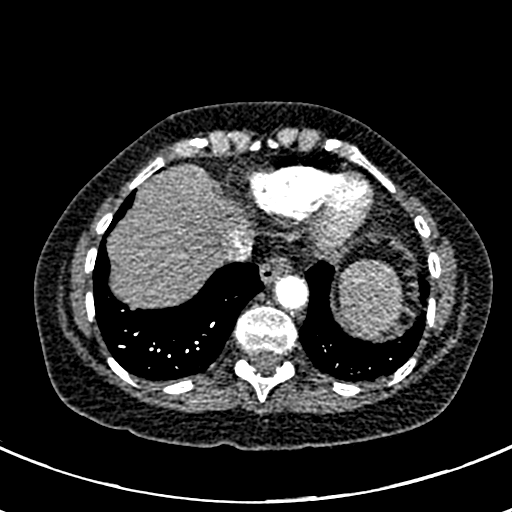
[im 66/249  lung]
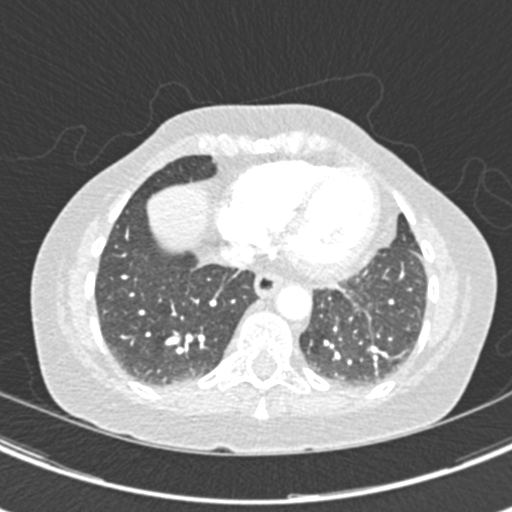
[im 79/249  mediastinal]
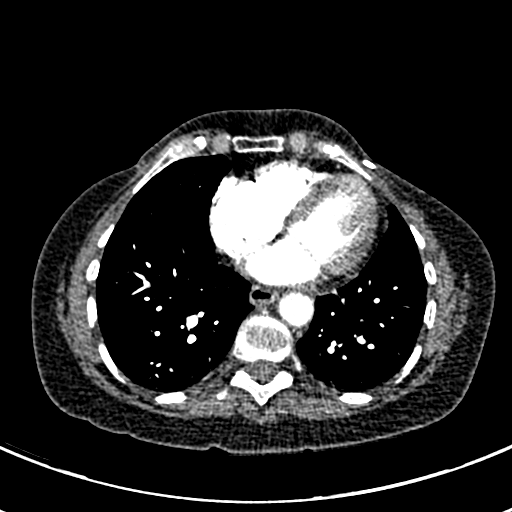
[im 92/249  lung]
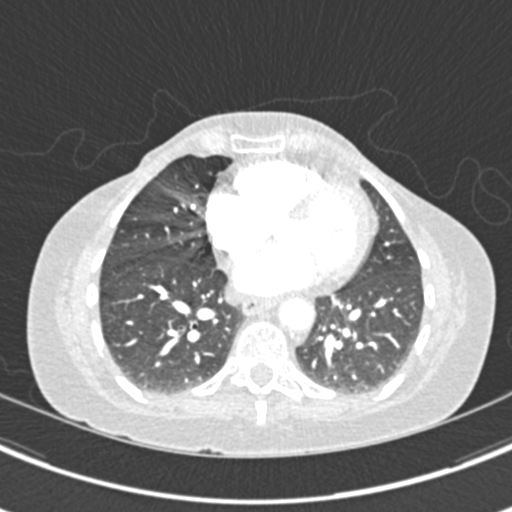
[im 105/249  mediastinal]
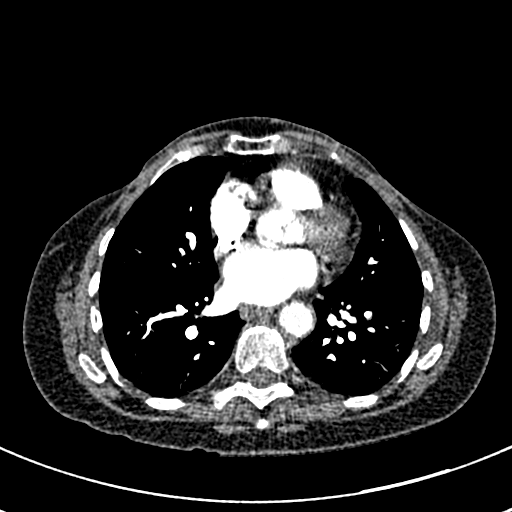
[im 118/249  lung]
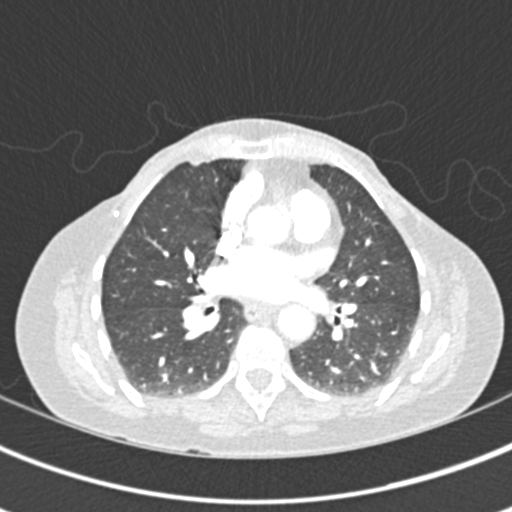
[im 131/249  mediastinal]
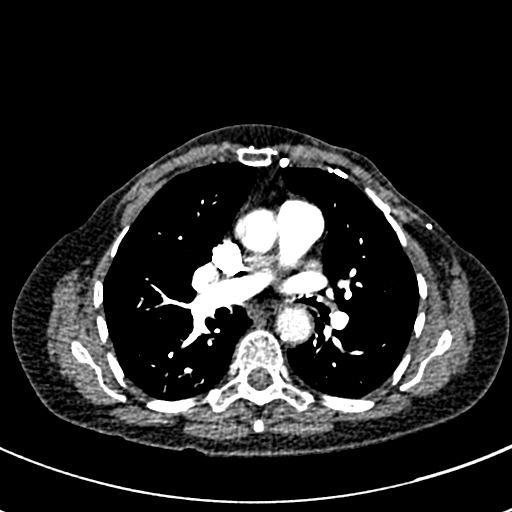
[im 144/249  lung]
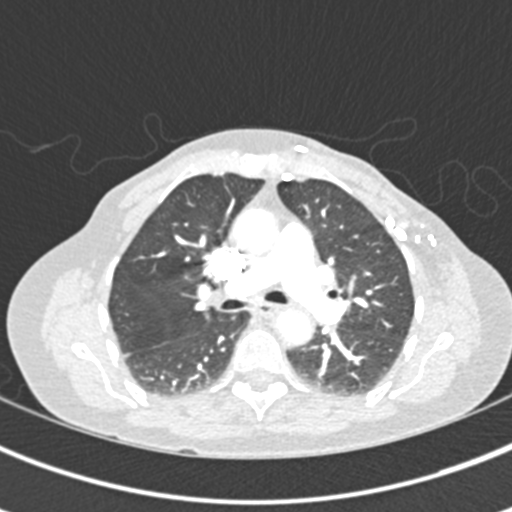
[im 157/249  mediastinal]
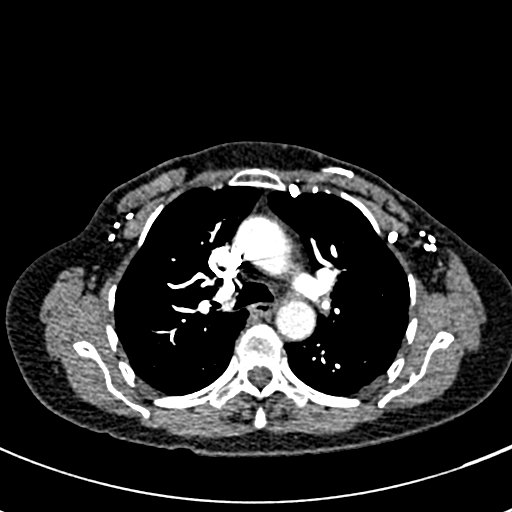
[im 170/249  lung]
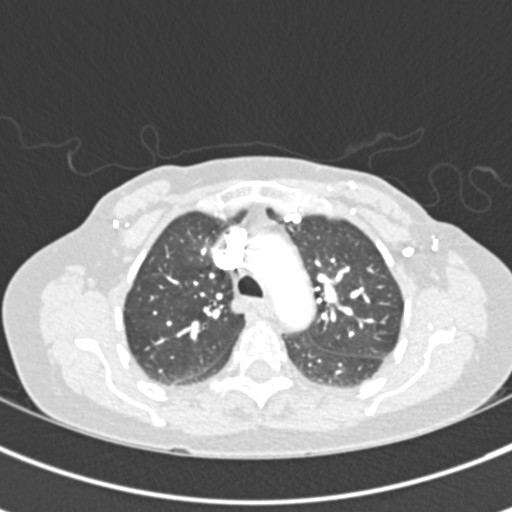
[im 183/249  mediastinal]
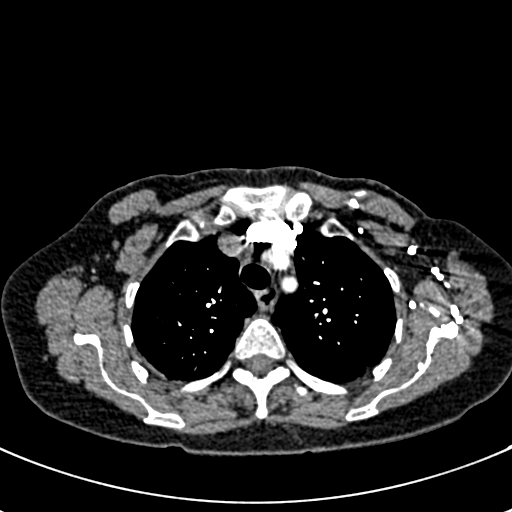
[im 196/249  lung]
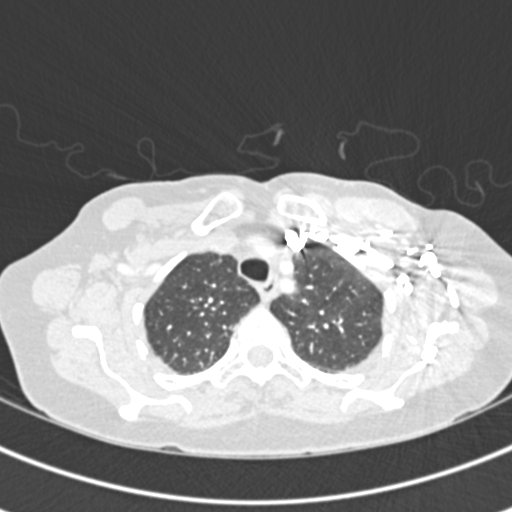
[im 209/249  mediastinal]
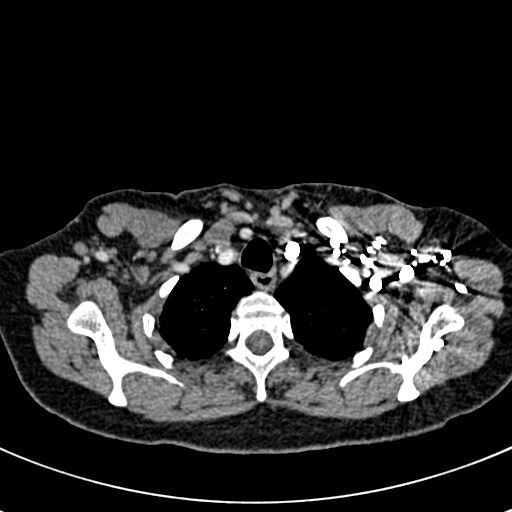
[im 222/249  lung]
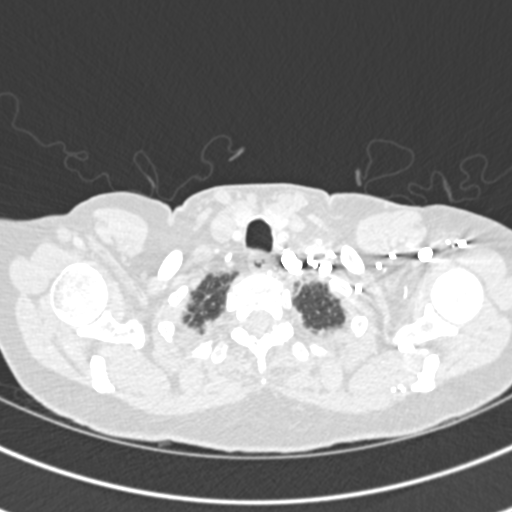
[im 235/249  mediastinal]
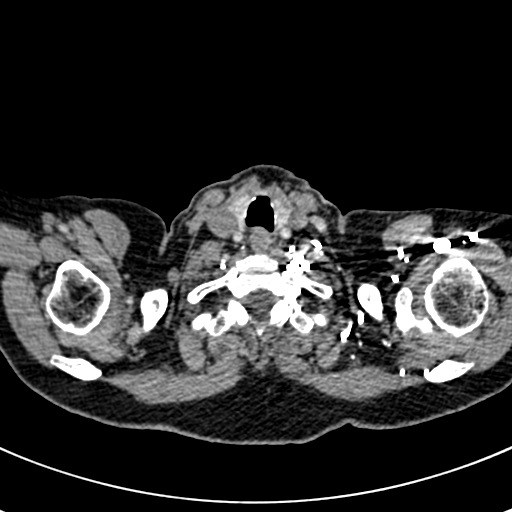

[19 of 36 positions shown; findings below may reference images not displayed]

FINDINGS: Cardiovascular: There is no demonstrable pulmonary embolus. There is
no thoracic aortic aneurysm or dissection. The visualized great
vessels appear unremarkable. There is no pericardial effusion or
pericardial thickening. There is modest coronary artery
calcification. There is mild aortic atherosclerotic calcification in
the aorta.

Mediastinum/Nodes: Thyroid appears unremarkable. There is no
demonstrable thoracic adenopathy. There is a small hiatal hernia.

Lungs/Pleura: There is slight bibasilar atelectatic change. There is
no appreciable edema or consolidation. No appreciable pleural
effusion or pleural thickening.

Upper Abdomen: Visualized upper abdominal structures appear
unremarkable.

Musculoskeletal: There are no blastic or lytic bone lesions. No
evident chest wall lesions.

Review of the MIP images confirms the above findings.
IMPRESSION: 1. No demonstrable pulmonary embolus. No thoracic aortic aneurysm or
dissection. Modest coronary artery calcification noted. Mild
thoracic aortic atherosclerosis.

2.  Mild bibasilar atelectasis.  No edema or consolidation evident.

3.  No appreciable thoracic adenopathy.

4.  Small hiatal hernia.

Aortic Atherosclerosis ([id number]).

## 2019-05-21 IMAGING — CR DG WRIST COMPLETE 3+V*R*
4 series · 4 of 4 positions shown · non-contrast
Comparison: None.

CLINICAL DATA: Acute onset of diffuse RIGHT wrist pain that began
last night. No known injuries.

EXAM:
RIGHT WRIST - COMPLETE 3+ VIEW

[x wrist pa right]
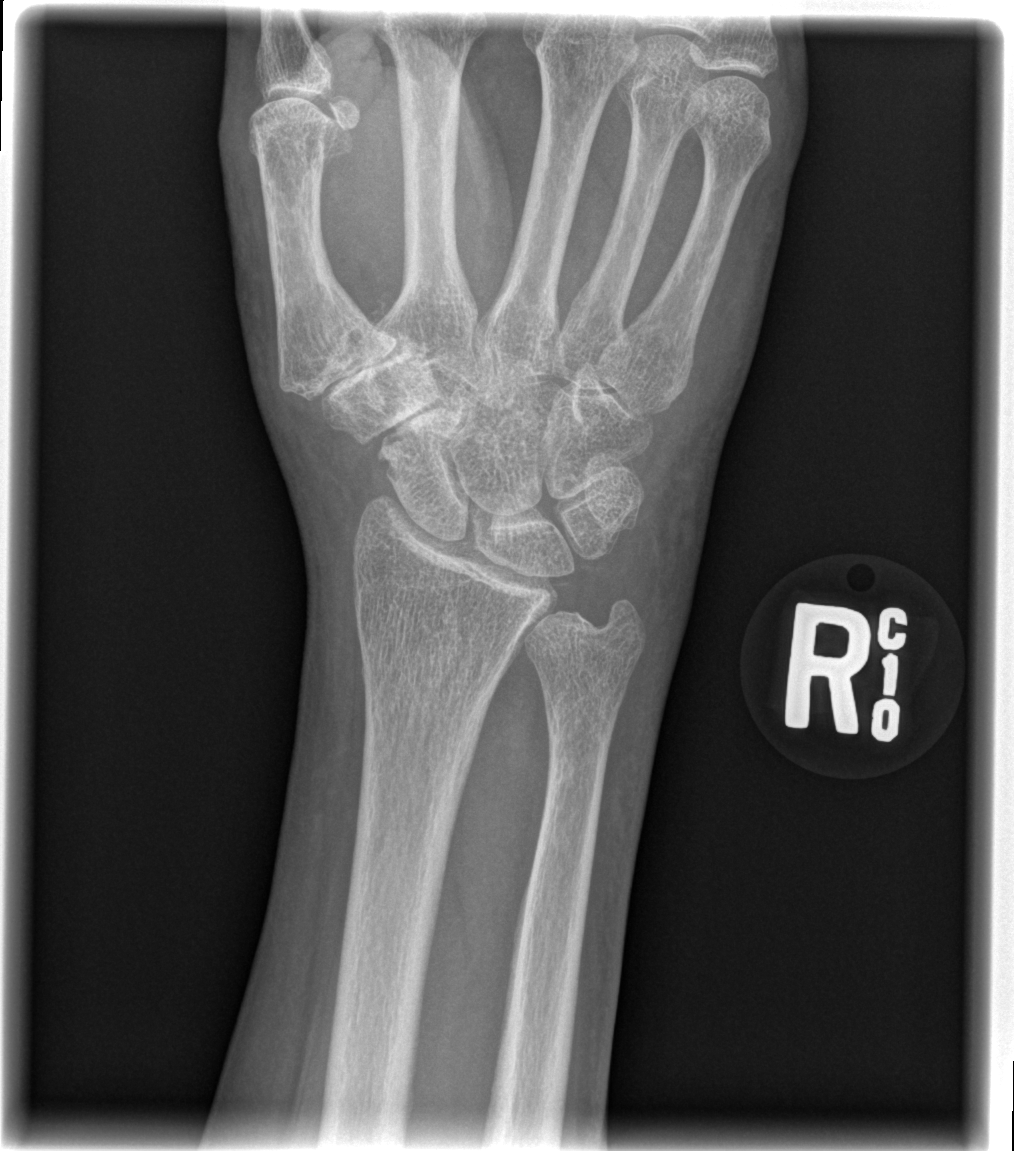

[x wrist obl right]
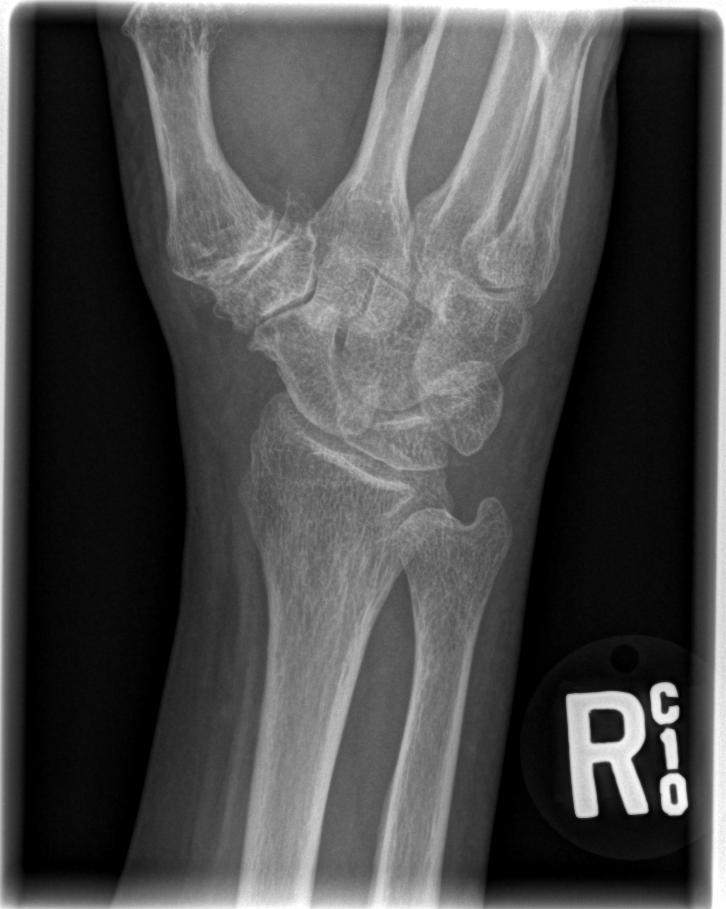

[x wrist lat right]
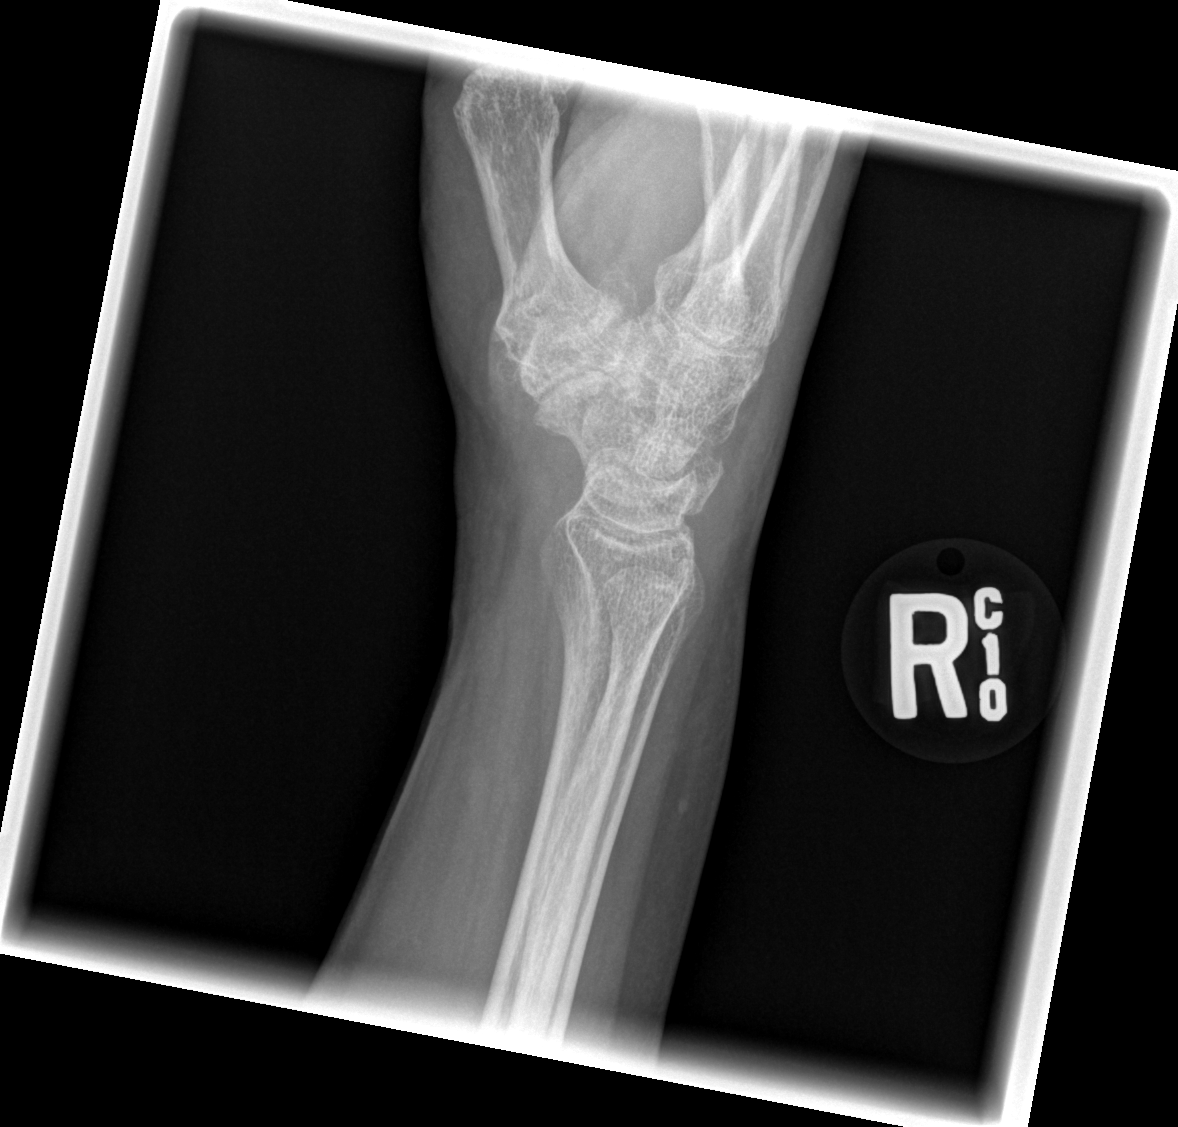

[x navicular]
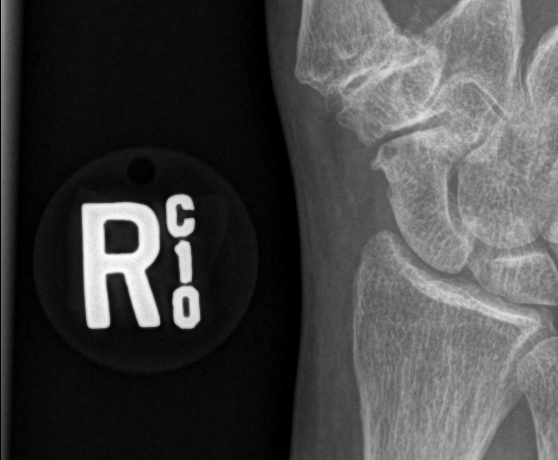

[4 of 4 positions shown; findings below may reference images not displayed]

FINDINGS: No evidence of acute, subacute or healed fractures. Severe narrowing
of the scaphotrapezium and the trapezium-first metacarpal joint
spaces. Associated spurring and possible calcified loose bodies in
the trapezium-first metacarpal joint. Remaining joint spaces
well-preserved. Mild osseous demineralization.
IMPRESSION: No acute or subacute osseous abnormality. Severe osteoarthritis
involving the scaphotrapezium and trapezium-first metacarpal joints.

## 2019-08-01 ENCOUNTER — Emergency Department (HOSPITAL_BASED_OUTPATIENT_CLINIC_OR_DEPARTMENT_OTHER): Payer: Medicare Other

## 2019-08-01 ENCOUNTER — Emergency Department (HOSPITAL_BASED_OUTPATIENT_CLINIC_OR_DEPARTMENT_OTHER)
Admission: EM | Admit: 2019-08-01 | Discharge: 2019-08-01 | Disposition: A | Discharge status: Home / Self Care | Payer: Medicare Other | Attending: Emergency Medicine | Admitting: Emergency Medicine

## 2019-08-01 ENCOUNTER — Encounter (HOSPITAL_BASED_OUTPATIENT_CLINIC_OR_DEPARTMENT_OTHER): Payer: Self-pay | Admitting: *Deleted

## 2019-08-01 ENCOUNTER — Other Ambulatory Visit: Payer: Self-pay

## 2019-08-01 DIAGNOSIS — Z885 Allergy status to narcotic agent status: Secondary | ICD-10-CM | POA: Insufficient documentation

## 2019-08-01 DIAGNOSIS — R079 Chest pain, unspecified: Secondary | ICD-10-CM | POA: Diagnosis not present

## 2019-08-01 DIAGNOSIS — Z886 Allergy status to analgesic agent status: Secondary | ICD-10-CM | POA: Insufficient documentation

## 2019-08-01 DIAGNOSIS — Z79899 Other long term (current) drug therapy: Secondary | ICD-10-CM | POA: Insufficient documentation

## 2019-08-01 DIAGNOSIS — M79632 Pain in left forearm: Secondary | ICD-10-CM | POA: Diagnosis present

## 2019-08-01 DIAGNOSIS — Z880 Allergy status to penicillin: Secondary | ICD-10-CM | POA: Diagnosis not present

## 2019-08-01 LAB — COMPREHENSIVE METABOLIC PANEL
ALT: 14 U/L (ref 0–44)
AST: 22 U/L (ref 15–41)
Albumin: 4.1 g/dL (ref 3.5–5.0)
Alkaline Phosphatase: 45 U/L (ref 38–126)
Anion gap: 8 (ref 5–15)
BUN: 13 mg/dL (ref 8–23)
CO2: 28 mmol/L (ref 22–32)
Calcium: 8.7 mg/dL — ABNORMAL LOW (ref 8.9–10.3)
Chloride: 103 mmol/L (ref 98–111)
Creatinine, Ser: 0.55 mg/dL (ref 0.44–1.00)
GFR calc Af Amer: >60 mL/min (ref >60)
GFR calc non Af Amer: >60 mL/min (ref >60)
Glucose, Bld: 101 mg/dL — ABNORMAL HIGH (ref 70–99)
Potassium: 4.2 mmol/L (ref 3.5–5.1)
Sodium: 139 mmol/L (ref 135–145)
Total Bilirubin: 0.4 mg/dL (ref 0.3–1.2)
Total Protein: 6.7 g/dL (ref 6.5–8.1)

## 2019-08-01 LAB — CBC WITH DIFFERENTIAL/PLATELET
Abs Immature Granulocytes: 0.01 10*3/uL (ref 0.00–0.07)
Basophils Absolute: 0.1 10*3/uL (ref 0.0–0.1)
Basophils Relative: 1 %
Eosinophils Absolute: 0.3 10*3/uL (ref 0.0–0.5)
Eosinophils Relative: 4 %
HCT: 39.2 % (ref 36.0–46.0)
Hemoglobin: 13 g/dL (ref 12.0–15.0)
Immature Granulocytes: 0 %
Lymphocytes Relative: 35 %
Lymphs Abs: 2.4 10*3/uL (ref 0.7–4.0)
MCH: 30.4 pg (ref 26.0–34.0)
MCHC: 33.2 g/dL (ref 30.0–36.0)
MCV: 91.6 fL (ref 80.0–100.0)
Monocytes Absolute: 0.6 10*3/uL (ref 0.1–1.0)
Monocytes Relative: 10 %
Neutro Abs: 3.4 10*3/uL (ref 1.7–7.7)
Neutrophils Relative %: 50 %
Platelets: 290 10*3/uL (ref 150–400)
RBC: 4.28 MIL/uL (ref 3.87–5.11)
RDW: 12.4 % (ref 11.5–15.5)
WBC: 6.7 10*3/uL (ref 4.0–10.5)
nRBC: 0 % (ref 0.0–0.2)

## 2019-08-01 LAB — TROPONIN I (HIGH SENSITIVITY)
Troponin I (High Sensitivity): 6 ng/L (ref <18)
Troponin I (High Sensitivity): 7 ng/L (ref <18)

## 2019-08-01 LAB — PROTIME-INR
INR: 2.4 — ABNORMAL HIGH (ref 0.8–1.2)
Prothrombin Time: 25 seconds — ABNORMAL HIGH (ref 11.4–15.2)

## 2019-08-01 MED ORDER — ISOSORBIDE MONONITRATE ER 60 MG PO TB24
60.0000 mg | ORAL_TABLET | Freq: Every day | ORAL | Status: DC
  Filled 2019-08-01: qty 1

## 2019-08-01 MED ORDER — NITROGLYCERIN 0.4 MG SL SUBL
0.4000 mg | SUBLINGUAL_TABLET | SUBLINGUAL | Status: DC | PRN
  Filled 2019-08-01: qty 1

## 2019-08-01 MED ORDER — DIPHENHYDRAMINE HCL 50 MG/ML IJ SOLN
25.0000 mg | Freq: Once | INTRAMUSCULAR | Status: AC
  Administered 2019-08-01: 25 mg via INTRAVENOUS
  Filled 2019-08-01: qty 1

## 2019-08-01 MED ORDER — SODIUM CHLORIDE 0.9 % IV BOLUS
500.0000 mL | Freq: Once | INTRAVENOUS | Status: AC
  Administered 2019-08-01: 500 mL via INTRAVENOUS

## 2019-08-01 MED ORDER — NITROGLYCERIN 0.4 MG SL SUBL: 0.4000 mg | SUBLINGUAL_TABLET | SUBLINGUAL | Status: DC | PRN

## 2019-08-01 MED ORDER — METOCLOPRAMIDE HCL 5 MG/ML IJ SOLN
10.0000 mg | Freq: Once | INTRAMUSCULAR | Status: AC
  Administered 2019-08-01: 10 mg via INTRAVENOUS
  Filled 2019-08-01: qty 2

## 2019-08-01 NOTE — ED Notes (Signed)
Pt does not want nitro at this time.

## 2019-08-01 NOTE — ED Triage Notes (Addendum)
Pt c/o left arm/wrist brusing and pain x 1 day , heart cath x 1 month ago , pt also states she has had cp since her heart cath

## 2019-08-01 NOTE — ED Provider Notes (Signed)
[facility] EMERGENCY DEPARTMENT Provider Note   CSN: [id number] Arrival date & time: 08/01/19  1919     History Chief Complaint  Patient presents with  . Arm Pain  . Chest Pain    Monica Khan is a 73 y.o. female history of breast cancer, LAD lesion status post stent, here presenting with chest pain.  Patient states that she had a cardiac catheterization done at [facility] about a month ago.  She states that it did not go very well and she has persistent chest pain after the procedure.  She states that she has seen her cardiologist multiple times and did not give a clear answer.  She is put on Coumadin and Plavix currently.  She states that she has allergy to aspirin and is not taking it.  Patient states that this afternoon, she was sitting in her chair and has some left-sided chest pressure.  The pain does not radiate anywhere.  She was not given any nitro and she states that she still has some sense of pressure.  She also noticed bruising in the left forearm.  She states that her cath was in the right forearm and the cath site was fine.  She denies any trauma or injury to the arm.  The history is provided by the patient.       Past Medical History:  Diagnosis Date  . Breast cancer ([place])    In remission  . Fluttering heart   . Gastritis   . Migraines   . Peripheral neuropathy   . Shingles    Recurrent    Patient Active Problem List   Diagnosis Date Noted  . Protein-calorie malnutrition, severe ([place]) 09/20/2012  . Hepatocellular injury 09/17/2012  . Transaminitis 09/17/2012  . Nausea and vomiting 09/17/2012  . Shingles 09/17/2012  . History of breast cancer 09/17/2012    Past Surgical History:  Procedure Laterality Date  . ABDOMINAL HYSTERECTOMY    . APPENDECTOMY    . CHOLECYSTECTOMY    . TOTAL MASTECTOMY       OB History   No obstetric history on file.     No family history on file.  Social History   Tobacco Use  . Smoking status: Never Smoker   . Smokeless tobacco: Never Used  Substance Use Topics  . Alcohol use: No    Comment: States she has never been a heavy drinker and last EtOH was 2 years ago when husband died.  . Drug use: No    Home Medications Prior to Admission medications   Medication Sig Start Date End Date Taking? Authorizing Provider  azelastine (ASTELIN) 137 MCG/SPRAY nasal spray Place 1 spray into the nose 2 (two) times daily as needed for rhinitis.    [provider]  doxycycline (VIBRAMYCIN) 100 MG capsule Take 1 capsule (100 mg total) by mouth 2 (two) times daily. 12/22/18   Isla Pence, MD  feeding supplement (RESOURCE BREEZE) LIQD Take 1 Container by mouth 3 (three) times daily between meals. 09/22/12   Rai, Vernelle Emerald, MD  HYDROcodone-acetaminophen (NORCO/VICODIN) 5-325 MG tablet Take 1 tablet by mouth every 4 (four) hours as needed. 12/22/18   Isla Pence, MD  meperidine (DEMEROL) 50 MG tablet Take 50-100 mg by mouth every 4 (four) hours as needed for pain.    [provider]  omeprazole (PRILOSEC) 40 MG capsule Take 40 mg by mouth daily.    [provider]  ondansetron (ZOFRAN ODT) 4 MG disintegrating tablet Take 1 tablet (4  mg total) by mouth every 8 (eight) hours as needed. 12/22/18   Isla Pence, MD  oxyCODONE-acetaminophen (PERCOCET/ROXICET) 5-325 MG tablet Take 1 tablet by mouth every 6 (six) hours as needed for severe pain. 08/21/18   Horton, Barbette Hair, MD  promethazine (PHENERGAN) 12.5 MG tablet Take 1 tablet (12.5 mg total) by mouth every 6 (six) hours as needed for nausea. 09/22/12   Rai, Ripudeep K, MD  temazepam (RESTORIL) 30 MG capsule Take 30 mg by mouth at bedtime as needed for sleep.    [provider]  traZODone (DESYREL) 50 MG tablet Take 50 mg by mouth at bedtime as needed for sleep or depression.    [provider]  valACYclovir (VALTREX) 1000 MG tablet Take 1 tablet (1,000 mg total) by mouth 3 (three) times daily. Till 09/24/12 then take  daily 09/22/12   Rai, Vernelle Emerald, MD  warfarin (COUMADIN) 4 MG tablet Take 4-6 mg by mouth daily with breakfast. Takes 4mg  for 2 days and takes 6mg  on day 3; then repeats the cycle.    [provider]    Allergies    Cephalosporins, Penicillins, Codeine, Compazine [prochlorperazine edisylate], and Morphine and related  Review of Systems   Review of Systems  Cardiovascular: Positive for chest pain.  All other systems reviewed and are negative.   Physical Exam Updated Vital Signs BP (!) 172/76   Pulse 82   Temp 98.4 F (36.9 C) (Oral)   Resp 18   SpO2 100%   Physical Exam Vitals and nursing note reviewed.  Constitutional:      Appearance: She is well-developed.  HENT:     Head: Normocephalic.  Eyes:     Extraocular Movements: Extraocular movements intact.     Pupils: Pupils are equal, round, and reactive to light.  Cardiovascular:     Rate and Rhythm: Normal rate and regular rhythm.     Heart sounds: Normal heart sounds.  Pulmonary:     Effort: Pulmonary effort is normal.     Breath sounds: Normal breath sounds.  Abdominal:     General: Bowel sounds are normal.     Palpations: Abdomen is soft.  Musculoskeletal:        General: Normal range of motion.     Cervical back: Normal range of motion and neck supple.     Comments: Ecchymosis L forearm, 2 + radial pulse   Skin:    General: Skin is warm.     Capillary Refill: Capillary refill takes less than 2 seconds.  Neurological:     General: No focal deficit present.     Mental Status: She is alert and oriented to person, place, and time.  Psychiatric:        Mood and Affect: Mood normal.        Behavior: Behavior normal.     ED Results / Procedures / Treatments   Labs (all labs ordered are listed, but only abnormal results are displayed) Labs Reviewed  COMPREHENSIVE METABOLIC PANEL - Abnormal; Notable for the following components:      Result Value   Glucose, Bld 101 (*)    Calcium 8.7 (*)    All other  components within normal limits  CBC WITH DIFFERENTIAL/PLATELET  PROTIME-INR  TROPONIN I (HIGH SENSITIVITY)    EKG EKG Interpretation  Date/Time:  Wednesday August 01 2019 19:32:44 EDT Ventricular Rate:  76 PR Interval:  136 QRS Duration: 112 QT Interval:  422 QTC Calculation: 474 R Axis:   -40 Text Interpretation:  Normal sinus rhythm Left axis deviation Right bundle branch block Septal infarct , age undetermined Abnormal ECG No significant change since last tracing Confirmed by Wandra Arthurs [phone]) on 08/01/2019 7:54:54 PM   Radiology DG Chest 2 View  Result Date: 08/01/2019 CLINICAL DATA:  Chest pain for 1 month following cardiac catheterization EXAM: CHEST - 2 VIEW COMPARISON:  06/13/2019 FINDINGS: Heart size is normal. Lungs are hyperinflated but clear. No pleural effusion or edema. No airspace opacities. Surgical clips within bilateral axillary regions. IMPRESSION: No acute cardiopulmonary abnormalities. Electronically Signed   By: Kerby Moors M.D.   On: 08/01/2019 20:06    Procedures Procedures (including critical care time)  Medications Ordered in ED Medications  isosorbide mononitrate (IMDUR) 24 hr tablet 60 mg (has no administration in time range)  metoCLOPramide (REGLAN) injection 10 mg (has no administration in time range)  diphenhydrAMINE (BENADRYL) injection 25 mg (has no administration in time range)  sodium chloride 0.9 % bolus 500 mL (has no administration in time range)    ED Course  I have reviewed the triage vital signs and the nursing notes.  Pertinent labs & imaging results that were available during my care of the patient were reviewed by me and considered in my medical decision making (see chart for details).    MDM Rules/Calculators/A&P                          Monica Khan is a 74 y.o. female here presenting with chest pain.  Been going on intermittently for the last month or so since the catheterization. I wonder patient has some underlying CAD  and has some stable angina.  Patient is not on any long-acting nitrates.  Will get CBC, CMP, troponin x2.  Patient states that she does not want to be admitted unless she has active heart attack.  11:01 PM Delta trop negative. INR is 2.4.  I offered nitro to her initially but she refused.  Patient states that she is pain-free currently.  I wonder if she has underlying stable angina.  She states that she does not want to be admitted at this time.  I think she may benefit from Imdur.  I told her to discuss this with her cardiologist.  Final Clinical Impression(s) / ED Diagnoses Final diagnoses:  None    Rx / DC Orders ED Discharge Orders    None       Drenda Freeze, MD 08/01/19 2302

## 2019-08-01 NOTE — Discharge Instructions (Signed)
Continue your current meds   You may benefit from long acting nitro such as imdur. Please discuss with your heart doctor   Return to ER if you have worse chest pain, trouble breathing

## 2019-08-01 NOTE — ED Notes (Signed)
Provider bedside.

## 2020-10-27 IMAGING — DX DG CHEST 2V
2 series · 2 of 2 positions shown · non-contrast
Comparison: 06/13/2019

CLINICAL DATA: Chest pain for 1 month following cardiac
catheterization

EXAM:
CHEST - 2 VIEW

[chest pa]
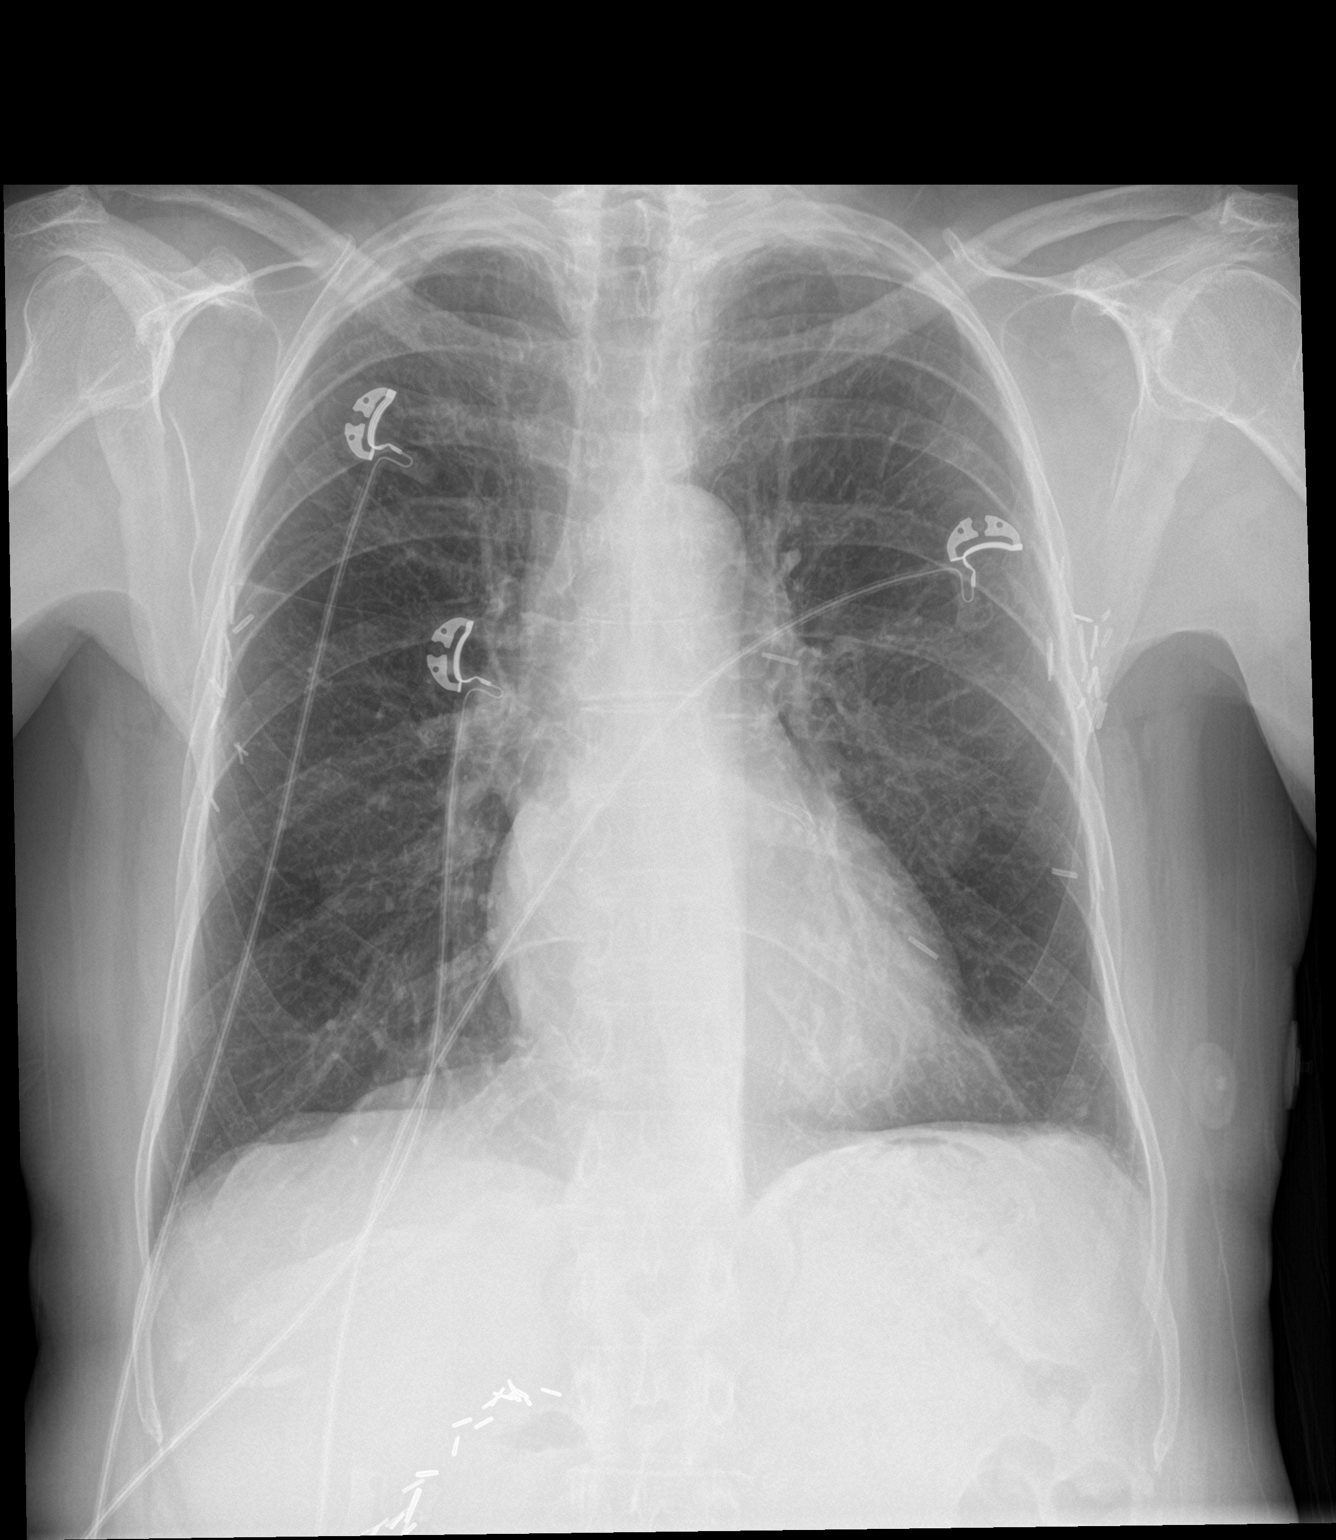

[chest lat]
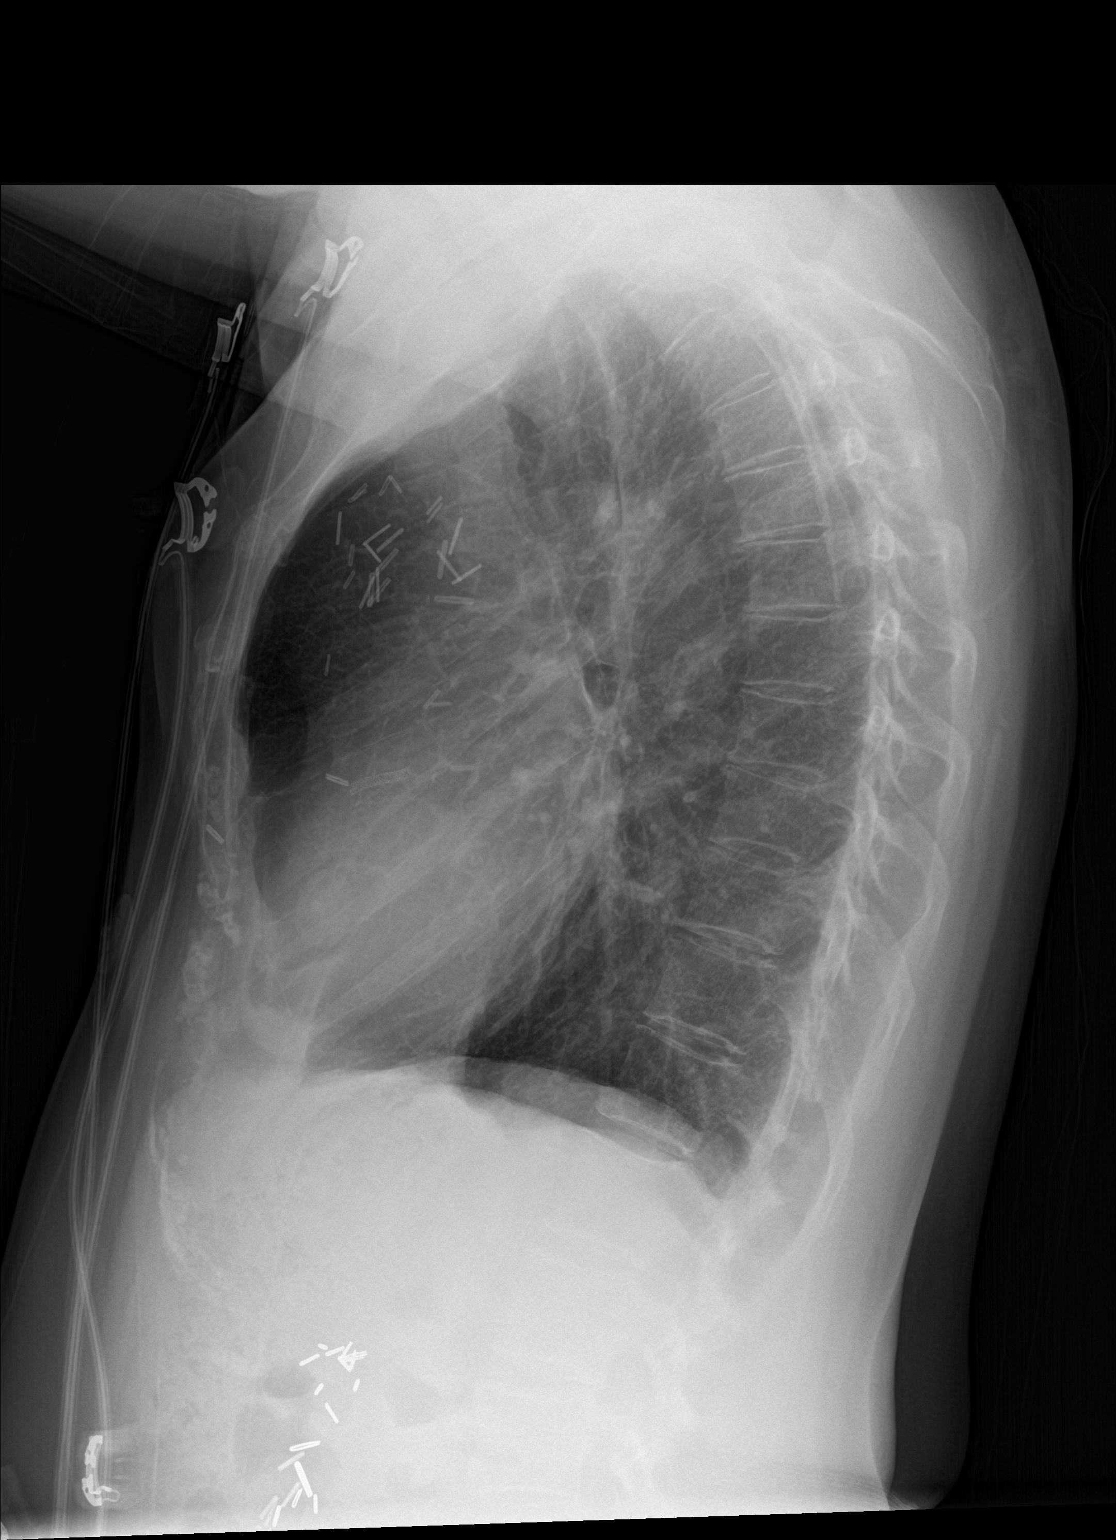

[2 of 2 positions shown; findings below may reference images not displayed]

FINDINGS: Heart size is normal. Lungs are hyperinflated but clear. No pleural
effusion or edema. No airspace opacities. Surgical clips within
bilateral axillary regions.
IMPRESSION: No acute cardiopulmonary abnormalities.

## 2021-08-30 ENCOUNTER — Encounter (HOSPITAL_BASED_OUTPATIENT_CLINIC_OR_DEPARTMENT_OTHER): Payer: Self-pay | Admitting: Emergency Medicine

## 2021-08-30 ENCOUNTER — Emergency Department (HOSPITAL_BASED_OUTPATIENT_CLINIC_OR_DEPARTMENT_OTHER)
Admission: EM | Admit: 2021-08-30 | Discharge: 2021-08-30 | Disposition: A | Discharge status: Home / Self Care | Payer: Medicare Other | Attending: Emergency Medicine | Admitting: Emergency Medicine

## 2021-08-30 ENCOUNTER — Emergency Department (HOSPITAL_BASED_OUTPATIENT_CLINIC_OR_DEPARTMENT_OTHER): Payer: Medicare Other

## 2021-08-30 DIAGNOSIS — R109 Unspecified abdominal pain: Secondary | ICD-10-CM | POA: Insufficient documentation

## 2021-08-30 DIAGNOSIS — R300 Dysuria: Secondary | ICD-10-CM | POA: Diagnosis present

## 2021-08-30 DIAGNOSIS — R519 Headache, unspecified: Secondary | ICD-10-CM | POA: Diagnosis not present

## 2021-08-30 DIAGNOSIS — Z853 Personal history of malignant neoplasm of breast: Secondary | ICD-10-CM | POA: Insufficient documentation

## 2021-08-30 DIAGNOSIS — R10A2 Flank pain, left side: Secondary | ICD-10-CM

## 2021-08-30 DIAGNOSIS — Z87442 Personal history of urinary calculi: Secondary | ICD-10-CM | POA: Insufficient documentation

## 2021-08-30 LAB — URINALYSIS, ROUTINE W REFLEX MICROSCOPIC
Bilirubin Urine: NEGATIVE
Glucose, UA: NEGATIVE mg/dL
Ketones, ur: NEGATIVE mg/dL
Leukocytes,Ua: NEGATIVE
Nitrite: NEGATIVE
Protein, ur: NEGATIVE mg/dL
Specific Gravity, Urine: 1.02 (ref 1.005–1.030)
pH: 6 (ref 5.0–8.0)

## 2021-08-30 LAB — COMPREHENSIVE METABOLIC PANEL
ALT: 18 U/L (ref 0–44)
AST: 25 U/L (ref 15–41)
Albumin: 4.3 g/dL (ref 3.5–5.0)
Alkaline Phosphatase: 45 U/L (ref 38–126)
Anion gap: 8 (ref 5–15)
BUN: 15 mg/dL (ref 8–23)
CO2: 27 mmol/L (ref 22–32)
Calcium: 9.1 mg/dL (ref 8.9–10.3)
Chloride: 104 mmol/L (ref 98–111)
Creatinine, Ser: 0.61 mg/dL (ref 0.44–1.00)
GFR, Estimated: >60 mL/min (ref >60)
Glucose, Bld: 122 mg/dL — ABNORMAL HIGH (ref 70–99)
Potassium: 4.2 mmol/L (ref 3.5–5.1)
Sodium: 139 mmol/L (ref 135–145)
Total Bilirubin: 0.7 mg/dL (ref 0.3–1.2)
Total Protein: 7 g/dL (ref 6.5–8.1)

## 2021-08-30 LAB — CBC WITH DIFFERENTIAL/PLATELET
Abs Immature Granulocytes: 0.01 10*3/uL (ref 0.00–0.07)
Basophils Absolute: 0.1 10*3/uL (ref 0.0–0.1)
Basophils Relative: 1 %
Eosinophils Absolute: 0.2 10*3/uL (ref 0.0–0.5)
Eosinophils Relative: 2 %
HCT: 40.7 % (ref 36.0–46.0)
Hemoglobin: 13.5 g/dL (ref 12.0–15.0)
Immature Granulocytes: 0 %
Lymphocytes Relative: 26 %
Lymphs Abs: 2.1 10*3/uL (ref 0.7–4.0)
MCH: 29.9 pg (ref 26.0–34.0)
MCHC: 33.2 g/dL (ref 30.0–36.0)
MCV: 90 fL (ref 80.0–100.0)
Monocytes Absolute: 0.7 10*3/uL (ref 0.1–1.0)
Monocytes Relative: 8 %
Neutro Abs: 5 10*3/uL (ref 1.7–7.7)
Neutrophils Relative %: 63 %
Platelets: 297 10*3/uL (ref 150–400)
RBC: 4.52 MIL/uL (ref 3.87–5.11)
RDW: 12.2 % (ref 11.5–15.5)
WBC: 7.9 10*3/uL (ref 4.0–10.5)
nRBC: 0 % (ref 0.0–0.2)

## 2021-08-30 LAB — URINALYSIS, MICROSCOPIC (REFLEX)

## 2021-08-30 MED ORDER — ONDANSETRON HCL 4 MG/2ML IJ SOLN
4.0000 mg | Freq: Once | INTRAMUSCULAR | Status: AC
  Administered 2021-08-30: 4 mg via INTRAVENOUS
  Filled 2021-08-30: qty 2

## 2021-08-30 MED ORDER — FENTANYL CITRATE PF 50 MCG/ML IJ SOSY
50.0000 ug | PREFILLED_SYRINGE | Freq: Once | INTRAMUSCULAR | Status: AC
  Administered 2021-08-30: 50 ug via INTRAVENOUS
  Filled 2021-08-30: qty 1

## 2021-08-30 NOTE — ED Notes (Signed)
Signature pad not working. Pt verbalized understanding.

## 2021-08-30 NOTE — ED Provider Notes (Signed)
[facility] EMERGENCY DEPARTMENT Provider Note   CSN: [id number] Arrival date & time: 08/30/21  1504     History  Chief Complaint  Patient presents with   Dysuria    Monica Khan is a 76 y.o. female.  34-year-old female with a past medical history of breast cancer currently in remission presents to the ED with a chief complaint of dysuria x3 weeks.  Reports she was evaluated at the gynecologist office, diagnosed with yeast infection, she then was called about her urine being contaminated with E.coli.  She reports she was placed on Macrobid for a 7-day course.  She finished this medication on Friday, continues to have symptoms.  Evaluated urgent care this morning, worsening pain along the left flank, continues to have frequency, dysuria.  Also endorses chills, these have improved some after the 7-day course of Macrobid.  She also endorses a headache, feeling like she has a migraine left-sided with some blurry vision, which is unusual for her who does not have headaches. In addition, patient reports a nausea and one episode of vomiting last night. Fever at  100.8 last night, but afebrile on arrival.  She is have a history of kidney stones, has not had any recently and none that needed intervention.  The history is provided by the patient and medical records.  Dysuria Pain quality:  Sharp Pain severity:  Moderate Onset quality:  Gradual Duration:  3 weeks Chronicity:  New Associated symptoms: fever, flank pain, nausea and vomiting   Associated symptoms: no abdominal pain        Home Medications Prior to Admission medications   Medication Sig Start Date End Date Taking? Authorizing Provider  azelastine (ASTELIN) 137 MCG/SPRAY nasal spray Place 1 spray into the nose 2 (two) times daily as needed for rhinitis.    [provider]  doxycycline (VIBRAMYCIN) 100 MG capsule Take 1 capsule (100 mg total) by mouth 2 (two) times daily. 12/22/18   Isla Pence, MD  feeding  supplement (RESOURCE BREEZE) LIQD Take 1 Container by mouth 3 (three) times daily between meals. 09/22/12   Rai, Vernelle Emerald, MD  HYDROcodone-acetaminophen (NORCO/VICODIN) 5-325 MG tablet Take 1 tablet by mouth every 4 (four) hours as needed. 12/22/18   Isla Pence, MD  meperidine (DEMEROL) 50 MG tablet Take 50-100 mg by mouth every 4 (four) hours as needed for pain.    [provider]  omeprazole (PRILOSEC) 40 MG capsule Take 40 mg by mouth daily.    [provider]  ondansetron (ZOFRAN ODT) 4 MG disintegrating tablet Take 1 tablet (4 mg total) by mouth every 8 (eight) hours as needed. 12/22/18   Isla Pence, MD  oxyCODONE-acetaminophen (PERCOCET/ROXICET) 5-325 MG tablet Take 1 tablet by mouth every 6 (six) hours as needed for severe pain. 08/21/18   Horton, Barbette Hair, MD  promethazine (PHENERGAN) 12.5 MG tablet Take 1 tablet (12.5 mg total) by mouth every 6 (six) hours as needed for nausea. 09/22/12   Rai, Ripudeep K, MD  temazepam (RESTORIL) 30 MG capsule Take 30 mg by mouth at bedtime as needed for sleep.    [provider]  traZODone (DESYREL) 50 MG tablet Take 50 mg by mouth at bedtime as needed for sleep or depression.    [provider]  valACYclovir (VALTREX) 1000 MG tablet Take 1 tablet (1,000 mg total) by mouth 3 (three) times daily. Till 09/24/12 then take daily 09/22/12   Rai, Vernelle Emerald, MD  warfarin (COUMADIN) 4 MG tablet Take 4-6 mg  by mouth daily with breakfast. Takes '4mg'$  for 2 days and takes '6mg'$  on day 3; then repeats the cycle.    [provider]      Allergies    Cephalosporins, Penicillins, Codeine, Compazine [prochlorperazine edisylate], and Morphine and related    Review of Systems   Review of Systems  Constitutional:  Positive for chills and fever.  HENT:  Negative for sore throat.   Respiratory:  Negative for shortness of breath.   Cardiovascular:  Negative for chest pain.  Gastrointestinal:  Positive for nausea and  vomiting. Negative for abdominal pain.  Genitourinary:  Positive for dysuria and flank pain.  Neurological:  Negative for light-headedness and headaches.  All other systems reviewed and are negative.   Physical Exam Updated Vital Signs BP 93/67   Pulse 76   Temp 98.1 F (36.7 C) (Oral)   Resp 16   Ht 4' 11.75" (1.518 m)   Wt 46.9 kg   SpO2 100%   BMI 20.38 kg/m  Physical Exam Vitals and nursing note reviewed.  Constitutional:      Appearance: Normal appearance.  HENT:     Head: Normocephalic and atraumatic.  Eyes:     Pupils: Pupils are equal, round, and reactive to light.  Cardiovascular:     Rate and Rhythm: Normal rate.  Pulmonary:     Effort: Pulmonary effort is normal.     Breath sounds: No wheezing.  Chest:     Comments: BL mastectomy.  Abdominal:     General: Abdomen is flat.     Tenderness: There is left CVA tenderness.  Musculoskeletal:     Cervical back: Normal range of motion and neck supple.  Skin:    General: Skin is warm and dry.  Neurological:     Mental Status: She is alert.     ED Results / Procedures / Treatments   Labs (all labs ordered are listed, but only abnormal results are displayed) Labs Reviewed  COMPREHENSIVE METABOLIC PANEL - Abnormal; Notable for the following components:      Result Value   Glucose, Bld 122 (*)    All other components within normal limits  URINALYSIS, ROUTINE W REFLEX MICROSCOPIC - Abnormal; Notable for the following components:   Hgb urine dipstick MODERATE (*)    All other components within normal limits  URINALYSIS, MICROSCOPIC (REFLEX) - Abnormal; Notable for the following components:   Bacteria, UA MANY (*)    All other components within normal limits  URINE CULTURE  CBC WITH DIFFERENTIAL/PLATELET    EKG None  Radiology US Renal  Result Date: 08/30/2021 CLINICAL DATA:  Left flank pain recent UTI, history of kidney stones EXAM: RENAL / URINARY TRACT ULTRASOUND COMPLETE COMPARISON:  None Available.  FINDINGS: Right Kidney: Renal measurements: 8.3 x 3.7 x 3.8 cm = volume: 63 mL. Echogenicity within normal limits. No mass. Small nonobstructive calculi. No hydronephrosis. Left Kidney: Renal measurements: 8.3 x 4.8 x 5.5 cm = volume: 116 mL. Echogenicity within normal limits. No mass. Small nonobstructive calculi. No hydronephrosis. Bladder: Underdistended urinary bladder. Other: None. IMPRESSION: 1. No hydronephrosis. 2. Small nonobstructive bilateral renal calculi. 3. Underdistended urinary bladder. Electronically Signed   By: Delanna Ahmadi M.D.   On: 08/30/2021 17:07    Procedures Procedures    Medications Ordered in ED Medications  fentaNYL (SUBLIMAZE) injection 50 mcg (50 mcg Intravenous Given 08/30/21 1607)  ondansetron (ZOFRAN) injection 4 mg (4 mg Intravenous Given 08/30/21 1606)    ED Course/ Medical Decision Making/ A&P  Medical Decision Making Amount and/or Complexity of Data Reviewed Labs: ordered. Radiology: ordered.  Risk Prescription drug management.   This patient presents to the ED for concern of dysuria, this involves a number of treatment options, and is a complaint that carries with it a high risk of complications and morbidity.  The differential diagnosis includes pyelonephritis, infected stone, verus UROsepsis.    Co morbidities: Discussed in HPI   Brief History:  Patient with urinary symptoms for the past 3 weeks, was placed on Macrobid approximate 1 week ago and completed the course.  Has been off the antibiotics for 2 days, feels her symptoms are likely worsening with some severe left flank pain.  Positive nausea and vomiting yesterday and a fever of 100.8.  EMR reviewed including pt PMHx, past surgical history and past visits to ER.   See HPI for more details   Lab Tests:  I ordered and independently interpreted labs.  The pertinent results include:    I personally reviewed all laboratory work and imaging. Metabolic panel  without any acute abnormality specifically kidney function within normal limits and no significant electrolyte abnormalities. CBC without leukocytosis or significant anemia.   Imaging Studies:  Renal Ultrasound showed: 1. No hydronephrosis.  2. Small nonobstructive bilateral renal calculi.  3. Underdistended urinary bladder.      Electronically Signed    Medicines ordered:  I ordered medication including fentanyl, zofran  for symptomatic control Reevaluation of the patient after these medicines showed that the patient improved I have reviewed the patients home medicines and have made adjustments as needed  Social Determinants of Health:  The patient's social determinants of health were a factor in the care of this patient    Problem List / ED Course:  Patient here with 3 weeks of UTI symptoms, she was treated with Macrobid approximately 1 week ago.  Continues to endorse left flank pain, continues to endorse some nausea.  Was evaluated urgent care and was sent here to rule out sepsis.  Patient is afebrile on arrival, normotensive, not tachycardic.  Labs were checked with a normal creatinine level, LFTs are within normal limits.  CBC with no leukocytosis.  Hemoglobin is stable, patient is on Coumadin for prior cardiac stents, chest pain on today's visit.  UA with moderate hemoglobin but no nitrites or leukocytes suggest infection.  Given fentanyl along with Zofran.  Unable to obtain CT renal study and this department due to technical issues, no ultrasound found without any hydronephrosis, small punctuate stones but nothing that would be causing pain at this time.  I did discuss with patient results, I discussed with her answering order to obtain CT imaging at [facility], however patient prefers to follow-up outpatient with urology.  Hemoglobin in her urine has been present since patient finished chemotherapy due to prior history of breast cancer.  Here with no signs of sepsis, in stable  condition. Doubt sepsis, will have her follow up outpatient, strict return precautions discussed.    Dispostion:  After consideration of the diagnostic results and the patients response to treatment, I feel that the patent would benefit from outpatient follow up with urology.     Portions of this note were generated with [employer]. Dictation errors may occur despite best attempts at proofreading.   Final Clinical Impression(s) / ED Diagnoses Final diagnoses:  Dysuria  Left flank pain    Rx / DC Orders ED Discharge Orders     None  Janeece Fitting, PA-C 08/30/21 1820    Lucrezia Starch, MD 08/31/21 2157

## 2021-08-30 NOTE — ED Triage Notes (Addendum)
Pt reports continued dysuria and frequency; recently completed abx for UTI; also sts she has had blurred vision today

## 2021-08-30 NOTE — Discharge Instructions (Addendum)
Please schedule an appointment with urology in order to follow up with for your left flank pain.  If you experience any fever, vomiting or worsening symptoms please return to the Emergency Department at [facility] in order to obtain CT imaging.

## 2021-08-30 NOTE — ED Notes (Signed)
US at bedside

## 2021-08-31 LAB — URINE CULTURE: Culture: NO GROWTH

## 2022-03-23 ENCOUNTER — Emergency Department (HOSPITAL_BASED_OUTPATIENT_CLINIC_OR_DEPARTMENT_OTHER): Payer: Medicare Other

## 2022-03-23 ENCOUNTER — Other Ambulatory Visit: Payer: Self-pay

## 2022-03-23 ENCOUNTER — Emergency Department (HOSPITAL_BASED_OUTPATIENT_CLINIC_OR_DEPARTMENT_OTHER)
Admission: EM | Admit: 2022-03-23 | Discharge: 2022-03-24 | Disposition: A | Discharge status: Home / Self Care | Payer: Medicare Other | Attending: Emergency Medicine | Admitting: Emergency Medicine

## 2022-03-23 DIAGNOSIS — Z23 Encounter for immunization: Secondary | ICD-10-CM | POA: Insufficient documentation

## 2022-03-23 DIAGNOSIS — R220 Localized swelling, mass and lump, head: Secondary | ICD-10-CM | POA: Diagnosis present

## 2022-03-23 DIAGNOSIS — I2510 Atherosclerotic heart disease of native coronary artery without angina pectoris: Secondary | ICD-10-CM | POA: Insufficient documentation

## 2022-03-23 DIAGNOSIS — Z7901 Long term (current) use of anticoagulants: Secondary | ICD-10-CM | POA: Insufficient documentation

## 2022-03-23 DIAGNOSIS — T148XXA Other injury of unspecified body region, initial encounter: Secondary | ICD-10-CM

## 2022-03-23 DIAGNOSIS — S0083XA Contusion of other part of head, initial encounter: Secondary | ICD-10-CM | POA: Diagnosis not present

## 2022-03-23 DIAGNOSIS — M25532 Pain in left wrist: Secondary | ICD-10-CM | POA: Insufficient documentation

## 2022-03-23 DIAGNOSIS — M25531 Pain in right wrist: Secondary | ICD-10-CM | POA: Diagnosis not present

## 2022-03-23 DIAGNOSIS — M25511 Pain in right shoulder: Secondary | ICD-10-CM | POA: Diagnosis not present

## 2022-03-23 DIAGNOSIS — S0990XA Unspecified injury of head, initial encounter: Secondary | ICD-10-CM

## 2022-03-23 DIAGNOSIS — W19XXXA Unspecified fall, initial encounter: Secondary | ICD-10-CM

## 2022-03-23 DIAGNOSIS — W010XXA Fall on same level from slipping, tripping and stumbling without subsequent striking against object, initial encounter: Secondary | ICD-10-CM | POA: Diagnosis not present

## 2022-03-23 LAB — CBC WITH DIFFERENTIAL/PLATELET
Abs Immature Granulocytes: 0.02 10*3/uL (ref 0.00–0.07)
Basophils Absolute: 0.1 10*3/uL (ref 0.0–0.1)
Basophils Relative: 1 %
Eosinophils Absolute: 0.1 10*3/uL (ref 0.0–0.5)
Eosinophils Relative: 1 %
HCT: 39.5 % (ref 36.0–46.0)
Hemoglobin: 13.1 g/dL (ref 12.0–15.0)
Immature Granulocytes: 0 %
Lymphocytes Relative: 23 %
Lymphs Abs: 1.7 10*3/uL (ref 0.7–4.0)
MCH: 29.4 pg (ref 26.0–34.0)
MCHC: 33.2 g/dL (ref 30.0–36.0)
MCV: 88.8 fL (ref 80.0–100.0)
Monocytes Absolute: 0.8 10*3/uL (ref 0.1–1.0)
Monocytes Relative: 10 %
Neutro Abs: 4.9 10*3/uL (ref 1.7–7.7)
Neutrophils Relative %: 65 %
Platelets: 266 10*3/uL (ref 150–400)
RBC: 4.45 MIL/uL (ref 3.87–5.11)
RDW: 11.9 % (ref 11.5–15.5)
WBC: 7.6 10*3/uL (ref 4.0–10.5)
nRBC: 0 % (ref 0.0–0.2)

## 2022-03-23 LAB — BASIC METABOLIC PANEL
Anion gap: 5 (ref 5–15)
BUN: 25 mg/dL — ABNORMAL HIGH (ref 8–23)
CO2: 28 mmol/L (ref 22–32)
Calcium: 8.8 mg/dL — ABNORMAL LOW (ref 8.9–10.3)
Chloride: 103 mmol/L (ref 98–111)
Creatinine, Ser: 0.65 mg/dL (ref 0.44–1.00)
GFR, Estimated: >60 mL/min (ref >60)
Glucose, Bld: 106 mg/dL — ABNORMAL HIGH (ref 70–99)
Potassium: 4.3 mmol/L (ref 3.5–5.1)
Sodium: 136 mmol/L (ref 135–145)

## 2022-03-23 LAB — PROTIME-INR
INR: 2 — ABNORMAL HIGH (ref 0.8–1.2)
Prothrombin Time: 22.1 seconds — ABNORMAL HIGH (ref 11.4–15.2)

## 2022-03-23 MED ORDER — HYDROMORPHONE HCL 1 MG/ML IJ SOLN
0.5000 mg | Freq: Once | INTRAMUSCULAR | Status: AC
  Administered 2022-03-23: 0.5 mg via INTRAVENOUS
  Filled 2022-03-23: qty 1

## 2022-03-23 MED ORDER — TETANUS-DIPHTH-ACELL PERTUSSIS 5-2.5-18.5 LF-MCG/0.5 IM SUSY
0.5000 mL | PREFILLED_SYRINGE | Freq: Once | INTRAMUSCULAR | Status: AC
  Administered 2022-03-23: 0.5 mL via INTRAMUSCULAR
  Filled 2022-03-23: qty 0.5

## 2022-03-23 MED ORDER — ONDANSETRON HCL 4 MG/2ML IJ SOLN
4.0000 mg | Freq: Once | INTRAMUSCULAR | Status: AC
  Administered 2022-03-23: 4 mg via INTRAVENOUS
  Filled 2022-03-23: qty 2

## 2022-03-23 MED ORDER — ONDANSETRON 4 MG PO TBDP
4.0000 mg | ORAL_TABLET | Freq: Once | ORAL | Status: AC | PRN
  Administered 2022-03-23: 4 mg via ORAL
  Filled 2022-03-23: qty 1

## 2022-03-23 NOTE — ED Provider Notes (Signed)
[facility] Provider Note   CSN: [id number] Arrival date & time: 03/23/22  1730     History  Chief Complaint  Patient presents with   Lytle Michaels    Monica Khan is a 77 y.o. female.  Patient reports falling while going to the mailbox fell outside.  Tetanus not up-to-date.  Tripped over a crack between driveway and road.  She fell forward.  Has multiple complaints.  No loss of consciousness.  Has some bruising to the right side of her face abrasions to both hands and bilateral wrist pain.  Bilateral shoulder pain left greater than right and right knee pain.  Patient is on Coumadin and Plavix.  Patient denies any chest pain shortness of breath back pain hip pain.  Past medical history is significant for fluttering heart history of coronary artery disease with stents.  Patient is never used tobacco products.       Home Medications Prior to Admission medications   Medication Sig Start Date End Date Taking? Authorizing Provider  azelastine (ASTELIN) 137 MCG/SPRAY nasal spray Place 1 spray into the nose 2 (two) times daily as needed for rhinitis.    [provider]  doxycycline (VIBRAMYCIN) 100 MG capsule Take 1 capsule (100 mg total) by mouth 2 (two) times daily. 12/22/18   Isla Pence, MD  feeding supplement (RESOURCE BREEZE) LIQD Take 1 Container by mouth 3 (three) times daily between meals. 09/22/12   Rai, Vernelle Emerald, MD  HYDROcodone-acetaminophen (NORCO/VICODIN) 5-325 MG tablet Take 1 tablet by mouth every 4 (four) hours as needed. 12/22/18   Isla Pence, MD  meperidine (DEMEROL) 50 MG tablet Take 50-100 mg by mouth every 4 (four) hours as needed for pain.    [provider]  omeprazole (PRILOSEC) 40 MG capsule Take 40 mg by mouth daily.    [provider]  ondansetron (ZOFRAN ODT) 4 MG disintegrating tablet Take 1 tablet (4 mg total) by mouth every 8 (eight) hours as needed. 12/22/18   Isla Pence, MD   oxyCODONE-acetaminophen (PERCOCET/ROXICET) 5-325 MG tablet Take 1 tablet by mouth every 6 (six) hours as needed for severe pain. 08/21/18   Horton, Barbette Hair, MD  promethazine (PHENERGAN) 12.5 MG tablet Take 1 tablet (12.5 mg total) by mouth every 6 (six) hours as needed for nausea. 09/22/12   Rai, Ripudeep K, MD  temazepam (RESTORIL) 30 MG capsule Take 30 mg by mouth at bedtime as needed for sleep.    [provider]  traZODone (DESYREL) 50 MG tablet Take 50 mg by mouth at bedtime as needed for sleep or depression.    [provider]  valACYclovir (VALTREX) 1000 MG tablet Take 1 tablet (1,000 mg total) by mouth 3 (three) times daily. Till 09/24/12 then take daily 09/22/12   Rai, Vernelle Emerald, MD  warfarin (COUMADIN) 4 MG tablet Take 4-6 mg by mouth daily with breakfast. Takes 9m for 2 days and takes 645mon day 3; then repeats the cycle.    [provider]      Allergies    Cephalosporins, Penicillins, Codeine, Compazine [prochlorperazine edisylate], and Morphine and related    Review of Systems   Review of Systems  Constitutional:  Negative for chills and fever.  HENT:  Positive for facial swelling. Negative for ear pain and sore throat.   Eyes:  Negative for pain and visual disturbance.  Respiratory:  Negative for cough and shortness of breath.   Cardiovascular:  Negative for chest pain and  palpitations.  Gastrointestinal:  Negative for abdominal pain and vomiting.  Genitourinary:  Negative for dysuria and hematuria.  Musculoskeletal:  Positive for joint swelling. Negative for arthralgias and back pain.  Skin:  Positive for wound. Negative for color change and rash.  Neurological:  Negative for seizures and syncope.  Hematological:  Bruises/bleeds easily.  All other systems reviewed and are negative.   Physical Exam Updated Vital Signs BP (!) 131/59   Pulse 70   Temp 97.6 F (36.4 C) (Oral)   Resp 18   Ht 1.499 m (4' 11"$ )   Wt 47.6 kg   SpO2 99%   BMI  21.21 kg/m  Physical Exam Vitals and nursing note reviewed.  Constitutional:      General: She is not in acute distress.    Appearance: Normal appearance. She is well-developed.  HENT:     Head: Normocephalic.     Comments: Some bruising and swelling to right face area.    Mouth/Throat:     Mouth: Mucous membranes are moist.  Eyes:     Extraocular Movements: Extraocular movements intact.     Conjunctiva/sclera: Conjunctivae normal.     Pupils: Pupils are equal, round, and reactive to light.  Neck:     Comments: Mild tenderness palpation to cervical spine more so on the left Cardiovascular:     Rate and Rhythm: Normal rate and regular rhythm.     Heart sounds: No murmur heard. Pulmonary:     Effort: Pulmonary effort is normal. No respiratory distress.     Breath sounds: Normal breath sounds.  Abdominal:     Palpations: Abdomen is soft.     Tenderness: There is no abdominal tenderness.     Comments: Abdomen soft and nontender.  Musculoskeletal:        General: Tenderness and signs of injury present. No swelling.     Cervical back: Neck supple. Tenderness present.     Right lower leg: No edema.     Left lower leg: No edema.     Comments: Right knee with some bruising and swelling.  Patella not dislocated.  No significant effusion.  Bilateral hands with some abrasions to the palm.  No deep wounds.  Left hand with snuffbox tenderness.  Also some in the right wrist area.  But left greater than right.  Radial pulse 2+.  Dorsalis pedis pulse 2+.  No lumbar or thoracic back pain.  Some bilateral shoulder pain without obvious dislocation left greater than right.  No hip tenderness no deformity.  Good range of motion at the hips.  Skin:    General: Skin is warm and dry.     Capillary Refill: Capillary refill takes less than 2 seconds.  Neurological:     General: No focal deficit present.     Mental Status: She is alert and oriented to person, place, and time.     Cranial Nerves: No  cranial nerve deficit.     Sensory: No sensory deficit.  Psychiatric:        Mood and Affect: Mood normal.     ED Results / Procedures / Treatments   Labs (all labs ordered are listed, but only abnormal results are displayed) Labs Reviewed  BASIC METABOLIC PANEL - Abnormal; Notable for the following components:      Result Value   Glucose, Bld 106 (*)    BUN 25 (*)    Calcium 8.8 (*)    All other components within normal limits  PROTIME-INR - Abnormal; Notable  for the following components:   Prothrombin Time 22.1 (*)    INR 2.0 (*)    All other components within normal limits  CBC WITH DIFFERENTIAL/PLATELET    EKG None  Radiology DG Hand Complete Left  Result Date: 03/23/2022 CLINICAL DATA:  Fall.  Pain. EXAM: LEFT HAND - COMPLETE 3+ VIEW COMPARISON:  None Available. FINDINGS: Chronic degenerative changes at the first carpometacarpal joint and of the DIP joints, particularly the index finger. Apparent soft tissue injury with overlying bandage. No evidence of acute fracture or dislocation. IMPRESSION: No acute or traumatic finding. Chronic degenerative changes as above. Soft tissue injury with overlying bandage. Electronically Signed   By: Nelson Chimes M.D.   On: 03/23/2022 20:48   DG Hand Complete Right  Result Date: 03/23/2022 CLINICAL DATA:  Fall.  Pain. EXAM: RIGHT HAND - COMPLETE 3+ VIEW COMPARISON:  None Available. FINDINGS: Chronic degenerative changes of the first carpometacarpal joint. Chronic degenerative changes of the DIP joint of the index finger. No evidence of acute regional fracture or dislocation. IMPRESSION: No acute or traumatic finding. Chronic degenerative changes as above. Electronically Signed   By: Nelson Chimes M.D.   On: 03/23/2022 20:47   DG Wrist Complete Right  Result Date: 03/23/2022 CLINICAL DATA:  [place].  Pain. EXAM: RIGHT WRIST - COMPLETE 3+ VIEW COMPARISON:  None Available. FINDINGS: Chronic degenerative change at the first carpometacarpal joint  and articulation of the multangular bones and scaphoid. No evidence of acute traumatic finding. IMPRESSION: No acute or traumatic finding. Chronic degenerative changes at the first carpometacarpal joint and articulation of the multangular bones and scaphoid. Electronically Signed   By: Nelson Chimes M.D.   On: 03/23/2022 20:46   DG Wrist Complete Left  Result Date: 03/23/2022 CLINICAL DATA:  [place].  Pain. EXAM: LEFT WRIST - COMPLETE 3+ VIEW COMPARISON:  None Available. FINDINGS: No evidence of acute fracture or dislocation. Chronic degenerative changes at the first carpometacarpal joint in the articulation of the multangular bones and scaphoid, including some loose bodies. No acute traumatic finding in that region. Incidental chondrocalcinosis of the wrist joint. IMPRESSION: No acute or traumatic finding. Chronic degenerative changes at the first carpometacarpal joint. Chondrocalcinosis of the wrist joint. Electronically Signed   By: Nelson Chimes M.D.   On: 03/23/2022 20:45   DG Chest 2 View  Result Date: 03/23/2022 CLINICAL DATA:  Status post fall. EXAM: CHEST - 2 VIEW COMPARISON:  None Available. FINDINGS: The heart size and mediastinal contours are within normal limits. Both lungs are clear. Bilateral radiopaque surgical clips are seen overlying the mid and upper left lung. Additional surgical clips are noted within the bilateral chest wall soft tissues and abdomen. Degenerative changes are seen involving both shoulders and throughout the thoracic spine. IMPRESSION: No active cardiopulmonary disease. Electronically Signed   By: Virgina Norfolk M.D.   On: 03/23/2022 20:45   DG Shoulder Left  Result Date: 03/23/2022 CLINICAL DATA:  [place].  Pain. EXAM: LEFT SHOULDER - 2+ VIEW COMPARISON:  None Available. FINDINGS: There is no evidence of fracture or dislocation. There is no evidence of arthropathy or other focal bone abnormality. Soft tissues are unremarkable. IMPRESSION: Negative. Electronically Signed    By: Nelson Chimes M.D.   On: 03/23/2022 20:44   DG Shoulder Right  Result Date: 03/23/2022 CLINICAL DATA:  [place] with shoulder pain EXAM: RIGHT SHOULDER - 2+ VIEW COMPARISON:  None Available. FINDINGS: Mild widening of the [facility] joint with relative elevation of the distal clavicle. This suggests ligamentous  injury at the [facility] joint. No evidence of regional fracture. Humeral head is properly located. IMPRESSION: Mild widening of the [facility] joint with relative elevation of the distal clavicle suggesting ligamentous injury at the [facility] joint. Electronically Signed   By: Nelson Chimes M.D.   On: 03/23/2022 20:44   DG Knee Complete 4 Views Right  Result Date: 03/23/2022 CLINICAL DATA:  Status post fall. EXAM: RIGHT KNEE - COMPLETE 4+ VIEW COMPARISON:  None Available. FINDINGS: No evidence of fracture, dislocation, or joint effusion. No evidence of arthropathy or other focal bone abnormality. Soft tissues are unremarkable. IMPRESSION: Negative. Electronically Signed   By: Virgina Norfolk M.D.   On: 03/23/2022 20:42   CT Cervical Spine Wo Contrast  Result Date: 03/23/2022 CLINICAL DATA:  Fall, with head, facial, and neck trauma. EXAM: CT HEAD WITHOUT CONTRAST CT MAXILLOFACIAL WITHOUT CONTRAST CT CERVICAL SPINE WITHOUT CONTRAST TECHNIQUE: Multidetector CT imaging of the head, cervical spine, and maxillofacial structures were performed using the standard protocol without intravenous contrast. Multiplanar CT image reconstructions of the cervical spine and maxillofacial structures were also generated. RADIATION DOSE REDUCTION: This exam was performed according to the departmental dose-optimization program which includes automated exposure control, adjustment of the mA and/or kV according to patient size and/or use of iterative reconstruction technique. COMPARISON:  04/01/2014 FINDINGS: CT HEAD FINDINGS Brain: No acute intracranial hemorrhage, midline shift or mass effect. No extra-axial fluid collection. Diffuse atrophy is  noted. Periventricular white matter hypodensities are present bilaterally. No hydrocephalus. Vascular: No hyperdense vessel or unexpected calcification. Skull: Normal. Negative for fracture or focal lesion. Other: None. CT MAXILLOFACIAL FINDINGS Osseous: No fracture or mandibular dislocation. No destructive process. Degenerative changes are present at the TMJ on the left. Orbits: Negative. No traumatic or inflammatory finding. Sinuses: Clear. Soft tissues: No large hematoma is seen. CT CERVICAL SPINE FINDINGS Alignment: Mild anterolisthesis at C4-C5. Skull base and vertebrae: No acute fracture. No primary bone lesion or focal pathologic process. Soft tissues and spinal canal: No prevertebral fluid or swelling. No visible canal hematoma. Disc levels: Multilevel intervertebral disc space narrowing, uncovertebral osteophyte formation and facet arthropathy. Upper chest: No acute abnormality. Other: None. IMPRESSION: 1. No acute intracranial process. 2. Atrophy with chronic microvascular ischemic changes. 3. No evidence of facial bone fracture. 4. Degenerative changes in the cervical spine without evidence of acute fracture. Electronically Signed   By: Brett Fairy M.D.   On: 03/23/2022 20:34   CT Head Wo Contrast  Result Date: 03/23/2022 CLINICAL DATA:  Fall, with head, facial, and neck trauma. EXAM: CT HEAD WITHOUT CONTRAST CT MAXILLOFACIAL WITHOUT CONTRAST CT CERVICAL SPINE WITHOUT CONTRAST TECHNIQUE: Multidetector CT imaging of the head, cervical spine, and maxillofacial structures were performed using the standard protocol without intravenous contrast. Multiplanar CT image reconstructions of the cervical spine and maxillofacial structures were also generated. RADIATION DOSE REDUCTION: This exam was performed according to the departmental dose-optimization program which includes automated exposure control, adjustment of the mA and/or kV according to patient size and/or use of iterative reconstruction technique.  COMPARISON:  04/01/2014 FINDINGS: CT HEAD FINDINGS Brain: No acute intracranial hemorrhage, midline shift or mass effect. No extra-axial fluid collection. Diffuse atrophy is noted. Periventricular white matter hypodensities are present bilaterally. No hydrocephalus. Vascular: No hyperdense vessel or unexpected calcification. Skull: Normal. Negative for fracture or focal lesion. Other: None. CT MAXILLOFACIAL FINDINGS Osseous: No fracture or mandibular dislocation. No destructive process. Degenerative changes are present at the TMJ on the left. Orbits: Negative. No traumatic or inflammatory finding. Sinuses:  Clear. Soft tissues: No large hematoma is seen. CT CERVICAL SPINE FINDINGS Alignment: Mild anterolisthesis at C4-C5. Skull base and vertebrae: No acute fracture. No primary bone lesion or focal pathologic process. Soft tissues and spinal canal: No prevertebral fluid or swelling. No visible canal hematoma. Disc levels: Multilevel intervertebral disc space narrowing, uncovertebral osteophyte formation and facet arthropathy. Upper chest: No acute abnormality. Other: None. IMPRESSION: 1. No acute intracranial process. 2. Atrophy with chronic microvascular ischemic changes. 3. No evidence of facial bone fracture. 4. Degenerative changes in the cervical spine without evidence of acute fracture. Electronically Signed   By: Brett Fairy M.D.   On: 03/23/2022 20:34   CT Maxillofacial WO CM  Result Date: 03/23/2022 CLINICAL DATA:  Fall, with head, facial, and neck trauma. EXAM: CT HEAD WITHOUT CONTRAST CT MAXILLOFACIAL WITHOUT CONTRAST CT CERVICAL SPINE WITHOUT CONTRAST TECHNIQUE: Multidetector CT imaging of the head, cervical spine, and maxillofacial structures were performed using the standard protocol without intravenous contrast. Multiplanar CT image reconstructions of the cervical spine and maxillofacial structures were also generated. RADIATION DOSE REDUCTION: This exam was performed according to the departmental  dose-optimization program which includes automated exposure control, adjustment of the mA and/or kV according to patient size and/or use of iterative reconstruction technique. COMPARISON:  04/01/2014 FINDINGS: CT HEAD FINDINGS Brain: No acute intracranial hemorrhage, midline shift or mass effect. No extra-axial fluid collection. Diffuse atrophy is noted. Periventricular white matter hypodensities are present bilaterally. No hydrocephalus. Vascular: No hyperdense vessel or unexpected calcification. Skull: Normal. Negative for fracture or focal lesion. Other: None. CT MAXILLOFACIAL FINDINGS Osseous: No fracture or mandibular dislocation. No destructive process. Degenerative changes are present at the TMJ on the left. Orbits: Negative. No traumatic or inflammatory finding. Sinuses: Clear. Soft tissues: No large hematoma is seen. CT CERVICAL SPINE FINDINGS Alignment: Mild anterolisthesis at C4-C5. Skull base and vertebrae: No acute fracture. No primary bone lesion or focal pathologic process. Soft tissues and spinal canal: No prevertebral fluid or swelling. No visible canal hematoma. Disc levels: Multilevel intervertebral disc space narrowing, uncovertebral osteophyte formation and facet arthropathy. Upper chest: No acute abnormality. Other: None. IMPRESSION: 1. No acute intracranial process. 2. Atrophy with chronic microvascular ischemic changes. 3. No evidence of facial bone fracture. 4. Degenerative changes in the cervical spine without evidence of acute fracture. Electronically Signed   By: Brett Fairy M.D.   On: 03/23/2022 20:34    Procedures Procedures    Medications Ordered in ED Medications  ondansetron (ZOFRAN-ODT) disintegrating tablet 4 mg (4 mg Oral Given 03/23/22 1848)  Tdap (BOOSTRIX) injection 0.5 mL (0.5 mLs Intramuscular Given 03/23/22 1935)  HYDROmorphone (DILAUDID) injection 0.5 mg (0.5 mg Intravenous Given 03/23/22 1934)  ondansetron (ZOFRAN) injection 4 mg (4 mg Intravenous Given 03/23/22  1935)    ED Course/ Medical Decision Making/ A&P                             Medical Decision Making Amount and/or Complexity of Data Reviewed Labs: ordered. Radiology: ordered.  Risk Prescription drug management.   CBC white blood cell count 7.6 hemoglobin 13.1 platelets are 266.  Patient thought she had a lot of bleeding from the wounds on the hand.  But hemoglobin very reassuring.  Basic metabolic panel BUN 25 creatinine 0.65 blood sugar 106 electrolytes normal.  INR normal at 2.0.  GFR is greater than 60.  X-rays of the left shoulder without any bony abnormalities x-ray of the right shoulder raises  concerns for [facility] separation.  X-rays of both hands and both wrists without any bony abnormalities.  The patient does have bilateral snuffbox tenderness in both wrist.  X-ray of the right knee without any bony abnormality CT head neck and face without any acute findings.  Patient's tetanus updated.  Patient had her wounds clean.  Dressed with antibiotic ointment nonadherent dressing.  Patient does have bilateral snuffbox tenderness left greater than right.  I do not feel comfortable with her having splint on both wrists.  So we will just do left.  She understands the significance close follow-up for this and I figured they can sort this out with time prickly with x-ray in about 2 weeks.  Patient given referral to sports medicine upstairs but she thinks she has a known orthopedist and thinks has the information at home.   Patient stable for discharge home.  Recommend just Eksir strength Tylenol at home.   Final Clinical Impression(s) / ED Diagnoses Final diagnoses:  Fall, initial encounter  Contusion of face, initial encounter  Injury of head, initial encounter  Abrasion  Acute pain of right shoulder  Acute pain of both wrists    Rx / DC Orders ED Discharge Orders     None         Fredia Sorrow, MD 03/23/22 2356

## 2022-03-23 NOTE — Discharge Instructions (Signed)
Workup here CT head neck and face without any acute injuries.  X-rays of both shoulders negative but raise concerns on the right shoulder for an AC separation.  X-rays of both hands and wrist without any bony abnormalities.  But you have snuffbox tenderness in both wrist but left greater than right.  Wear the splint.  You can remove the splint to do dressing changes keep today's dressings in place for 2 days.  Then remove fresh antibiotic ointment after washing with soap and water and a nonadherent dressing.  Make an appointment to follow-up with sports medicine upstairs or your orthopedic doctor.  You could have occult wrist bone fractures and close follow-up is important.  Also x-rays of your right knee were negative.  Recommend extra strength Tylenol as needed for pain.

## 2022-03-23 NOTE — ED Triage Notes (Signed)
Pt reports tripping and falling prior to arrival and reports hitting the side of her face. Pt denies LOC. Pt complains of right and left hand and wrist pain. Pt reports left shoulder pain and right knee pain. Pt has bruise to right side face. Pt has abrasions to bilateral palms with bleeding controlled with loose dressing. Pt reports taking coumadin and plavix.

## 2022-03-23 NOTE — ED Notes (Signed)
Wounds cleansed with betadine & saline Dressing applied with Neosporin, non-stick guaze, kling & tape.

## 2022-04-12 ENCOUNTER — Encounter: Payer: Self-pay | Admitting: Family Medicine

## 2022-04-12 ENCOUNTER — Ambulatory Visit (INDEPENDENT_AMBULATORY_CARE_PROVIDER_SITE_OTHER): Payer: Medicare Other | Admitting: Family Medicine

## 2022-04-12 VITALS — BP 110/60 | Ht 59.0 in | Wt 105.0 lb

## 2022-04-12 DIAGNOSIS — M19012 Primary osteoarthritis, left shoulder: Secondary | ICD-10-CM | POA: Diagnosis not present

## 2022-04-12 DIAGNOSIS — S161XXA Strain of muscle, fascia and tendon at neck level, initial encounter: Secondary | ICD-10-CM | POA: Diagnosis not present

## 2022-04-12 DIAGNOSIS — S63501A Unspecified sprain of right wrist, initial encounter: Secondary | ICD-10-CM

## 2022-04-12 DIAGNOSIS — M11261 Other chondrocalcinosis, right knee: Secondary | ICD-10-CM | POA: Diagnosis not present

## 2022-04-12 DIAGNOSIS — M25532 Pain in left wrist: Secondary | ICD-10-CM | POA: Diagnosis not present

## 2022-04-12 DIAGNOSIS — S060X0A Concussion without loss of consciousness, initial encounter: Secondary | ICD-10-CM | POA: Insufficient documentation

## 2022-04-12 DIAGNOSIS — M1812 Unilateral primary osteoarthritis of first carpometacarpal joint, left hand: Secondary | ICD-10-CM | POA: Insufficient documentation

## 2022-04-12 DIAGNOSIS — S52502A Unspecified fracture of the lower end of left radius, initial encounter for closed fracture: Secondary | ICD-10-CM | POA: Insufficient documentation

## 2022-04-12 NOTE — Assessment & Plan Note (Signed)
Acutely occurring after recent fall on 2/13.  Having some tightness in external rotation which is likely irritation of the joint. -Counseled on home exercise therapy and supportive care. -Referral to physical therapy. -Could consider injection or further imaging

## 2022-04-12 NOTE — Assessment & Plan Note (Signed)
Acutely occurring after recent fall on 2/13.  Exam would be more consistent with a fracture given tenderness and limited range of motion and swelling. -Counseled on home exercise therapy and supportive care. -Placed splint today. -Would reimage at follow-up

## 2022-04-12 NOTE — Assessment & Plan Note (Signed)
Acutely occurring after recent fall on 2/13.  No significant swelling on exam today.  She does have good motion and strength. -Counseled on home exercise therapy and supportive care. -Could consider injection or further imaging

## 2022-04-12 NOTE — Patient Instructions (Signed)
Nice to meet you Please try ice on the shoulder and knee  Please try splint  Please try adding a half of a pill of the trazodone for sleep We have made a referral to physical therapy   Please send me a message in [place] with any questions or updates.  Please see me back in 1 week.   --Dr. Raeford Razor

## 2022-04-12 NOTE — Progress Notes (Signed)
Monica Khan - 77 y.o. female MRN [id number]  Date of birth: 1945-02-14  SUBJECTIVE:  Including CC & ROS.  No chief complaint on file.   Monica Khan is a 77 y.o. female that is presenting with headache, dizziness and trouble sleeping with bilateral wrist pain right knee pain and left shoulder pain.  She had a recent fall where she landed onto her hands and chest.  Her left wrist is more painful at this moment.  She reports limited range of motion in the neck.  Review of the emergency department note from 2/13 shows she was counseled on supportive care Independent review of the left wrist x-ray from 2/13 shows chronic of the wrist. Independent review of the right wrist x-ray from 2/13 shows chronic changes in the wrist Independent review of the right hand x-ray from 2/13 shows no acute changes. Independent review of the left hand x-ray from 2/13 shows no acute changes. Independent review of the left shoulder x-ray from 2/13 shows no acute changes. Independent review of the CT cervical spine from 2/13 shows degenerative changes at the C4-5. Independent review of the right knee x-ray from 2/13 shows chondrocalcinosis.  Review of Systems See HPI   HISTORY: Past Medical, Surgical, Social, and Family History Reviewed & Updated per EMR.   Pertinent Historical Findings include:  Past Medical History:  Diagnosis Date   Breast cancer ([place])    In remission   Fluttering heart    Gastritis    Migraines    Peripheral neuropathy    Shingles    Recurrent    Past Surgical History:  Procedure Laterality Date   ABDOMINAL HYSTERECTOMY     APPENDECTOMY     CHOLECYSTECTOMY     TOTAL MASTECTOMY       PHYSICAL EXAM:  VS: BP 110/60 (BP Location: Right Arm, Patient Position: Sitting)   Ht '4\' 11"'$  (1.499 m)   Wt 105 lb (47.6 kg)   BMI 21.21 kg/m  Physical Exam Gen: NAD, alert, cooperative with exam, well-appearing MSK:  Neurovascularly intact    1. Wrist/hand  2. left 3. Volar short  arm 4. Ortho-glass 5. Applied by Dr. Raeford Razor     ASSESSMENT & PLAN:   Concussion with no loss of consciousness Acutely occurring after recent fall.  She is having dizziness, headaches and trouble sleeping.  She hit her head with a normal CT scan on 2/13. -Counseled on home exercise therapy and supportive care. -Referral to physical therapy. -Counseled on taking trazodone for sleep.   Cervical strain Acutely occurring after recent fall on 2/13.  She does have degenerative changes in the neck appreciated. -Counseled on home exercise therapy and supportive care. -Referral to physical therapy. -Could consider trigger point injections.   Chondrocalcinosis of right knee Acutely occurring after recent fall.  No significant degenerative changes but x-ray does show chondrocalcinosis. -Counseled on home exercise therapy and supportive care. -Referral to physical therapy. -Could consider injection or further imaging.  Primary osteoarthritis of left shoulder Acutely occurring after recent fall on 2/13.  Having some tightness in external rotation which is likely irritation of the joint. -Counseled on home exercise therapy and supportive care. -Referral to physical therapy. -Could consider injection or further imaging  Wrist sprain, right, initial encounter Acutely occurring after recent fall on 2/13.  No significant swelling on exam today.  She does have good motion and strength. -Counseled on home exercise therapy and supportive care. -Could consider injection or further imaging  Arthralgia of left wrist Acutely occurring  after recent fall on 2/13.  Exam would be more consistent with a fracture given tenderness and limited range of motion and swelling. -Counseled on home exercise therapy and supportive care. -Placed splint today. -Would reimage at follow-up

## 2022-04-12 NOTE — Assessment & Plan Note (Signed)
Acutely occurring after recent fall on 2/13.  She does have degenerative changes in the neck appreciated. -Counseled on home exercise therapy and supportive care. -Referral to physical therapy. -Could consider trigger point injections.

## 2022-04-12 NOTE — Assessment & Plan Note (Signed)
Acutely occurring after recent fall.  No significant degenerative changes but x-ray does show chondrocalcinosis. -Counseled on home exercise therapy and supportive care. -Referral to physical therapy. -Could consider injection or further imaging.

## 2022-04-12 NOTE — Assessment & Plan Note (Signed)
Acutely occurring after recent fall.  She is having dizziness, headaches and trouble sleeping.  She hit her head with a normal CT scan on 2/13. -Counseled on home exercise therapy and supportive care. -Referral to physical therapy. -Counseled on taking trazodone for sleep.

## 2022-04-19 ENCOUNTER — Ambulatory Visit: Payer: Self-pay

## 2022-04-19 ENCOUNTER — Ambulatory Visit (INDEPENDENT_AMBULATORY_CARE_PROVIDER_SITE_OTHER): Payer: Medicare Other | Admitting: Family Medicine

## 2022-04-19 VITALS — BP 116/50 | Ht 59.75 in | Wt 103.0 lb

## 2022-04-19 DIAGNOSIS — S161XXD Strain of muscle, fascia and tendon at neck level, subsequent encounter: Secondary | ICD-10-CM

## 2022-04-19 DIAGNOSIS — S52592D Other fractures of lower end of left radius, subsequent encounter for closed fracture with routine healing: Secondary | ICD-10-CM | POA: Diagnosis present

## 2022-04-19 DIAGNOSIS — M19012 Primary osteoarthritis, left shoulder: Secondary | ICD-10-CM | POA: Diagnosis not present

## 2022-04-19 DIAGNOSIS — M25532 Pain in left wrist: Secondary | ICD-10-CM

## 2022-04-19 NOTE — Assessment & Plan Note (Addendum)
Initial injury occurred on 2/13.  Appears to have an intra-articular distal radius fracture from her impaction. -Counseled on home exercise therapy and supportive care. -Thumb spica brace. -Would reimage at follow-up. -Could consider bone density scan

## 2022-04-19 NOTE — Patient Instructions (Signed)
Good to see you Please use the sling as needed  Please try the wrist brace as much as possible  We'll continue with physical therapy  Please use heat on the neck  Please use ice on the shoulder and wrist   Please send me a message in MyChart with any questions or updates.  Please see me back in 2 weeks.   --Dr. Raeford Razor

## 2022-04-19 NOTE — Progress Notes (Signed)
  Monica Khan - 77 y.o. female MRN [id number]  Date of birth: 12/17/1945  SUBJECTIVE:  Including CC & ROS.  No chief complaint on file.   Monica Khan is a 77 y.o. female that is  following up for her left wrist pain and left shoulder pain.  Having most of her pain at her left shoulder and left wrist.  Still having headaches that are worse at the end of the day.    Review of Systems See HPI   HISTORY: Past Medical, Surgical, Social, and Family History Reviewed & Updated per EMR.   Pertinent Historical Findings include:  Past Medical History:  Diagnosis Date   Breast cancer ([place])    In remission   Fluttering heart    Gastritis    Migraines    Peripheral neuropathy    Shingles    Recurrent    Past Surgical History:  Procedure Laterality Date   ABDOMINAL HYSTERECTOMY     APPENDECTOMY     CHOLECYSTECTOMY     TOTAL MASTECTOMY       PHYSICAL EXAM:  VS: BP (!) 116/50   Ht 4' 11.75" (1.518 m)   Wt 103 lb (46.7 kg)   BMI 20.28 kg/m  Physical Exam Gen: NAD, alert, cooperative with exam, well-appearing MSK:  Neurovascularly intact    Limited ultrasound: Left wrist pain/left shoulder pain:  Left wrist: Effusion noted within the carpal joints. There is an intense area of hyperemia at the distal radius within the joint.  There is no changes of the distal third of the radius. Degenerative changes of the scaphoid with no hyperemia. Degenerative changes of the [facility] joint.  Left shoulder: Effusion noted within the biceps tendon sheath. Chronic changes of the subscapularis. AC joint with degenerative changes. Chronic tendinopathy of the supraspinatus  Summary: Findings consistent with intra-articular distal radius fracture and effusion of the glenohumeral joint  Ultrasound and interpretation by Clearance Coots, MD    ASSESSMENT & PLAN:   Distal radius fracture, left Initial injury occurred on 2/13.  Appears to have an intra-articular distal radius fracture from her  impaction. -Counseled on home exercise therapy and supportive care. -Thumb spica brace. -Would reimage at follow-up. -Could consider bone density scan  Primary osteoarthritis of left shoulder Acutely occurring.  Occurring after her initial fall 2/13.  Does have areas of fluid around the joint and degenerative changes of the rotator cuff. -Counseled on home exercise therapy and supportive care. -Provided sling and counseled on its use. -Could consider injection.  Cervical strain Acutely occurring after fall on 2/13.  Has known degenerative changes in the cervical spine and trigger point injections appreciated on exam. -Counseled on home exercise therapy and supportive care. -Could consider trigger point injections.

## 2022-04-19 NOTE — Assessment & Plan Note (Signed)
Acutely occurring after fall on 2/13.  Has known degenerative changes in the cervical spine and trigger point injections appreciated on exam. -Counseled on home exercise therapy and supportive care. -Could consider trigger point injections.

## 2022-04-19 NOTE — Assessment & Plan Note (Signed)
Acutely occurring.  Occurring after her initial fall 2/13.  Does have areas of fluid around the joint and degenerative changes of the rotator cuff. -Counseled on home exercise therapy and supportive care. -Provided sling and counseled on its use. -Could consider injection.

## 2022-04-21 ENCOUNTER — Ambulatory Visit: Payer: BLUE CROSS/BLUE SHIELD | Admitting: Physical Therapy

## 2022-04-27 ENCOUNTER — Ambulatory Visit: Payer: Medicare Other | Attending: Family Medicine | Admitting: Physical Therapy

## 2022-04-27 ENCOUNTER — Ambulatory Visit: Payer: BLUE CROSS/BLUE SHIELD

## 2022-04-27 DIAGNOSIS — M25512 Pain in left shoulder: Secondary | ICD-10-CM | POA: Insufficient documentation

## 2022-04-27 DIAGNOSIS — S63501A Unspecified sprain of right wrist, initial encounter: Secondary | ICD-10-CM | POA: Diagnosis not present

## 2022-04-27 DIAGNOSIS — M11261 Other chondrocalcinosis, right knee: Secondary | ICD-10-CM | POA: Insufficient documentation

## 2022-04-27 DIAGNOSIS — M25531 Pain in right wrist: Secondary | ICD-10-CM | POA: Insufficient documentation

## 2022-04-27 DIAGNOSIS — M6281 Muscle weakness (generalized): Secondary | ICD-10-CM | POA: Diagnosis present

## 2022-04-27 DIAGNOSIS — S060X0A Concussion without loss of consciousness, initial encounter: Secondary | ICD-10-CM | POA: Insufficient documentation

## 2022-04-27 DIAGNOSIS — S161XXA Strain of muscle, fascia and tendon at neck level, initial encounter: Secondary | ICD-10-CM | POA: Diagnosis not present

## 2022-04-27 DIAGNOSIS — M25511 Pain in right shoulder: Secondary | ICD-10-CM | POA: Insufficient documentation

## 2022-04-27 DIAGNOSIS — M542 Cervicalgia: Secondary | ICD-10-CM | POA: Insufficient documentation

## 2022-04-27 DIAGNOSIS — M25532 Pain in left wrist: Secondary | ICD-10-CM | POA: Insufficient documentation

## 2022-04-27 DIAGNOSIS — M62830 Muscle spasm of back: Secondary | ICD-10-CM | POA: Diagnosis present

## 2022-04-27 DIAGNOSIS — M19012 Primary osteoarthritis, left shoulder: Secondary | ICD-10-CM | POA: Diagnosis not present

## 2022-04-27 NOTE — Therapy (Signed)
OUTPATIENT PHYSICAL THERAPY CERVICAL EVALUATION   Patient Name: Monica Khan MRN: [id number] DOB:1945-08-11, 77 y.o., female Today's Date: 04/27/2022  END OF SESSION:  PT End of Session - 04/27/22 1827     Visit Number 1    Number of Visits 16    Date for PT Re-Evaluation 06/22/22    Authorization Type Medicare + BCBS    Progress Note Due on Visit 10    PT Start Time [id number]    PT Stop Time 1448    PT Time Calculation (min) 43 min             Past Medical History:  Diagnosis Date   Breast cancer ([place])    In remission   Fluttering heart    Gastritis    Migraines    Peripheral neuropathy    Shingles    Recurrent   Past Surgical History:  Procedure Laterality Date   ABDOMINAL HYSTERECTOMY     APPENDECTOMY     CHOLECYSTECTOMY     TOTAL MASTECTOMY     Patient Active Problem List   Diagnosis Date Noted   Wrist sprain, right, initial encounter 04/12/2022   Chondrocalcinosis of right knee 04/12/2022   Distal radius fracture, left 04/12/2022   Cervical strain 04/12/2022   Concussion with no loss of consciousness 04/12/2022   Primary osteoarthritis of left shoulder 04/12/2022   Protein-calorie malnutrition, severe ([place]) 09/20/2012   Hepatocellular injury 09/17/2012   Transaminitis 09/17/2012   Nausea and vomiting 09/17/2012   Shingles 09/17/2012   History of breast cancer 09/17/2012    PCP: Cathlean Sauer, MD  REFERRING PROVIDER: Rosemarie Ax, MD  REFERRING DIAG: [phone] (ICD-10-CM) - Wrist sprain, right, initial encounter M11.261 (ICD-10-CM) - Chondrocalcinosis of right knee M25.532 (ICD-10-CM) - Arthralgia of left wrist S16.1XXA (ICD-10-CM) - Strain of neck muscle, initial encounter S06.0X0A (ICD-10-CM) - Concussion without loss of consciousness, initial encounter M19.012 (ICD-10-CM) - Primary osteoarthritis of left shoulder  THERAPY DIAG:  Cervicalgia  Acute pain of right shoulder  Acute pain of left shoulder  Pain in right wrist  Pain in left  wrist  Muscle spasm of back  Muscle weakness (generalized)  Rationale for Evaluation and Treatment: Rehabilitation  ONSET DATE: 03/23/2022  SUBJECTIVE:                                                                                                                                                                                                         SUBJECTIVE STATEMENT: Monica Khan had a bad fall "it was a doozy" she fell forward and hit her  head, had a concussion.  She laid out on the ground for about 20-30 min before neighbor heard her calling.  She has developed a stabbing pain in her back both sides just below shoulder blades.  She also hurt her neck, she has fluid in her L shoulder pressing on nerve endings , has a broken bone, has short term memory loss, had some from chemo, she also has a broken bone in L wrist, hairline fractures in L wrist, maybe fell a little harder on the left, but R shoulder also hurts, sprained her R wrist, but her knee is doing better not hurting as much.  They will hurt but nothing like upper body.  But my ankles, toes, and feet hurt but not as much as shoulders and neck.  Doctor told me it was like getting hit by linebacker.  I have blinding headaches that are worse in afternoon and at night.  I have a wonderful home with long driveway, went to mailbox, was reading mail when walking and tripped over a crack.  That's how I fell.  Concussion left her dizzy and blurred vision for a week and confused, but better now.   NEXT MD VISIT: 05/03/2023 with Dr. Raeford Razor  PERTINENT HISTORY:  History of breast cancer, short term memory loss, peripheral neuropathy, stent in heart, Afib, long term anticoagulation therapy,  history of CVA, cervical spondylosis, migraines  PAIN:  Are you having pain? Yes: NPRS scale: 8/10 Pain location: neck, goes behind ear, numbness in face and forehead on Left side Pain description: sharp, shoot, burning, radiates Aggravating factors:  moving Relieving factors: heat  Are you having pain? Yes: NPRS scale: 7-8/10 Pain location: L shoulder  Pain description: throbbing, aching Aggravating factors: increases as day progresses Relieving factors: heat, propping it up on pillows, wearing sling  Are you having pain? Yes: NPRS scale: 9/10 Pain location: L wrist, R wrist  3/10 Pain description: throbbing, sharp, shooting, burning Aggravating factors: using thumb Relieving factors: splints  Are you having pain? Yes: NPRS scale: 5/10 Pain location: upper back  L>R Pain description: aching, stabbing in evening Aggravating factors: increases in evening Relieving factors: heat  PRECAUTIONS: Fall  WEIGHT BEARING RESTRICTIONS: Yes no lifting or pushing with LUE  FALLS:  Has patient fallen in last 6 months? Yes. Number of falls 1  LIVING ENVIRONMENT: Lives with: lives alone Lives in: House/apartment Stairs:  lives downstairs, had full bedroom and bathroom, avoids going upstairs now. Has 1 step up from garage, no handrail, 3 steps at front door, has handrail.   Has following equipment at home: Single point cane, Walker - 2 wheeled, Environmental consultant - 4 wheeled, Manufacturing engineer  OCCUPATION: retired   PLOF: Independent  PATIENT GOALS: 1) get pain down better then 2) be more mobile, be able to cut food up again.   NEXT MD VISIT: 05/03/2023  OBJECTIVE:   DIAGNOSTIC FINDINGS:  03/23/22 R shoulder FINDINGS: Mild widening of the [facility] joint with relative elevation of the distal clavicle. This suggests ligamentous injury at the [facility] joint. No evidence of regional fracture. Humeral head is properly located. 03/23/22 L shoulder FINDINGS: There is no evidence of fracture or dislocation. There is no evidence of arthropathy or other focal bone abnormality. Soft tissues are unremarkable. 03/23/22 CT Cervical Spine IMPRESSION: 1. No acute intracranial process. 2. Atrophy with chronic microvascular ischemic changes. 3. No evidence of facial bone  fracture. 4. Degenerative changes in the cervical spine without evidence of acute fracture. 03/23/22 DG Wrist R  IMPRESSION: No acute or traumatic finding. Chronic degenerative changes at the first carpometacarpal joint and articulation of the multangular bones and scaphoid. 03/23/22 DG Wrist Left IMPRESSION: No acute or traumatic finding. Chronic degenerative changes at the first carpometacarpal joint. Chondrocalcinosis of the wrist joint.  04/19/22 Limited ultrasound: Left wrist pain/left shoulder pain:   Left wrist: Effusion noted within the carpal joints. There is an intense area of hyperemia at the distal radius within the joint.  There is no changes of the distal third of the radius. Degenerative changes of the scaphoid with no hyperemia. Degenerative changes of the [facility] joint.   Left shoulder: Effusion noted within the biceps tendon sheath. Chronic changes of the subscapularis. AC joint with degenerative changes. Chronic tendinopathy of the supraspinatus   Summary: Findings consistent with intra-articular distal radius fracture and effusion of the glenohumeral joint   Ultrasound and interpretation by Clearance Coots, MD  PATIENT SURVEYS:  NDI 39/50= 78% impairment Quick Dash 95.5% impairment  COGNITION: Overall cognitive status: History of cognitive impairments - at baseline and reports short term memory loss, increased confusion and memory difficulties following recent concussion  SENSATION: Bil neuropathy in feet from chemotherapy  POSTURE: rounded shoulders and forward head  PALPATION:    CERVICAL ROM:   Active ROM AROM (deg) eval  Flexion 15p!  Extension 10p!  Right lateral flexion   Left lateral flexion   Right rotation 20p!  Left rotation 25p!   (Blank rows = not tested)  UPPER EXTREMITY ROM:  Active ROM Right eval Left eval  Shoulder flexion 80p! 60p!  Shoulder extension    Shoulder abduction 80p! 40p!  Shoulder adduction    Shoulder extension     Shoulder internal rotation Functional to R PSIS Unable to reach behind  Shoulder external rotation Functional to R ear/forehead Unable to lift arm   Elbow flexion    Elbow extension    Wrist flexion    Wrist extension    Wrist ulnar deviation    Wrist radial deviation    Wrist pronation    Wrist supination     (Blank rows = not tested)  UPPER EXTREMITY MMT:  MMT Right eval Left eval  Shoulder flexion    Shoulder extension    Shoulder abduction    Shoulder adduction    Shoulder extension    Shoulder internal rotation    Shoulder external rotation    Middle trapezius    Lower trapezius    Elbow flexion    Elbow extension    Wrist flexion    Wrist extension    Wrist ulnar deviation    Wrist radial deviation    Wrist pronation    Wrist supination    Grip strength     (Blank rows = not tested)   FUNCTIONAL TESTS:  5 times sit to stand: 30 seconds without UE assist  TODAY'S TREATMENT:  DATE:   04/27/22 - see patient education.    PATIENT EDUCATION:  Education details: plan of care Person educated: Patient Education method: Explanation Education comprehension: verbalized understanding  HOME EXERCISE PROGRAM: TBD  ASSESSMENT:  CLINICAL IMPRESSION: Monica Khan is a right hand dominant 77 y.o. female who was seen today for physical therapy evaluation and treatment for multiple injuries including bil shoulder, wrist, neck pain and concussion following a bad fall.  She fell more on her L side and reports more pain/limitations in LUE and left side of neck compared to Right.  She entered today with splints on both wrists, she also reports having sling for L shoulder, noted obvious discomfort, given pillow to support L arm while interviewing.  She had significant difficulty staying focused with answers, and reports more difficulty with memory and  confusion following concussion, but overall improvement in dizziness and blurred vision.  Her main concerns today are her neck and shoulders/wrists, as her knee/LE pain has improved and not bothering her as much as UE pain.  Because of the number of complaints was not able to assess concussion today or assess wrists.  Did discuss briefly considering an OT referral as well due to the number of injuries addressing.  Monica Khan demonstrates impaired posture, decreased cervical ROM, decreased shoulder ROM and pain bil, and impaired UE use and would benefit from skilled physical therapy to decrease pain and improve ability to perform ADLs and safety at home.    OBJECTIVE IMPAIRMENTS: decreased activity tolerance, decreased balance, decreased endurance, decreased mobility, difficulty walking, decreased ROM, decreased strength, decreased safety awareness, dizziness, increased fascial restrictions, increased muscle spasms, impaired sensation, impaired UE functional use, postural dysfunction, and pain.   ACTIVITY LIMITATIONS: carrying, lifting, bending, sitting, standing, squatting, sleeping, stairs, transfers, bed mobility, bathing, dressing, self feeding, reach over head, hygiene/grooming, and locomotion level  PARTICIPATION LIMITATIONS: meal prep, cleaning, laundry, medication management, driving, shopping, and community activity  PERSONAL FACTORS: Age, Time since onset of injury/illness/exacerbation, Transportation, and 3+ comorbidities: hx of migraines, history of cerebral infarction, history of DVT, history of paroxysmal AF on chronic anticoagulation with warfarin (also taking Plavix), CAD (LAD stent 2021) history ANA positive, adjustment insomnia, chronic pain, history of malignant neoplasm left breast, zoster, B12 deficiency, vitamin D deficiency, osteoporosis  are also affecting patient's functional outcome.   REHAB POTENTIAL: Good  CLINICAL DECISION MAKING: Evolving/moderate complexity  EVALUATION  COMPLEXITY: Moderate   GOALS: Goals reviewed with patient? Yes  SHORT TERM GOALS: Target date: 05/18/2022   Patient will be independent with initial HEP.  Baseline:  Goal status: INITIAL   Patient will have balance evaluated with appropriate goal set if deficits.  Baseline:  Goal status: INITIAL   LONG TERM GOALS: Target date: 06/22/2022   Patient will be independent with advanced/ongoing HEP to improve outcomes and carryover.  Baseline:  Goal status: INITIAL  2.  Patient will report 75% improvement in neck pain to improve QOL.  Baseline:  Goal status: INITIAL  3.  Patient will demonstrate 15 deg improvement in cervical ROM without pain for safety with walking.    Baseline: see objective Goal status: INITIAL  4.  Patient will report 15% improvement on NDI to demonstrate improved functional ability.  Baseline: 78% impairment Goal status: INITIAL  5. Patient will report 75% improvement in bil shoulder pain to improve QOL.  Baseline: severe Goal status: INITIAL  6.  Patient to improve bil shoulder AROM to [facility] without pain provocation to allow for increased ease of ADLs.  Baseline: see objective Goal status: INITIAL  7.  Patient will report <50% impairment on QuickDash to demonstrate improved functional ability.  Baseline: 95.5% impairment Goal status: INITIAL    PLAN:  PT FREQUENCY: 2x/week  PT DURATION: 8 weeks  PLANNED INTERVENTIONS: Therapeutic exercises, Therapeutic activity, Neuromuscular re-education, Balance training, Gait training, Patient/Family education, Self Care, Joint mobilization, Stair training, Vestibular training, Visual/preceptual remediation/compensation, Dry Needling, Electrical stimulation, Spinal mobilization, Cryotherapy, Moist heat, Taping, Ultrasound, Ionotophoresis 4mg /ml Dexamethasone, Manual therapy, and Re-evaluation  PLAN FOR NEXT SESSION: evaluate balance, wrist ROM, start with gentle postural exercises, isometrics as tolerated.   If  continues to complain about dizziness after neck pain improves assess concussion.    Rennie Natter, PT, DPT  04/27/2022, 7:05 PM

## 2022-04-29 ENCOUNTER — Ambulatory Visit: Payer: Medicare Other

## 2022-04-29 ENCOUNTER — Other Ambulatory Visit: Payer: Self-pay

## 2022-04-29 DIAGNOSIS — M25511 Pain in right shoulder: Secondary | ICD-10-CM

## 2022-04-29 DIAGNOSIS — M25532 Pain in left wrist: Secondary | ICD-10-CM

## 2022-04-29 DIAGNOSIS — M542 Cervicalgia: Secondary | ICD-10-CM | POA: Diagnosis not present

## 2022-04-29 DIAGNOSIS — M62830 Muscle spasm of back: Secondary | ICD-10-CM

## 2022-04-29 DIAGNOSIS — M25512 Pain in left shoulder: Secondary | ICD-10-CM

## 2022-04-29 DIAGNOSIS — M6281 Muscle weakness (generalized): Secondary | ICD-10-CM

## 2022-04-29 DIAGNOSIS — M25531 Pain in right wrist: Secondary | ICD-10-CM

## 2022-04-29 NOTE — Therapy (Signed)
- OUTPATIENT PHYSICAL THERAPY CERVICAL EVALUATION   Patient Name: Mandilyn Villafana MRN: [id number] DOB:12-03-1945, 77 y.o., female Today's Date: 04/29/2022  END OF SESSION:  PT End of Session - 04/29/22 1101     Visit Number 2    Date for PT Re-Evaluation 06/22/22    Authorization Type Medicare + BCBS    Progress Note Due on Visit 10    PT Start Time 1101    PT Stop Time 1145    PT Time Calculation (min) 44 min    Activity Tolerance Patient tolerated treatment well    Behavior During Therapy WFL for tasks assessed/performed              Past Medical History:  Diagnosis Date   Breast cancer ([place])    In remission   Fluttering heart    Gastritis    Migraines    Peripheral neuropathy    Shingles    Recurrent   Past Surgical History:  Procedure Laterality Date   ABDOMINAL HYSTERECTOMY     APPENDECTOMY     CHOLECYSTECTOMY     TOTAL MASTECTOMY     Patient Active Problem List   Diagnosis Date Noted   Wrist sprain, right, initial encounter 04/12/2022   Chondrocalcinosis of right knee 04/12/2022   Distal radius fracture, left 04/12/2022   Cervical strain 04/12/2022   Concussion with no loss of consciousness 04/12/2022   Primary osteoarthritis of left shoulder 04/12/2022   Protein-calorie malnutrition, severe ([place]) 09/20/2012   Hepatocellular injury 09/17/2012   Transaminitis 09/17/2012   Nausea and vomiting 09/17/2012   Shingles 09/17/2012   History of breast cancer 09/17/2012    PCP: Cathlean Sauer, MD  REFERRING PROVIDER: Rosemarie Ax, MD  REFERRING DIAG: [phone] (ICD-10-CM) - Wrist sprain, right, initial encounter M11.261 (ICD-10-CM) - Chondrocalcinosis of right knee M25.532 (ICD-10-CM) - Arthralgia of left wrist S16.1XXA (ICD-10-CM) - Strain of neck muscle, initial encounter S06.0X0A (ICD-10-CM) - Concussion without loss of consciousness, initial encounter M19.012 (ICD-10-CM) - Primary osteoarthritis of left shoulder  THERAPY DIAG:   Cervicalgia  Acute pain of right shoulder  Acute pain of left shoulder  Pain in right wrist  Pain in left wrist  Muscle spasm of back  Muscle weakness (generalized)  Rationale for Evaluation and Treatment: Rehabilitation  ONSET DATE: 03/23/2022  SUBJECTIVE:                                                                                                                                                                                                         SUBJECTIVE STATEMENT: 04/29/22:  Hurt L upper traps, still some dizziness and memory loss. Cant move my head quickly due to pain    [name] had a bad fall "it was a doozy" she fell forward and hit her head, had a concussion.  She laid out on the ground for about 20-30 min before neighbor heard her calling.  She has developed a stabbing pain in her back both sides just below shoulder blades.  She also hurt her neck, she has fluid in her L shoulder pressing on nerve endings , has a broken bone, has short term memory loss, had some from chemo, she also has a broken bone in L wrist, hairline fractures in L wrist, maybe fell a little harder on the left, but R shoulder also hurts, sprained her R wrist, but her knee is doing better not hurting as much.  They will hurt but nothing like upper body.  But my ankles, toes, and feet hurt but not as much as shoulders and neck.  Doctor told me it was like getting hit by linebacker.  I have blinding headaches that are worse in afternoon and at night.  I have a wonderful home with long driveway, went to mailbox, was reading mail when walking and tripped over a crack.  That's how I fell.  Concussion left her dizzy and blurred vision for a week and confused, but better now.   NEXT MD VISIT: 05/03/2023 with Dr. Raeford Razor  PERTINENT HISTORY:  History of breast cancer, short term memory loss, peripheral neuropathy, stent in heart, Afib, long term anticoagulation therapy,  history of CVA, cervical  spondylosis, migraines  PAIN:  Are you having pain? Yes: NPRS scale: 7/10 Pain location: neck, goes behind ear, numbness in face and forehead on Left side Pain description: sharp, shoot, burning, radiates Aggravating factors: moving Relieving factors: heat  Are you having pain? Yes: NPRS scale: 7-8/10 Pain location: L shoulder  Pain description: throbbing, aching Aggravating factors: increases as day progresses Relieving factors: heat, propping it up on pillows, wearing sling  Are you having pain? Yes: NPRS scale: 9/10 Pain location: L wrist, R wrist  3/10 Pain description: throbbing, sharp, shooting, burning Aggravating factors: using thumb Relieving factors: splints  Are you having pain? Yes: NPRS scale: 3/10 Pain location: upper back  L>R Pain description: aching, stabbing in evening Aggravating factors: increases in evening Relieving factors: heat  PRECAUTIONS: Fall  WEIGHT BEARING RESTRICTIONS: Yes no lifting or pushing with LUE  FALLS:  Has patient fallen in last 6 months? Yes. Number of falls 1  LIVING ENVIRONMENT: Lives with: lives alone Lives in: House/apartment Stairs:  lives downstairs, had full bedroom and bathroom, avoids going upstairs now. Has 1 step up from garage, no handrail, 3 steps at front door, has handrail.   Has following equipment at home: Single point cane, Walker - 2 wheeled, Environmental consultant - 4 wheeled, Manufacturing engineer  OCCUPATION: retired   PLOF: Independent  PATIENT GOALS: 1) get pain down better then 2) be more mobile, be able to cut food up again.   NEXT MD VISIT: 05/03/2023  OBJECTIVE:   DIAGNOSTIC FINDINGS:  03/23/22 R shoulder FINDINGS: Mild widening of the [facility] joint with relative elevation of the distal clavicle. This suggests ligamentous injury at the [facility] joint. No evidence of regional fracture. Humeral head is properly located. 03/23/22 L shoulder FINDINGS: There is no evidence of fracture or dislocation. There is no evidence of  arthropathy or other focal bone abnormality. Soft tissues are unremarkable. 03/23/22 CT Cervical  Spine IMPRESSION: 1. No acute intracranial process. 2. Atrophy with chronic microvascular ischemic changes. 3. No evidence of facial bone fracture. 4. Degenerative changes in the cervical spine without evidence of acute fracture. 03/23/22 DG Wrist R IMPRESSION: No acute or traumatic finding. Chronic degenerative changes at the first carpometacarpal joint and articulation of the multangular bones and scaphoid. 03/23/22 DG Wrist Left IMPRESSION: No acute or traumatic finding. Chronic degenerative changes at the first carpometacarpal joint. Chondrocalcinosis of the wrist joint.  04/19/22 Limited ultrasound: Left wrist pain/left shoulder pain:   Left wrist: Effusion noted within the carpal joints. There is an intense area of hyperemia at the distal radius within the joint.  There is no changes of the distal third of the radius. Degenerative changes of the scaphoid with no hyperemia. Degenerative changes of the [facility] joint.   Left shoulder: Effusion noted within the biceps tendon sheath. Chronic changes of the subscapularis. AC joint with degenerative changes. Chronic tendinopathy of the supraspinatus   Summary: Findings consistent with intra-articular distal radius fracture and effusion of the glenohumeral joint   Ultrasound and interpretation by Clearance Coots, MD  PATIENT SURVEYS:  NDI 39/50= 78% impairment Quick Dash 95.5% impairment  COGNITION: Overall cognitive status: History of cognitive impairments - at baseline and reports short term memory loss, increased confusion and memory difficulties following recent concussion  SENSATION: Bil neuropathy in feet from chemotherapy  POSTURE: rounded shoulders and forward head  PALPATION:    CERVICAL ROM:   Active ROM AROM (deg) eval  Flexion 15p!  Extension 10p!  Right lateral flexion   Left lateral flexion   Right rotation 20p!   Left rotation 25p!   (Blank rows = not tested)  UPPER EXTREMITY ROM:  Active ROM Right eval Left eval  Shoulder flexion 80p! 60p!  Shoulder extension    Shoulder abduction 80p! 40p!  Shoulder adduction    Shoulder extension    Shoulder internal rotation Functional to R PSIS Unable to reach behind  Shoulder external rotation Functional to R ear/forehead Unable to lift arm   Elbow flexion    Elbow extension    Wrist flexion    Wrist extension    Wrist ulnar deviation    Wrist radial deviation    Wrist pronation    Wrist supination     (Blank rows = not tested)  UPPER EXTREMITY MMT:  MMT Right eval Left eval  Shoulder flexion    Shoulder extension    Shoulder abduction    Shoulder adduction    Shoulder extension    Shoulder internal rotation    Shoulder external rotation    Middle trapezius    Lower trapezius    Elbow flexion    Elbow extension    Wrist flexion    Wrist extension    Wrist ulnar deviation    Wrist radial deviation    Wrist pronation    Wrist supination    Grip strength     (Blank rows = not tested)   FUNCTIONAL TESTS:  5 times sit to stand: 30 seconds without UE assist  TODAY'S TREATMENT:  DATE:  04/29/22:Manual: Supine for light cross friction massage, myofascial release, B upper traps, cervical suboccipital musculature Therex:  the patient was instructed in isometrics for her cervical retraction, 10 sec holds, 5 rep intervals Also isometric B shoulder ext in supine to engage scapular retractors and postural stabilizers.  E stim:  IFC 4 pads, 2 post cervical paraspinals, 2 middle traps, with moist heat cervical spine and upper traps, in supine.  04/27/22 - see patient education.    PATIENT EDUCATION:  Education details: plan of care Person educated: Patient Education method: Explanation Education comprehension:  verbalized understanding  HOME EXERCISE PROGRAM: TBD  ASSESSMENT:  CLINICAL IMPRESSION: Keshaun Newnham attended skilled PT today for her second visit, the first since initial evaluation due to a hard fall with multiple trauma B hands, c spine.  Still experiencing some dizziness, headaches.  Cervical rotation ROM restricted and painful B.  Today's treatment was focused on gentle techniques designed to reduce her tissue irritability, including light manual techniques and electrical stimulation.  Initiated light isometrics, she had some pain with the cervical retractions so advised to reduce the intensity level. She responded well to the e stim, advised her that she may wish to pursue TENS unit in future .  Will continue to provide skilled PT to address her pain and gradually increase her mobility c spine and shoulders as she is able to tolerate.possibly would benefit from kinesiotaping for postural assistance .   OBJECTIVE IMPAIRMENTS: decreased activity tolerance, decreased balance, decreased endurance, decreased mobility, difficulty walking, decreased ROM, decreased strength, decreased safety awareness, dizziness, increased fascial restrictions, increased muscle spasms, impaired sensation, impaired UE functional use, postural dysfunction, and pain.   ACTIVITY LIMITATIONS: carrying, lifting, bending, sitting, standing, squatting, sleeping, stairs, transfers, bed mobility, bathing, dressing, self feeding, reach over head, hygiene/grooming, and locomotion level  PARTICIPATION LIMITATIONS: meal prep, cleaning, laundry, medication management, driving, shopping, and community activity  PERSONAL FACTORS: Age, Time since onset of injury/illness/exacerbation, Transportation, and 3+ comorbidities: hx of migraines, history of cerebral infarction, history of DVT, history of paroxysmal AF on chronic anticoagulation with warfarin (also taking Plavix), CAD (LAD stent 2021) history ANA positive, adjustment insomnia,  chronic pain, history of malignant neoplasm left breast, zoster, B12 deficiency, vitamin D deficiency, osteoporosis  are also affecting patient's functional outcome.   REHAB POTENTIAL: Good  CLINICAL DECISION MAKING: Evolving/moderate complexity  EVALUATION COMPLEXITY: Moderate   GOALS: Goals reviewed with patient? Yes  SHORT TERM GOALS: Target date: 05/18/2022   Patient will be independent with initial HEP.  Baseline:  Goal status: INITIAL   Patient will have balance evaluated with appropriate goal set if deficits.  Baseline:  Goal status: INITIAL   LONG TERM GOALS: Target date: 06/22/2022   Patient will be independent with advanced/ongoing HEP to improve outcomes and carryover.  Baseline:  Goal status: INITIAL  2.  Patient will report 75% improvement in neck pain to improve QOL.  Baseline:  Goal status: INITIAL  3.  Patient will demonstrate 15 deg improvement in cervical ROM without pain for safety with walking.    Baseline: see objective Goal status: INITIAL  4.  Patient will report 15% improvement on NDI to demonstrate improved functional ability.  Baseline: 78% impairment Goal status: INITIAL  5. Patient will report 75% improvement in bil shoulder pain to improve QOL.  Baseline: severe Goal status: INITIAL  6.  Patient to improve bil shoulder AROM to [facility] without pain provocation to allow for increased ease of ADLs.  Baseline: see objective Goal status:  INITIAL  7.  Patient will report <50% impairment on QuickDash to demonstrate improved functional ability.  Baseline: 95.5% impairment Goal status: INITIAL    PLAN:  PT FREQUENCY: 2x/week  PT DURATION: 8 weeks  PLANNED INTERVENTIONS: Therapeutic exercises, Therapeutic activity, Neuromuscular re-education, Balance training, Gait training, Patient/Family education, Self Care, Joint mobilization, Stair training, Vestibular training, Visual/preceptual remediation/compensation, Dry Needling, Electrical  stimulation, Spinal mobilization, Cryotherapy, Moist heat, Taping, Ultrasound, Ionotophoresis 4mg /ml Dexamethasone, Manual therapy, and Re-evaluation  PLAN FOR NEXT SESSION:   Gentle postural exercises, isometrics as tolerated.   If continues to complain about dizziness after neck pain improves assess concussion.    Kylie Simmonds L Ahtziri Jeffries, PT, DPT  04/29/2022, 12:00 PM

## 2022-05-03 ENCOUNTER — Encounter: Payer: Self-pay | Admitting: Family Medicine

## 2022-05-03 ENCOUNTER — Ambulatory Visit (HOSPITAL_BASED_OUTPATIENT_CLINIC_OR_DEPARTMENT_OTHER)
Admission: RE | Admit: 2022-05-03 | Discharge: 2022-05-03 | Disposition: A | Discharge status: Home / Self Care | Payer: Medicare Other | Source: Ambulatory Visit | Attending: Family Medicine | Admitting: Family Medicine

## 2022-05-03 ENCOUNTER — Ambulatory Visit (INDEPENDENT_AMBULATORY_CARE_PROVIDER_SITE_OTHER): Payer: Medicare Other | Admitting: Family Medicine

## 2022-05-03 VITALS — BP 130/78 | Ht 59.75 in | Wt 103.0 lb

## 2022-05-03 DIAGNOSIS — M1812 Unilateral primary osteoarthritis of first carpometacarpal joint, left hand: Secondary | ICD-10-CM

## 2022-05-03 DIAGNOSIS — S52592D Other fractures of lower end of left radius, subsequent encounter for closed fracture with routine healing: Secondary | ICD-10-CM | POA: Insufficient documentation

## 2022-05-03 DIAGNOSIS — S060X0D Concussion without loss of consciousness, subsequent encounter: Secondary | ICD-10-CM

## 2022-05-03 NOTE — Assessment & Plan Note (Signed)
Independent review of the left wrist x-ray from today shows no acute fracture but does show significant degenerative change. -Counseled on home exercise therapy and supportive care. -Counseled on using the thumb spica brace -Could consider injection.

## 2022-05-03 NOTE — Progress Notes (Signed)
  Monica Khan - 77 y.o. female MRN [id number]  Date of birth: Jun 11, 1945  SUBJECTIVE:  Including CC & ROS.  No chief complaint on file.   Monica Khan is a 77 y.o. female that is following up for her left wrist pain and neck pain and concussion.  She still has trouble with word finding since her initial injury.  The brace does help her left wrist.  She does get swelling and pain if she uses the wrist for longer than an hour.  Her neck is getting better range of motion and improvement in pain since starting physical therapy.    Review of Systems See HPI   HISTORY: Past Medical, Surgical, Social, and Family History Reviewed & Updated per EMR.   Pertinent Historical Findings include:  Past Medical History:  Diagnosis Date   Breast cancer ([place])    In remission   Fluttering heart    Gastritis    Migraines    Peripheral neuropathy    Shingles    Recurrent    Past Surgical History:  Procedure Laterality Date   ABDOMINAL HYSTERECTOMY     APPENDECTOMY     CHOLECYSTECTOMY     TOTAL MASTECTOMY       PHYSICAL EXAM:  VS: BP 130/78 (BP Location: Right Arm, Patient Position: Sitting)   Ht 4' 11.75" (1.518 m)   Wt 103 lb (46.7 kg)   BMI 20.28 kg/m  Physical Exam Gen: NAD, alert, cooperative with exam, well-appearing MSK:  Neurovascularly intact       ASSESSMENT & PLAN:   Concussion with no loss of consciousness Still has some remnants of symptoms after initial injury on 2/13.  Slowly getting improvement. -Counseled on home exercise therapy and supportive care. -Continue physical therapy  Arthritis of carpometacarpal (CMC) joint of left thumb Independent review of the left wrist x-ray from today shows no acute fracture but does show significant degenerative change. -Counseled on home exercise therapy and supportive care. -Counseled on using the thumb spica brace -Could consider injection.

## 2022-05-03 NOTE — Patient Instructions (Signed)
Good to see you Please use heat  Please try to wean out of the brace  Please send me a message in MyChart with any questions or updates.  Please see me back in 4 weeks.   --Dr. Raeford Razor

## 2022-05-03 NOTE — Assessment & Plan Note (Signed)
Still has some remnants of symptoms after initial injury on 2/13.  Slowly getting improvement. -Counseled on home exercise therapy and supportive care. -Continue physical therapy

## 2022-05-05 ENCOUNTER — Ambulatory Visit: Payer: Medicare Other | Admitting: Physical Therapy

## 2022-05-07 ENCOUNTER — Ambulatory Visit: Payer: Medicare Other

## 2022-05-07 DIAGNOSIS — M542 Cervicalgia: Secondary | ICD-10-CM

## 2022-05-07 DIAGNOSIS — M25512 Pain in left shoulder: Secondary | ICD-10-CM

## 2022-05-07 DIAGNOSIS — M6281 Muscle weakness (generalized): Secondary | ICD-10-CM

## 2022-05-07 DIAGNOSIS — M25511 Pain in right shoulder: Secondary | ICD-10-CM

## 2022-05-07 DIAGNOSIS — M25531 Pain in right wrist: Secondary | ICD-10-CM

## 2022-05-07 DIAGNOSIS — M25532 Pain in left wrist: Secondary | ICD-10-CM

## 2022-05-07 DIAGNOSIS — M62830 Muscle spasm of back: Secondary | ICD-10-CM

## 2022-05-07 NOTE — Therapy (Signed)
- OUTPATIENT PHYSICAL THERAPY CERVICAL TREATMENT   Patient Name: Monica Khan MRN: [id number] DOB:07-16-1945, 77 y.o., female Today's Date: 05/07/2022  END OF SESSION:  PT End of Session - 05/07/22 1022     Visit Number 3    Date for PT Re-Evaluation 06/22/22    Authorization Type Medicare + BCBS    Progress Note Due on Visit 10    PT Start Time 1011    PT Stop Time 1113    PT Time Calculation (min) 62 min    Activity Tolerance Patient tolerated treatment well    Behavior During Therapy WFL for tasks assessed/performed               Past Medical History:  Diagnosis Date   Breast cancer ([place])    In remission   Fluttering heart    Gastritis    Migraines    Peripheral neuropathy    Shingles    Recurrent   Past Surgical History:  Procedure Laterality Date   ABDOMINAL HYSTERECTOMY     APPENDECTOMY     CHOLECYSTECTOMY     TOTAL MASTECTOMY     Patient Active Problem List   Diagnosis Date Noted   Wrist sprain, right, initial encounter 04/12/2022   Chondrocalcinosis of right knee 04/12/2022   Arthritis of carpometacarpal ([place]) joint of left thumb 04/12/2022   Cervical strain 04/12/2022   Concussion with no loss of consciousness 04/12/2022   Primary osteoarthritis of left shoulder 04/12/2022   Protein-calorie malnutrition, severe ([place]) 09/20/2012   Hepatocellular injury 09/17/2012   Transaminitis 09/17/2012   Nausea and vomiting 09/17/2012   Shingles 09/17/2012   History of breast cancer 09/17/2012    PCP: Cathlean Sauer, MD  REFERRING PROVIDER: Rosemarie Ax, MD  REFERRING DIAG: [phone] (ICD-10-CM) - Wrist sprain, right, initial encounter M11.261 (ICD-10-CM) - Chondrocalcinosis of right knee M25.532 (ICD-10-CM) - Arthralgia of left wrist S16.1XXA (ICD-10-CM) - Strain of neck muscle, initial encounter S06.0X0A (ICD-10-CM) - Concussion without loss of consciousness, initial encounter M19.012 (ICD-10-CM) - Primary osteoarthritis of left  shoulder  THERAPY DIAG:  Cervicalgia  Acute pain of right shoulder  Acute pain of left shoulder  Pain in right wrist  Pain in left wrist  Muscle spasm of back  Muscle weakness (generalized)  Rationale for Evaluation and Treatment: Rehabilitation  ONSET DATE: 03/23/2022  SUBJECTIVE:  SUBJECTIVE STATEMENT: Still neck pain and shoulder pain.     [name] had a bad fall "it was a doozy" she fell forward and hit her head, had a concussion.  She laid out on the ground for about 20-30 min before neighbor heard her calling.  She has developed a stabbing pain in her back both sides just below shoulder blades.  She also hurt her neck, she has fluid in her L shoulder pressing on nerve endings , has a broken bone, has short term memory loss, had some from chemo, she also has a broken bone in L wrist, hairline fractures in L wrist, maybe fell a little harder on the left, but R shoulder also hurts, sprained her R wrist, but her knee is doing better not hurting as much.  They will hurt but nothing like upper body.  But my ankles, toes, and feet hurt but not as much as shoulders and neck.  Doctor told me it was like getting hit by linebacker.  I have blinding headaches that are worse in afternoon and at night.  I have a wonderful home with long driveway, went to mailbox, was reading mail when walking and tripped over a crack.  That's how I fell.  Concussion left her dizzy and blurred vision for a week and confused, but better now.   NEXT MD VISIT: 05/03/2023 with Dr. Raeford Razor  PERTINENT HISTORY:  History of breast cancer, short term memory loss, peripheral neuropathy, stent in heart, Afib, long term anticoagulation therapy,  history of CVA, cervical spondylosis, migraines  PAIN:  Are you having pain?  Yes: NPRS scale: 6/10 Pain location: neck L side and bil shoulders Pain description: sharp, shoot, burning, radiates Aggravating factors: moving Relieving factors: heat  Are you having pain? Yes: NPRS scale: 7-8/10 Pain location: L shoulder  Pain description: throbbing, aching Aggravating factors: increases as day progresses Relieving factors: heat, propping it up on pillows, wearing sling  Are you having pain? Yes: NPRS scale: 7/10 Pain location: L wrist, R wrist  5/10 Pain description: throbbing, sharp, shooting, burning Aggravating factors: using thumb Relieving factors: splints  Are you having pain? Yes: NPRS scale: 3/10 Pain location: upper back  L>R Pain description: aching, stabbing in evening Aggravating factors: increases in evening Relieving factors: heat  PRECAUTIONS: Fall  WEIGHT BEARING RESTRICTIONS: Yes no lifting or pushing with LUE  FALLS:  Has patient fallen in last 6 months? Yes. Number of falls 1  LIVING ENVIRONMENT: Lives with: lives alone Lives in: House/apartment Stairs:  lives downstairs, had full bedroom and bathroom, avoids going upstairs now. Has 1 step up from garage, no handrail, 3 steps at front door, has handrail.   Has following equipment at home: Single point cane, Walker - 2 wheeled, Environmental consultant - 4 wheeled, Manufacturing engineer  OCCUPATION: retired   PLOF: Independent  PATIENT GOALS: 1) get pain down better then 2) be more mobile, be able to cut food up again.   NEXT MD VISIT: 05/03/2023  OBJECTIVE:   DIAGNOSTIC FINDINGS:  03/23/22 R shoulder FINDINGS: Mild widening of the [facility] joint with relative elevation of the distal clavicle. This suggests ligamentous injury at the [facility] joint. No evidence of regional fracture. Humeral head is properly located. 03/23/22 L shoulder FINDINGS: There is no evidence of fracture or dislocation. There is no evidence of arthropathy or other focal bone abnormality. Soft tissues are unremarkable. 03/23/22 CT Cervical  Spine IMPRESSION: 1. No acute intracranial process. 2. Atrophy with chronic microvascular ischemic changes. 3.  No evidence of facial bone fracture. 4. Degenerative changes in the cervical spine without evidence of acute fracture. 03/23/22 DG Wrist R IMPRESSION: No acute or traumatic finding. Chronic degenerative changes at the first carpometacarpal joint and articulation of the multangular bones and scaphoid. 03/23/22 DG Wrist Left IMPRESSION: No acute or traumatic finding. Chronic degenerative changes at the first carpometacarpal joint. Chondrocalcinosis of the wrist joint.  04/19/22 Limited ultrasound: Left wrist pain/left shoulder pain:   Left wrist: Effusion noted within the carpal joints. There is an intense area of hyperemia at the distal radius within the joint.  There is no changes of the distal third of the radius. Degenerative changes of the scaphoid with no hyperemia. Degenerative changes of the [facility] joint.   Left shoulder: Effusion noted within the biceps tendon sheath. Chronic changes of the subscapularis. AC joint with degenerative changes. Chronic tendinopathy of the supraspinatus   Summary: Findings consistent with intra-articular distal radius fracture and effusion of the glenohumeral joint   Ultrasound and interpretation by Clearance Coots, MD  PATIENT SURVEYS:  NDI 39/50= 78% impairment Quick Dash 95.5% impairment  COGNITION: Overall cognitive status: History of cognitive impairments - at baseline and reports short term memory loss, increased confusion and memory difficulties following recent concussion  SENSATION: Bil neuropathy in feet from chemotherapy  POSTURE: rounded shoulders and forward head  PALPATION:    CERVICAL ROM:   Active ROM AROM (deg) eval  Flexion 15p!  Extension 10p!  Right lateral flexion   Left lateral flexion   Right rotation 20p!  Left rotation 25p!   (Blank rows = not tested)  UPPER EXTREMITY ROM:  Active ROM Right eval  Left eval  Shoulder flexion 80p! 60p!  Shoulder extension    Shoulder abduction 80p! 40p!  Shoulder adduction    Shoulder extension    Shoulder internal rotation Functional to R PSIS Unable to reach behind  Shoulder external rotation Functional to R ear/forehead Unable to lift arm   Elbow flexion    Elbow extension    Wrist flexion    Wrist extension    Wrist ulnar deviation    Wrist radial deviation    Wrist pronation    Wrist supination     (Blank rows = not tested)  UPPER EXTREMITY MMT:  MMT Right eval Left eval  Shoulder flexion    Shoulder extension    Shoulder abduction    Shoulder adduction    Shoulder extension    Shoulder internal rotation    Shoulder external rotation    Middle trapezius    Lower trapezius    Elbow flexion    Elbow extension    Wrist flexion    Wrist extension    Wrist ulnar deviation    Wrist radial deviation    Wrist pronation    Wrist supination    Grip strength     (Blank rows = not tested)   FUNCTIONAL TESTS:  5 times sit to stand: 30 seconds without UE assist  TODAY'S TREATMENT:  DATE:  05/07/22 Therapeutic Exercise: to improve strength and mobility.  Demo, verbal and tactile cues throughout for technique.  Seated scap retraction and shoulder rolls 2x10  Education on proper posture Chin tucks Manual Therapy: STM to B UT, LS, cervical PS, suboccipitals more tight on L  E stim:  IFC 4 pads, 2 post cervical paraspinals, 2 middle traps, with moist heat cervical spine and upper traps, in supine. 15  04/29/22:Manual: Supine for light cross friction massage, myofascial release, B upper traps, cervical suboccipital musculature Therex:  the patient was instructed in isometrics for her cervical retraction, 10 sec holds, 5 rep intervals Also isometric B shoulder ext in supine to engage scapular retractors and  postural stabilizers.  E stim:  IFC 4 pads, 2 post cervical paraspinals, 2 middle traps, with moist heat cervical spine and upper traps, in supine.  04/27/22 - see patient education.    PATIENT EDUCATION:  Education details: plan of care Person educated: Patient Education method: Explanation Education comprehension: verbalized understanding  HOME EXERCISE PROGRAM: TBD  ASSESSMENT:  CLINICAL IMPRESSION: Pt continues to be  tight and stiff in neck and shoulders. MT was the primary focus d/t high pain levels and impaired mobility observed from patient. Made improvement with manual therapy and noted improved mobility afterwards per patient. Concluded session with estim to B cervical PS and UT to decrease pain and improve mobility.   OBJECTIVE IMPAIRMENTS: decreased activity tolerance, decreased balance, decreased endurance, decreased mobility, difficulty walking, decreased ROM, decreased strength, decreased safety awareness, dizziness, increased fascial restrictions, increased muscle spasms, impaired sensation, impaired UE functional use, postural dysfunction, and pain.   ACTIVITY LIMITATIONS: carrying, lifting, bending, sitting, standing, squatting, sleeping, stairs, transfers, bed mobility, bathing, dressing, self feeding, reach over head, hygiene/grooming, and locomotion level  PARTICIPATION LIMITATIONS: meal prep, cleaning, laundry, medication management, driving, shopping, and community activity  PERSONAL FACTORS: Age, Time since onset of injury/illness/exacerbation, Transportation, and 3+ comorbidities: hx of migraines, history of cerebral infarction, history of DVT, history of paroxysmal AF on chronic anticoagulation with warfarin (also taking Plavix), CAD (LAD stent 2021) history ANA positive, adjustment insomnia, chronic pain, history of malignant neoplasm left breast, zoster, B12 deficiency, vitamin D deficiency, osteoporosis  are also affecting patient's functional outcome.   REHAB  POTENTIAL: Good  CLINICAL DECISION MAKING: Evolving/moderate complexity  EVALUATION COMPLEXITY: Moderate   GOALS: Goals reviewed with patient? Yes  SHORT TERM GOALS: Target date: 05/18/2022   Patient will be independent with initial HEP.  Baseline:  Goal status: INITIAL   Patient will have balance evaluated with appropriate goal set if deficits.  Baseline:  Goal status: INITIAL   LONG TERM GOALS: Target date: 06/22/2022   Patient will be independent with advanced/ongoing HEP to improve outcomes and carryover.  Baseline:  Goal status: INITIAL  2.  Patient will report 75% improvement in neck pain to improve QOL.  Baseline:  Goal status: INITIAL  3.  Patient will demonstrate 15 deg improvement in cervical ROM without pain for safety with walking.    Baseline: see objective Goal status: INITIAL  4.  Patient will report 15% improvement on NDI to demonstrate improved functional ability.  Baseline: 78% impairment Goal status: INITIAL  5. Patient will report 75% improvement in bil shoulder pain to improve QOL.  Baseline: severe Goal status: INITIAL  6.  Patient to improve bil shoulder AROM to [facility] without pain provocation to allow for increased ease of ADLs.  Baseline: see objective Goal status: INITIAL  7.  Patient will report <50%  impairment on QuickDash to demonstrate improved functional ability.  Baseline: 95.5% impairment Goal status: INITIAL    PLAN:  PT FREQUENCY: 2x/week  PT DURATION: 8 weeks  PLANNED INTERVENTIONS: Therapeutic exercises, Therapeutic activity, Neuromuscular re-education, Balance training, Gait training, Patient/Family education, Self Care, Joint mobilization, Stair training, Vestibular training, Visual/preceptual remediation/compensation, Dry Needling, Electrical stimulation, Spinal mobilization, Cryotherapy, Moist heat, Taping, Ultrasound, Ionotophoresis 4mg /ml Dexamethasone, Manual therapy, and Re-evaluation  PLAN FOR NEXT SESSION:   Gentle  postural exercises, isometrics as tolerated.   If continues to complain about dizziness after neck pain improves assess concussion.    Artist Pais, PTA 05/07/2022, 12:04 PM

## 2022-05-11 ENCOUNTER — Other Ambulatory Visit: Payer: Self-pay | Admitting: Family Medicine

## 2022-05-11 ENCOUNTER — Ambulatory Visit: Payer: Medicare Other | Attending: Family Medicine

## 2022-05-11 DIAGNOSIS — M25532 Pain in left wrist: Secondary | ICD-10-CM | POA: Insufficient documentation

## 2022-05-11 DIAGNOSIS — M6281 Muscle weakness (generalized): Secondary | ICD-10-CM | POA: Diagnosis present

## 2022-05-11 DIAGNOSIS — M62830 Muscle spasm of back: Secondary | ICD-10-CM | POA: Diagnosis present

## 2022-05-11 DIAGNOSIS — M25512 Pain in left shoulder: Secondary | ICD-10-CM | POA: Diagnosis present

## 2022-05-11 DIAGNOSIS — M1812 Unilateral primary osteoarthritis of first carpometacarpal joint, left hand: Secondary | ICD-10-CM

## 2022-05-11 DIAGNOSIS — M542 Cervicalgia: Secondary | ICD-10-CM

## 2022-05-11 DIAGNOSIS — M25511 Pain in right shoulder: Secondary | ICD-10-CM | POA: Insufficient documentation

## 2022-05-11 DIAGNOSIS — M25531 Pain in right wrist: Secondary | ICD-10-CM | POA: Insufficient documentation

## 2022-05-11 NOTE — Therapy (Signed)
- OUTPATIENT PHYSICAL THERAPY CERVICAL TREATMENT   Patient Name: Monica Khan MRN: [id number] DOB:17-Oct-1945, 77 y.o., female Today's Date: 05/11/2022  END OF SESSION:  PT End of Session - 05/11/22 1204     Visit Number 4    Number of Visits 16    Date for PT Re-Evaluation 06/22/22    Authorization Type Medicare + BCBS    Progress Note Due on Visit 10    PT Start Time 0932    PT Stop Time 1015    PT Time Calculation (min) 43 min    Activity Tolerance Patient tolerated treatment well    Behavior During Therapy WFL for tasks assessed/performed                Past Medical History:  Diagnosis Date   Breast cancer ([place])    In remission   Fluttering heart    Gastritis    Migraines    Peripheral neuropathy    Shingles    Recurrent   Past Surgical History:  Procedure Laterality Date   ABDOMINAL HYSTERECTOMY     APPENDECTOMY     CHOLECYSTECTOMY     TOTAL MASTECTOMY     Patient Active Problem List   Diagnosis Date Noted   Wrist sprain, right, initial encounter 04/12/2022   Chondrocalcinosis of right knee 04/12/2022   Arthritis of carpometacarpal ([place]) joint of left thumb 04/12/2022   Cervical strain 04/12/2022   Concussion with no loss of consciousness 04/12/2022   Primary osteoarthritis of left shoulder 04/12/2022   Protein-calorie malnutrition, severe 09/20/2012   Hepatocellular injury 09/17/2012   Transaminitis 09/17/2012   Nausea and vomiting 09/17/2012   Shingles 09/17/2012   History of breast cancer 09/17/2012    PCP: Monica Sauer, MD  REFERRING PROVIDER: Rosemarie Ax, MD  REFERRING DIAG: [phone] (ICD-10-CM) - Wrist sprain, right, initial encounter M11.261 (ICD-10-CM) - Chondrocalcinosis of right knee M25.532 (ICD-10-CM) - Arthralgia of left wrist S16.1XXA (ICD-10-CM) - Strain of neck muscle, initial encounter S06.0X0A (ICD-10-CM) - Concussion without loss of consciousness, initial encounter M19.012 (ICD-10-CM) - Primary osteoarthritis of left  shoulder  THERAPY DIAG:  Cervicalgia  Acute pain of right shoulder  Acute pain of left shoulder  Pain in right wrist  Pain in left wrist  Muscle spasm of back  Muscle weakness (generalized)  Rationale for Evaluation and Treatment: Rehabilitation  ONSET DATE: 03/23/2022  SUBJECTIVE:  SUBJECTIVE STATEMENT: Still neck pain and shoulder pain. My L wrist is hurting so much and the Spica splint hurts, so I wear the wrist immobilizer splint    Monica Khan had a bad fall "it was a doozy" she fell forward and hit her head, had a concussion.  She laid out on the ground for about 20-30 min before neighbor heard her calling.  She has developed a stabbing pain in her back both sides just below shoulder blades.  She also hurt her neck, she has fluid in her L shoulder pressing on nerve endings , has a broken bone, has short term memory loss, had some from chemo, she also has a broken bone in L wrist, hairline fractures in L wrist, maybe fell a little harder on the left, but R shoulder also hurts, sprained her R wrist, but her knee is doing better not hurting as much.  They will hurt but nothing like upper body.  But my ankles, toes, and feet hurt but not as much as shoulders and neck.  Doctor told me it was like getting hit by linebacker.  I have blinding headaches that are worse in afternoon and at night.  I have a wonderful home with long driveway, went to mailbox, was reading mail when walking and tripped over a crack.  That's how I fell.  Concussion left her dizzy and blurred vision for a week and confused, but better now.   NEXT MD VISIT: 05/03/2023 with Dr. Raeford Khan  PERTINENT HISTORY:  History of breast cancer, short term memory loss, peripheral neuropathy, stent in heart, Afib, long term  anticoagulation therapy,  history of CVA, cervical spondylosis, migraines  PAIN:  Are you having pain? Yes: NPRS scale: 6/10 Pain location: neck L side and bil shoulders Pain description: sharp, shoot, burning, radiates Aggravating factors: moving Relieving factors: heat  Are you having pain? Yes: NPRS scale: 7-8/10 Pain location: L shoulder  Pain description: throbbing, aching Aggravating factors: increases as day progresses Relieving factors: heat, propping it up on pillows, wearing sling  Are you having pain? Yes: NPRS scale: 7/10 Pain location: L wrist, R wrist  5/10 Pain description: throbbing, sharp, shooting, burning Aggravating factors: using thumb Relieving factors: splints  Are you having pain? Yes: NPRS scale: 3/10 Pain location: upper back  L>R Pain description: aching, stabbing in evening Aggravating factors: increases in evening Relieving factors: heat  PRECAUTIONS: Fall  WEIGHT BEARING RESTRICTIONS: Yes no lifting or pushing with LUE  FALLS:  Has patient fallen in last 6 months? Yes. Number of falls 1  LIVING ENVIRONMENT: Lives with: lives alone Lives in: House/apartment Stairs:  lives downstairs, had full bedroom and bathroom, avoids going upstairs now. Has 1 step up from garage, no handrail, 3 steps at front door, has handrail.   Has following equipment at home: Single point cane, Walker - 2 wheeled, Environmental consultant - 4 wheeled, Manufacturing engineer  OCCUPATION: retired   PLOF: Independent  PATIENT GOALS: 1) get pain down better then 2) be more mobile, be able to cut food up again.   NEXT MD VISIT: 05/03/2023  OBJECTIVE:   DIAGNOSTIC FINDINGS:  03/23/22 R shoulder FINDINGS: Mild widening of the [facility] joint with relative elevation of the distal clavicle. This suggests ligamentous injury at the [facility] joint. No evidence of regional fracture. Humeral head is properly located. 03/23/22 L shoulder FINDINGS: There is no evidence of fracture or dislocation. There is  no evidence of arthropathy or other focal bone abnormality. Soft tissues are unremarkable.  03/23/22 CT Cervical Spine IMPRESSION: 1. No acute intracranial process. 2. Atrophy with chronic microvascular ischemic changes. 3. No evidence of facial bone fracture. 4. Degenerative changes in the cervical spine without evidence of acute fracture. 03/23/22 DG Wrist R IMPRESSION: No acute or traumatic finding. Chronic degenerative changes at the first carpometacarpal joint and articulation of the multangular bones and scaphoid. 03/23/22 DG Wrist Left IMPRESSION: No acute or traumatic finding. Chronic degenerative changes at the first carpometacarpal joint. Chondrocalcinosis of the wrist joint.  04/19/22 Limited ultrasound: Left wrist pain/left shoulder pain:   Left wrist: Effusion noted within the carpal joints. There is an intense area of hyperemia at the distal radius within the joint.  There is no changes of the distal third of the radius. Degenerative changes of the scaphoid with no hyperemia. Degenerative changes of the [facility] joint.   Left shoulder: Effusion noted within the biceps tendon sheath. Chronic changes of the subscapularis. AC joint with degenerative changes. Chronic tendinopathy of the supraspinatus   Summary: Findings consistent with intra-articular distal radius fracture and effusion of the glenohumeral joint   Ultrasound and interpretation by Clearance Coots, MD  PATIENT SURVEYS:  NDI 39/50= 78% impairment Quick Dash 95.5% impairment  COGNITION: Overall cognitive status: History of cognitive impairments - at baseline and reports short term memory loss, increased confusion and memory difficulties following recent concussion  SENSATION: Bil neuropathy in feet from chemotherapy  POSTURE: rounded shoulders and forward head  PALPATION:    CERVICAL ROM:   Active ROM AROM (deg) eval  Flexion 15p!  Extension 10p!  Right lateral flexion   Left lateral flexion   Right  rotation 20p!  Left rotation 25p!   (Blank rows = not tested)  UPPER EXTREMITY ROM:  Active ROM Right eval Left eval  Shoulder flexion 80p! 60p!  Shoulder extension    Shoulder abduction 80p! 40p!  Shoulder adduction    Shoulder extension    Shoulder internal rotation Functional to R PSIS Unable to reach behind  Shoulder external rotation Functional to R ear/forehead Unable to lift arm   Elbow flexion    Elbow extension    Wrist flexion    Wrist extension    Wrist ulnar deviation    Wrist radial deviation    Wrist pronation    Wrist supination     (Blank rows = not tested)  UPPER EXTREMITY MMT:  MMT Right eval Left eval  Shoulder flexion    Shoulder extension    Shoulder abduction    Shoulder adduction    Shoulder extension    Shoulder internal rotation    Shoulder external rotation    Middle trapezius    Lower trapezius    Elbow flexion    Elbow extension    Wrist flexion    Wrist extension    Wrist ulnar deviation    Wrist radial deviation    Wrist pronation    Wrist supination    Grip strength     (Blank rows = not tested)   FUNCTIONAL TESTS:  5 times sit to stand: 30 seconds without UE assist  TODAY'S TREATMENT:  DATE:  05/10/22: Manual: Supine for gentle stretching, light cervical traction for 30 sec holds, interspersed with contract/ relax for upper cervical rotation B, 5 sec holds, 6 reps,with neck in flexed position, 2 sets.  Also contract /relax for L levator scapula 5 sec holds, 2 sets 5 reps.    Kinesiotaping, lantern cut, one I piece, extending from L dorsal forearm, to prox wrist, paper off, for edema management.   E stim:  IFC 4 pads, 2 post cervical paraspinals, 2 middle traps, with moist heat cervical spine and upper traps, in supine.   05/07/22 Therapeutic Exercise: to improve strength and mobility.  Demo, verbal  and tactile cues throughout for technique.  Seated scap retraction and shoulder rolls 2x10  Education on proper posture Chin tucks Manual Therapy: STM to B UT, LS, cervical PS, suboccipitals more tight on L  E stim:  IFC 4 pads, 2 post cervical paraspinals, 2 middle traps, with moist heat cervical spine and upper traps, in supine. 15  04/29/22:Manual: Supine for light cross friction massage, myofascial release, B upper traps, cervical suboccipital musculature Therex:  the patient was instructed in isometrics for her cervical retraction, 10 sec holds, 5 rep intervals Also isometric B shoulder ext in supine to engage scapular retractors and postural stabilizers.  E stim:  IFC 4 pads, 2 post cervical paraspinals, 2 middle traps, with moist heat cervical spine and upper traps, in supine.  04/27/22 - see patient education.    PATIENT EDUCATION:  Education details: plan of care Person educated: Patient Education method: Explanation Education comprehension: verbalized understanding  HOME EXERCISE PROGRAM: TBD  ASSESSMENT:  CLINICAL IMPRESSION: Pt with ongoing pain, loss of flexibility and function cervical spine, L shoulder, L wrist and hand.  Very concerned about L wrist pain , unable to tolerate more than 1 hour without splint intact.  Has edema L CMP jt as well as edema and pain L proximal radial musculature.  Tender with palpation over these same areas.  Did give patient handout regarding obtaining a TENS unit.  Also send inbasket message to referring MD to request OT referral for patient in addition to the PT referral.   OBJECTIVE IMPAIRMENTS: decreased activity tolerance, decreased balance, decreased endurance, decreased mobility, difficulty walking, decreased ROM, decreased strength, decreased safety awareness, dizziness, increased fascial restrictions, increased muscle spasms, impaired sensation, impaired UE functional use, postural dysfunction, and pain.   ACTIVITY LIMITATIONS:  carrying, lifting, bending, sitting, standing, squatting, sleeping, stairs, transfers, bed mobility, bathing, dressing, self feeding, reach over head, hygiene/grooming, and locomotion level  PARTICIPATION LIMITATIONS: meal prep, cleaning, laundry, medication management, driving, shopping, and community activity  PERSONAL FACTORS: Age, Time since onset of injury/illness/exacerbation, Transportation, and 3+ comorbidities: hx of migraines, history of cerebral infarction, history of DVT, history of paroxysmal AF on chronic anticoagulation with warfarin (also taking Plavix), CAD (LAD stent 2021) history ANA positive, adjustment insomnia, chronic pain, history of malignant neoplasm left breast, zoster, B12 deficiency, vitamin D deficiency, osteoporosis  are also affecting patient's functional outcome.   REHAB POTENTIAL: Good  CLINICAL DECISION MAKING: Evolving/moderate complexity  EVALUATION COMPLEXITY: Moderate   GOALS: Goals reviewed with patient? Yes  SHORT TERM GOALS: Target date: 05/18/2022   Patient will be independent with initial HEP.  Baseline:  Goal status: IN PROGRESS   Patient will have balance evaluated with appropriate goal set if deficits.  Baseline:  Goal status: INITIAL   LONG TERM GOALS: Target date: 06/22/2022   Patient will be independent with advanced/ongoing HEP to improve outcomes  and carryover.  Baseline:  Goal status: INITIAL  2.  Patient will report 75% improvement in neck pain to improve QOL.  Baseline:  Goal status: INITIAL  3.  Patient will demonstrate 15 deg improvement in cervical ROM without pain for safety with walking.    Baseline: see objective Goal status: INITIAL  4.  Patient will report 15% improvement on NDI to demonstrate improved functional ability.  Baseline: 78% impairment Goal status: INITIAL  5. Patient will report 75% improvement in bil shoulder pain to improve QOL.  Baseline: severe Goal status: INITIAL  6.  Patient to improve bil  shoulder AROM to [facility] without pain provocation to allow for increased ease of ADLs.  Baseline: see objective Goal status: IN PROGRESS  7.  Patient will report <50% impairment on QuickDash to demonstrate improved functional ability.  Baseline: 95.5% impairment Goal status: INITIAL    PLAN:  PT FREQUENCY: 2x/week  PT DURATION: 8 weeks  PLANNED INTERVENTIONS: Therapeutic exercises, Therapeutic activity, Neuromuscular re-education, Balance training, Gait training, Patient/Family education, Self Care, Joint mobilization, Stair training, Vestibular training, Visual/preceptual remediation/compensation, Dry Needling, Electrical stimulation, Spinal mobilization, Cryotherapy, Moist heat, Taping, Ultrasound, Ionotophoresis 4mg /ml Dexamethasone, Manual therapy, and Re-evaluation  PLAN FOR NEXT SESSION:   Gentle postural exercises, isometrics as tolerated. How was the kinesiotaping.     Farhan Jean L Meranda Dechaine, PT 05/11/2022, 1:14 PM

## 2022-05-12 ENCOUNTER — Other Ambulatory Visit: Payer: Self-pay | Admitting: Family Medicine

## 2022-05-12 DIAGNOSIS — M1812 Unilateral primary osteoarthritis of first carpometacarpal joint, left hand: Secondary | ICD-10-CM

## 2022-05-13 ENCOUNTER — Ambulatory Visit: Payer: Medicare Other | Admitting: Physical Therapy

## 2022-05-13 ENCOUNTER — Encounter: Payer: Self-pay | Admitting: Physical Therapy

## 2022-05-13 DIAGNOSIS — M542 Cervicalgia: Secondary | ICD-10-CM

## 2022-05-13 DIAGNOSIS — M25531 Pain in right wrist: Secondary | ICD-10-CM

## 2022-05-13 DIAGNOSIS — M6281 Muscle weakness (generalized): Secondary | ICD-10-CM

## 2022-05-13 DIAGNOSIS — M62830 Muscle spasm of back: Secondary | ICD-10-CM

## 2022-05-13 DIAGNOSIS — M25532 Pain in left wrist: Secondary | ICD-10-CM

## 2022-05-13 DIAGNOSIS — M25511 Pain in right shoulder: Secondary | ICD-10-CM

## 2022-05-13 DIAGNOSIS — M25512 Pain in left shoulder: Secondary | ICD-10-CM

## 2022-05-13 NOTE — Therapy (Signed)
OUTPATIENT PHYSICAL THERAPY CERVICAL TREATMENT   Patient Name: Yu Mcnish MRN: [id number] DOB:Jun 18, 1945, 77 y.o., female Today's Date: 05/13/2022  END OF SESSION:  PT End of Session - 05/13/22 1059     Visit Number 5    Number of Visits 16    Date for PT Re-Evaluation 06/22/22    Authorization Type Medicare + BCBS    Progress Note Due on Visit 10    PT Start Time 1100    PT Stop Time 1145    PT Time Calculation (min) 45 min    Activity Tolerance Patient tolerated treatment well    Behavior During Therapy WFL for tasks assessed/performed                Past Medical History:  Diagnosis Date   Breast cancer    In remission   Fluttering heart    Gastritis    Migraines    Peripheral neuropathy    Shingles    Recurrent   Past Surgical History:  Procedure Laterality Date   ABDOMINAL HYSTERECTOMY     APPENDECTOMY     CHOLECYSTECTOMY     TOTAL MASTECTOMY     Patient Active Problem List   Diagnosis Date Noted   Wrist sprain, right, initial encounter 04/12/2022   Chondrocalcinosis of right knee 04/12/2022   Arthritis of carpometacarpal ([place]) joint of left thumb 04/12/2022   Cervical strain 04/12/2022   Concussion with no loss of consciousness 04/12/2022   Primary osteoarthritis of left shoulder 04/12/2022   Protein-calorie malnutrition, severe 09/20/2012   Hepatocellular injury 09/17/2012   Transaminitis 09/17/2012   Nausea and vomiting 09/17/2012   Shingles 09/17/2012   History of breast cancer 09/17/2012    PCP: Cathlean Sauer, MD  REFERRING PROVIDER: Rosemarie Ax, MD  REFERRING DIAG: [phone] (ICD-10-CM) - Wrist sprain, right, initial encounter M11.261 (ICD-10-CM) - Chondrocalcinosis of right knee M25.532 (ICD-10-CM) - Arthralgia of left wrist S16.1XXA (ICD-10-CM) - Strain of neck muscle, initial encounter S06.0X0A (ICD-10-CM) - Concussion without loss of consciousness, initial encounter M19.012 (ICD-10-CM) - Primary osteoarthritis of left  shoulder  THERAPY DIAG:  Cervicalgia  Acute pain of right shoulder  Acute pain of left shoulder  Pain in right wrist  Pain in left wrist  Muscle spasm of back  Muscle weakness (generalized)  Rationale for Evaluation and Treatment: Rehabilitation  ONSET DATE: 03/23/2022  SUBJECTIVE:  SUBJECTIVE STATEMENT: Reports that the tape really helped her wrist pain and swelling, she slept a lot better last night.     [name] had a bad fall "it was a doozy" she fell forward and hit her head, had a concussion.  She laid out on the ground for about 20-30 min before neighbor heard her calling.  She has developed a stabbing pain in her back both sides just below shoulder blades.  She also hurt her neck, she has fluid in her L shoulder pressing on nerve endings , has a broken bone, has short term memory loss, had some from chemo, she also has a broken bone in L wrist, hairline fractures in L wrist, maybe fell a little harder on the left, but R shoulder also hurts, sprained her R wrist, but her knee is doing better not hurting as much.  They will hurt but nothing like upper body.  But my ankles, toes, and feet hurt but not as much as shoulders and neck.  Doctor told me it was like getting hit by linebacker.  I have blinding headaches that are worse in afternoon and at night.  I have a wonderful home with long driveway, went to mailbox, was reading mail when walking and tripped over a crack.  That's how I fell.  Concussion left her dizzy and blurred vision for a week and confused, but better now.   NEXT MD VISIT: 05/03/2023 with Dr. Raeford Razor  PERTINENT HISTORY:  History of breast cancer, short term memory loss, peripheral neuropathy, stent in heart, Afib, long term anticoagulation therapy,  history of CVA,  cervical spondylosis, migraines  PAIN:  Are you having pain? Yes: NPRS scale: 4/10 Pain location: neck L side and bil shoulders Pain description: sharp, shoot, burning, radiates Aggravating factors: moving Relieving factors: heat  Are you having pain? Yes: NPRS scale: 7-8/10 Pain location: L shoulder  Pain description: throbbing, aching Aggravating factors: increases as day progresses Relieving factors: heat, propping it up on pillows, wearing sling  Are you having pain? Yes: NPRS scale: 4/10 Pain location: L wrist, R wrist  5/10 Pain description: throbbing, sharp, shooting, burning Aggravating factors: using thumb Relieving factors: splints  Are you having pain? Yes: NPRS scale: 3/10 Pain location: upper back  L>R Pain description: aching, stabbing in evening Aggravating factors: increases in evening Relieving factors: heat  PRECAUTIONS: Fall  WEIGHT BEARING RESTRICTIONS: Yes no lifting or pushing with LUE  FALLS:  Has patient fallen in last 6 months? Yes. Number of falls 1  LIVING ENVIRONMENT: Lives with: lives alone Lives in: House/apartment Stairs:  lives downstairs, had full bedroom and bathroom, avoids going upstairs now. Has 1 step up from garage, no handrail, 3 steps at front door, has handrail.   Has following equipment at home: Single point cane, Walker - 2 wheeled, Environmental consultant - 4 wheeled, Manufacturing engineer  OCCUPATION: retired   PLOF: Independent  PATIENT GOALS: 1) get pain down better then 2) be more mobile, be able to cut food up again.   NEXT MD VISIT: 05/03/2023  OBJECTIVE:   DIAGNOSTIC FINDINGS:  03/23/22 R shoulder FINDINGS: Mild widening of the [facility] joint with relative elevation of the distal clavicle. This suggests ligamentous injury at the [facility] joint. No evidence of regional fracture. Humeral head is properly located. 03/23/22 L shoulder FINDINGS: There is no evidence of fracture or dislocation. There is no evidence of arthropathy or other focal bone  abnormality. Soft tissues are unremarkable. 03/23/22 CT Cervical Spine IMPRESSION: 1.  No acute intracranial process. 2. Atrophy with chronic microvascular ischemic changes. 3. No evidence of facial bone fracture. 4. Degenerative changes in the cervical spine without evidence of acute fracture. 03/23/22 DG Wrist R IMPRESSION: No acute or traumatic finding. Chronic degenerative changes at the first carpometacarpal joint and articulation of the multangular bones and scaphoid. 03/23/22 DG Wrist Left IMPRESSION: No acute or traumatic finding. Chronic degenerative changes at the first carpometacarpal joint. Chondrocalcinosis of the wrist joint.  04/19/22 Limited ultrasound: Left wrist pain/left shoulder pain:   Left wrist: Effusion noted within the carpal joints. There is an intense area of hyperemia at the distal radius within the joint.  There is no changes of the distal third of the radius. Degenerative changes of the scaphoid with no hyperemia. Degenerative changes of the [facility] joint.   Left shoulder: Effusion noted within the biceps tendon sheath. Chronic changes of the subscapularis. AC joint with degenerative changes. Chronic tendinopathy of the supraspinatus   Summary: Findings consistent with intra-articular distal radius fracture and effusion of the glenohumeral joint   Ultrasound and interpretation by Clearance Coots, MD  PATIENT SURVEYS:  NDI 39/50= 78% impairment Quick Dash 95.5% impairment  COGNITION: Overall cognitive status: History of cognitive impairments - at baseline and reports short term memory loss, increased confusion and memory difficulties following recent concussion  SENSATION: Bil neuropathy in feet from chemotherapy  POSTURE: rounded shoulders and forward head  PALPATION:    CERVICAL ROM:   Active ROM AROM (deg) eval  Flexion 15p!  Extension 10p!  Right lateral flexion   Left lateral flexion   Right rotation 20p!  Left rotation 25p!   (Blank rows  = not tested)  UPPER EXTREMITY ROM:  Active ROM Right eval Left eval  Shoulder flexion 80p! 60p!  Shoulder extension    Shoulder abduction 80p! 40p!  Shoulder adduction    Shoulder extension    Shoulder internal rotation Functional to R PSIS Unable to reach behind  Shoulder external rotation Functional to R ear/forehead Unable to lift arm   Elbow flexion    Elbow extension    Wrist flexion    Wrist extension    Wrist ulnar deviation    Wrist radial deviation    Wrist pronation    Wrist supination     (Blank rows = not tested)  UPPER EXTREMITY MMT:  MMT Right eval Left eval  Shoulder flexion    Shoulder extension    Shoulder abduction    Shoulder adduction    Shoulder extension    Shoulder internal rotation    Shoulder external rotation    Middle trapezius    Lower trapezius    Elbow flexion    Elbow extension    Wrist flexion    Wrist extension    Wrist ulnar deviation    Wrist radial deviation    Wrist pronation    Wrist supination    Grip strength     (Blank rows = not tested)   FUNCTIONAL TESTS:  5 times sit to stand: 30 seconds without UE assist  TODAY'S TREATMENT:  DATE:  05/13/22 Therapeutic Exercise: to improve strength and mobility.  Demo, verbal and tactile cues throughout for technique. TMR based rotational movements focusing on side of preference starting with upper trunk twist to Right x 10, neck rotation to L 2 x 10.  Manual Therapy: to decrease muscle spasm and pain and improve mobility STM/TPR to bil UT, L/S, cervical parspinals, skilled palpation and monitoring during dry needling. Trigger Point Dry-Needling  Treatment instructions: Expect mild to moderate muscle soreness. S/S of pneumothorax if dry needled over a lung field, and to seek immediate medical attention should they occur. Patient verbalized understanding of  these instructions and education. Patient Consent Given: Yes Education handout provided: Yes Muscles treated: L UT - 1 .25 x 40 mm Electrical stimulation performed: No Parameters: N/A Treatment response/outcome: Twitch Response Elicited and Palpable Increase in Muscle Length Kinesiotaping to L wrist using K-tape Gold, to decrease edema, lantern cut, one tape across L scaphoid, other L dorsal forearm.   05/10/22: Manual: Supine for gentle stretching, light cervical traction for 30 sec holds, interspersed with contract/ relax for upper cervical rotation B, 5 sec holds, 6 reps,with neck in flexed position, 2 sets.  Also contract /relax for L levator scapula 5 sec holds, 2 sets 5 reps.    Kinesiotaping, lantern cut, one I piece, extending from L dorsal forearm, to prox wrist, paper off, for edema management.   E stim:  IFC 4 pads, 2 post cervical paraspinals, 2 middle traps, with moist heat cervical spine and upper traps, in supine.   05/07/22 Therapeutic Exercise: to improve strength and mobility.  Demo, verbal and tactile cues throughout for technique.  Seated scap retraction and shoulder rolls 2x10  Education on proper posture Chin tucks Manual Therapy: STM to B UT, LS, cervical PS, suboccipitals more tight on L  E stim:  IFC 4 pads, 2 post cervical paraspinals, 2 middle traps, with moist heat cervical spine and upper traps, in supine. 15  04/29/22:Manual: Supine for light cross friction massage, myofascial release, B upper traps, cervical suboccipital musculature Therex:  the patient was instructed in isometrics for her cervical retraction, 10 sec holds, 5 rep intervals Also isometric B shoulder ext in supine to engage scapular retractors and postural stabilizers.  E stim:  IFC 4 pads, 2 post cervical paraspinals, 2 middle traps, with moist heat cervical spine and upper traps, in supine.  04/27/22 - see patient education.    PATIENT EDUCATION:  Education details: plan of care Person  educated: Patient Education method: Explanation Education comprehension: verbalized understanding  HOME EXERCISE PROGRAM: TBD  ASSESSMENT:  CLINICAL IMPRESSION: Daisy Larusso reported decreased pain and edema in L wrist following K-taping.  She did have small tear on dorsum of L wrist, uncertain if was from the K-tape, tape was peeling back so gently soaked and removed to avoid any trauma to skin, reapplied using K-tape gold avoiding damaged area, another small tape applied over thumb as well to assist with edema there.  She was able to perform TMR based exercises with good tolerance, reporting trigger point in L UT that worsens as day progresses, so after discussions of purposes and side effects of TrDN, did consent to trial of TrDN, inserted 1 needle into L UT trigger point and removed, noted good response with decreased tightness.  [name] reported decreased pain after interventions and improved ability to take deeper breathes.  Dionna Krautkramer continues to demonstrate potential for improvement and would benefit from continued skilled therapy to address impairments.  OBJECTIVE IMPAIRMENTS: decreased activity tolerance, decreased balance, decreased endurance, decreased mobility, difficulty walking, decreased ROM, decreased strength, decreased safety awareness, dizziness, increased fascial restrictions, increased muscle spasms, impaired sensation, impaired UE functional use, postural dysfunction, and pain.   ACTIVITY LIMITATIONS: carrying, lifting, bending, sitting, standing, squatting, sleeping, stairs, transfers, bed mobility, bathing, dressing, self feeding, reach over head, hygiene/grooming, and locomotion level  PARTICIPATION LIMITATIONS: meal prep, cleaning, laundry, medication management, driving, shopping, and community activity  PERSONAL FACTORS: Age, Time since onset of injury/illness/exacerbation, Transportation, and 3+ comorbidities: hx of migraines, history of cerebral infarction,  history of DVT, history of paroxysmal AF on chronic anticoagulation with warfarin (also taking Plavix), CAD (LAD stent 2021) history ANA positive, adjustment insomnia, chronic pain, history of malignant neoplasm left breast, zoster, B12 deficiency, vitamin D deficiency, osteoporosis  are also affecting patient's functional outcome.   REHAB POTENTIAL: Good  CLINICAL DECISION MAKING: Evolving/moderate complexity  EVALUATION COMPLEXITY: Moderate   GOALS: Goals reviewed with patient? Yes  SHORT TERM GOALS: Target date: 05/18/2022   Patient will be independent with initial HEP.  Baseline:  Goal status: IN PROGRESS   Patient will have balance evaluated with appropriate goal set if deficits.  Baseline:  Goal status: INITIAL   LONG TERM GOALS: Target date: 06/22/2022   Patient will be independent with advanced/ongoing HEP to improve outcomes and carryover.  Baseline:  Goal status: IN PROGRESS  2.  Patient will report 75% improvement in neck pain to improve QOL.  Baseline:  Goal status: IN PROGRESS  3.  Patient will demonstrate 15 deg improvement in cervical ROM without pain for safety with walking.    Baseline: see objective Goal status: IN PROGRESS  4.  Patient will report 15% improvement on NDI to demonstrate improved functional ability.  Baseline: 78% impairment Goal status: IN PROGRESS  5. Patient will report 75% improvement in bil shoulder pain to improve QOL.  Baseline: severe Goal status: IN PROGRESS  6.  Patient to improve bil shoulder AROM to [facility] without pain provocation to allow for increased ease of ADLs.  Baseline: see objective Goal status: IN PROGRESS  7.  Patient will report <50% impairment on QuickDash to demonstrate improved functional ability.  Baseline: 95.5% impairment Goal status: IN PROGRESS    PLAN:  PT FREQUENCY: 2x/week  PT DURATION: 8 weeks  PLANNED INTERVENTIONS: Therapeutic exercises, Therapeutic activity, Neuromuscular re-education,  Balance training, Gait training, Patient/Family education, Self Care, Joint mobilization, Stair training, Vestibular training, Visual/preceptual remediation/compensation, Dry Needling, Electrical stimulation, Spinal mobilization, Cryotherapy, Moist heat, Taping, Ultrasound, Ionotophoresis 4mg /ml Dexamethasone, Manual therapy, and Re-evaluation  PLAN FOR NEXT SESSION:   Gentle postural exercises, isometrics as tolerated.  Manual therapy, modalities PRN.  Need to assess balance as pain improves.      Rennie Natter, PT, DPT  05/13/2022, 12:13 PM

## 2022-05-18 ENCOUNTER — Ambulatory Visit: Payer: Medicare Other | Admitting: Occupational Therapy

## 2022-05-19 ENCOUNTER — Encounter: Payer: Self-pay | Admitting: Physical Therapy

## 2022-05-19 ENCOUNTER — Ambulatory Visit: Payer: Medicare Other | Admitting: Physical Therapy

## 2022-05-19 DIAGNOSIS — M6281 Muscle weakness (generalized): Secondary | ICD-10-CM

## 2022-05-19 DIAGNOSIS — M25532 Pain in left wrist: Secondary | ICD-10-CM

## 2022-05-19 DIAGNOSIS — M25511 Pain in right shoulder: Secondary | ICD-10-CM

## 2022-05-19 DIAGNOSIS — M542 Cervicalgia: Secondary | ICD-10-CM | POA: Diagnosis not present

## 2022-05-19 DIAGNOSIS — M25512 Pain in left shoulder: Secondary | ICD-10-CM

## 2022-05-19 DIAGNOSIS — M25531 Pain in right wrist: Secondary | ICD-10-CM

## 2022-05-19 DIAGNOSIS — M62830 Muscle spasm of back: Secondary | ICD-10-CM

## 2022-05-19 NOTE — Therapy (Signed)
OUTPATIENT PHYSICAL THERAPY CERVICAL TREATMENT   Patient Name: Monica Khan MRN: [id number] DOB:10/05/1945, 77 y.o., female Today's Date: 05/19/2022  END OF SESSION:  PT End of Session - 05/19/22 1017     Visit Number 6    Number of Visits 16    Date for PT Re-Evaluation 06/22/22    Authorization Type Medicare + BCBS    Progress Note Due on Visit 10    PT Start Time 1017    PT Stop Time 1100    PT Time Calculation (min) 43 min    Activity Tolerance Patient tolerated treatment well    Behavior During Therapy WFL for tasks assessed/performed                Past Medical History:  Diagnosis Date   Breast cancer    In remission   Fluttering heart    Gastritis    Migraines    Peripheral neuropathy    Shingles    Recurrent   Past Surgical History:  Procedure Laterality Date   ABDOMINAL HYSTERECTOMY     APPENDECTOMY     CHOLECYSTECTOMY     TOTAL MASTECTOMY     Patient Active Problem List   Diagnosis Date Noted   Wrist sprain, right, initial encounter 04/12/2022   Chondrocalcinosis of right knee 04/12/2022   Arthritis of carpometacarpal (CMC) joint of left thumb 04/12/2022   Cervical strain 04/12/2022   Concussion with no loss of consciousness 04/12/2022   Primary osteoarthritis of left shoulder 04/12/2022   Protein-calorie malnutrition, severe 09/20/2012   Hepatocellular injury 09/17/2012   Transaminitis 09/17/2012   Nausea and vomiting 09/17/2012   Shingles 09/17/2012   History of breast cancer 09/17/2012    PCP: Herma Carson, MD  REFERRING PROVIDER: Myra Rude, MD  REFERRING DIAG: [phone] (ICD-10-CM) - Wrist sprain, right, initial encounter M11.261 (ICD-10-CM) - Chondrocalcinosis of right knee M25.532 (ICD-10-CM) - Arthralgia of left wrist S16.1XXA (ICD-10-CM) - Strain of neck muscle, initial encounter S06.0X0A (ICD-10-CM) - Concussion without loss of consciousness, initial encounter M19.012 (ICD-10-CM) - Primary osteoarthritis of left  shoulder  THERAPY DIAG:  Cervicalgia  Acute pain of right shoulder  Acute pain of left shoulder  Pain in right wrist  Pain in left wrist  Muscle spasm of back  Muscle weakness (generalized)  Rationale for Evaluation and Treatment: Rehabilitation  ONSET DATE: 03/23/2022  SUBJECTIVE:  SUBJECTIVE STATEMENT: Missed OT appointment due to "throwing up migraine."  Hurting today because of the weather.  Unfortunately tape came off quickly.   L wrist is hurting, hand feels numb.     Monica Blade had a bad fall "it was a doozy" she fell forward and hit her head, had a concussion.  She laid out on the ground for about 20-30 min before neighbor heard her calling.  She has developed a stabbing pain in her back both sides just below shoulder blades.  She also hurt her neck, she has fluid in her L shoulder pressing on nerve endings , has a broken bone, has short term memory loss, had some from chemo, she also has a broken bone in L wrist, hairline fractures in L wrist, maybe fell a little harder on the left, but R shoulder also hurts, sprained her R wrist, but her knee is doing better not hurting as much.  They will hurt but nothing like upper body.  But my ankles, toes, and feet hurt but not as much as shoulders and neck.  Doctor told me it was like getting hit by linebacker.  I have blinding headaches that are worse in afternoon and at night.  I have a wonderful home with long driveway, went to mailbox, was reading mail when walking and tripped over a crack.  That's how I fell.  Concussion left her dizzy and blurred vision for a week and confused, but better now.   NEXT MD VISIT: 05/03/2023 with Dr. Jordan Likes  PERTINENT HISTORY:  History of breast cancer, short term memory loss, peripheral neuropathy,  stent in heart, Afib, long term anticoagulation therapy,  history of CVA, cervical spondylosis, migraines  PAIN:  Are you having pain? Yes: NPRS scale: 4/10 Pain location: neck L side and bil shoulders Pain description: sharp, shoot, burning, radiates Aggravating factors: moving Relieving factors: heat  Are you having pain? Yes: NPRS scale: 7-8/10 Pain location: L shoulder  Pain description: throbbing, aching Aggravating factors: increases as day progresses Relieving factors: heat, propping it up on pillows, wearing sling  Are you having pain? Yes: NPRS scale: 8/10 Pain location: L wrist, R wrist  5/10 Pain description: throbbing, sharp, shooting, burning Aggravating factors: using thumb Relieving factors: splints  Are you having pain? Yes: NPRS scale: 3/10 Pain location: upper back  L>R Pain description: aching, stabbing in evening Aggravating factors: increases in evening Relieving factors: heat  PRECAUTIONS: Fall  WEIGHT BEARING RESTRICTIONS: Yes no lifting or pushing with LUE  FALLS:  Has patient fallen in last 6 months? Yes. Number of falls 1  LIVING ENVIRONMENT: Lives with: lives alone Lives in: House/apartment Stairs:  lives downstairs, had full bedroom and bathroom, avoids going upstairs now. Has 1 step up from garage, no handrail, 3 steps at front door, has handrail.   Has following equipment at home: Single point cane, Walker - 2 wheeled, Environmental consultant - 4 wheeled, Marine scientist  OCCUPATION: retired   PLOF: Independent  PATIENT GOALS: 1) get pain down better then 2) be more mobile, be able to cut food up again.   NEXT MD VISIT: 05/03/2023  OBJECTIVE:   DIAGNOSTIC FINDINGS:  03/23/22 R shoulder FINDINGS: Mild widening of the [facility] joint with relative elevation of the distal clavicle. This suggests ligamentous injury at the [facility] joint. No evidence of regional fracture. Humeral head is properly located. 03/23/22 L shoulder FINDINGS: There is no evidence of fracture  or dislocation. There is no evidence of arthropathy or other focal  bone abnormality. Soft tissues are unremarkable. 03/23/22 CT Cervical Spine IMPRESSION: 1. No acute intracranial process. 2. Atrophy with chronic microvascular ischemic changes. 3. No evidence of facial bone fracture. 4. Degenerative changes in the cervical spine without evidence of acute fracture. 03/23/22 DG Wrist R IMPRESSION: No acute or traumatic finding. Chronic degenerative changes at the first carpometacarpal joint and articulation of the multangular bones and scaphoid. 03/23/22 DG Wrist Left IMPRESSION: No acute or traumatic finding. Chronic degenerative changes at the first carpometacarpal joint. Chondrocalcinosis of the wrist joint.  04/19/22 Limited ultrasound: Left wrist pain/left shoulder pain:   Left wrist: Effusion noted within the carpal joints. There is an intense area of hyperemia at the distal radius within the joint.  There is no changes of the distal third of the radius. Degenerative changes of the scaphoid with no hyperemia. Degenerative changes of the [facility] joint.   Left shoulder: Effusion noted within the biceps tendon sheath. Chronic changes of the subscapularis. AC joint with degenerative changes. Chronic tendinopathy of the supraspinatus   Summary: Findings consistent with intra-articular distal radius fracture and effusion of the glenohumeral joint   Ultrasound and interpretation by Clare Gandy, MD  PATIENT SURVEYS:  NDI 39/50= 78% impairment Quick Dash 95.5% impairment  COGNITION: Overall cognitive status: History of cognitive impairments - at baseline and reports short term memory loss, increased confusion and memory difficulties following recent concussion  SENSATION: Bil neuropathy in feet from chemotherapy  POSTURE: rounded shoulders and forward head  PALPATION:    CERVICAL ROM:   Active ROM AROM (deg) eval  Flexion 15p!  Extension 10p!  Right lateral flexion   Left  lateral flexion   Right rotation 20p!  Left rotation 25p!   (Blank rows = not tested)  UPPER EXTREMITY ROM:  Active ROM Right eval Left eval  Shoulder flexion 80p! 60p!  Shoulder extension    Shoulder abduction 80p! 40p!  Shoulder adduction    Shoulder extension    Shoulder internal rotation Functional to R PSIS Unable to reach behind  Shoulder external rotation Functional to R ear/forehead Unable to lift arm   Elbow flexion    Elbow extension    Wrist flexion    Wrist extension    Wrist ulnar deviation    Wrist radial deviation    Wrist pronation    Wrist supination     (Blank rows = not tested)  UPPER EXTREMITY MMT:  MMT Right eval Left eval  Shoulder flexion    Shoulder extension    Shoulder abduction    Shoulder adduction    Shoulder extension    Shoulder internal rotation    Shoulder external rotation    Middle trapezius    Lower trapezius    Elbow flexion    Elbow extension    Wrist flexion    Wrist extension    Wrist ulnar deviation    Wrist radial deviation    Wrist pronation    Wrist supination    Grip strength     (Blank rows = not tested)   FUNCTIONAL TESTS:  5 times sit to stand: 30 seconds without UE assist  TODAY'S TREATMENT:  DATE:   05/19/22 Therapeutic Exercise: to improve strength and mobility.  Demo, verbal and tactile cues throughout for technique. Review of HEP Manual Therapy: to decrease muscle spasm and pain and improve mobility STM/TPR to L UT, L/S, cervical paraspinals, gentle retrograde massage to improve lymphatic drainage in LUE, Kinesiotaping to L wrist using K-tape using 1 strip from L thumb to L lateral epiconyle, and x-strip to base of thumb to decrease pain.    Modalities: MHP to neck in sitting x 10 min (not included in treatment time)   05/13/22 Therapeutic Exercise: to improve strength and  mobility.  Demo, verbal and tactile cues throughout for technique. TMR based rotational movements focusing on side of preference starting with upper trunk twist to Right x 10, neck rotation to L 2 x 10.  Manual Therapy: to decrease muscle spasm and pain and improve mobility STM/TPR to bil UT, L/S, cervical parspinals, skilled palpation and monitoring during dry needling. Trigger Point Dry-Needling  Treatment instructions: Expect mild to moderate muscle soreness. S/S of pneumothorax if dry needled over a lung field, and to seek immediate medical attention should they occur. Patient verbalized understanding of these instructions and education. Patient Consent Given: Yes Education handout provided: Yes Muscles treated: L UT - 1 .25 x 40 mm Electrical stimulation performed: No Parameters: N/A Treatment response/outcome: Twitch Response Elicited and Palpable Increase in Muscle Length Kinesiotaping to L wrist using K-tape Gold, to decrease edema, lantern cut, one tape across L scaphoid, other L dorsal forearm.   05/10/22: Manual: Supine for gentle stretching, light cervical traction for 30 sec holds, interspersed with contract/ relax for upper cervical rotation B, 5 sec holds, 6 reps,with neck in flexed position, 2 sets.  Also contract /relax for L levator scapula 5 sec holds, 2 sets 5 reps.    Kinesiotaping, lantern cut, one I piece, extending from L dorsal forearm, to prox wrist, paper off, for edema management.   E stim:  IFC 4 pads, 2 post cervical paraspinals, 2 middle traps, with moist heat cervical spine and upper traps, in supine.    PATIENT EDUCATION:  Education details: plan of care Person educated: Patient Education method: Explanation Education comprehension: verbalized understanding  HOME EXERCISE PROGRAM: TBD  ASSESSMENT:  CLINICAL IMPRESSION: Monica Khan reports that K-tape gold peeled off the next day.  She is still having a lot of pain and swelling in her L wrist and pain  in upper back (levator scapula) but has been doing gentle stretches and AROM which is helping.  Focus of session on manual therapy to decrease pain and edema, reapplied regular K-tape in different pattern to provide more support over scaphoid/CMC joint and hopefully not peel back.  Monica Khan reported decreased pain after interventions.  Monica Khan continues to demonstrate potential for improvement and would benefit from continued skilled therapy to address impairments.     OBJECTIVE IMPAIRMENTS: decreased activity tolerance, decreased balance, decreased endurance, decreased mobility, difficulty walking, decreased ROM, decreased strength, decreased safety awareness, dizziness, increased fascial restrictions, increased muscle spasms, impaired sensation, impaired UE functional use, postural dysfunction, and pain.   ACTIVITY LIMITATIONS: carrying, lifting, bending, sitting, standing, squatting, sleeping, stairs, transfers, bed mobility, bathing, dressing, self feeding, reach over head, hygiene/grooming, and locomotion level  PARTICIPATION LIMITATIONS: meal prep, cleaning, laundry, medication management, driving, shopping, and community activity  PERSONAL FACTORS: Age, Time since onset of injury/illness/exacerbation, Transportation, and 3+ comorbidities: hx of migraines, history of cerebral infarction, history of DVT, history of paroxysmal AF on chronic anticoagulation with  warfarin (also taking Plavix), CAD (LAD stent 2021) history ANA positive, adjustment insomnia, chronic pain, history of malignant neoplasm left breast, zoster, B12 deficiency, vitamin D deficiency, osteoporosis  are also affecting patient's functional outcome.   REHAB POTENTIAL: Good  CLINICAL DECISION MAKING: Evolving/moderate complexity  EVALUATION COMPLEXITY: Moderate   GOALS: Goals reviewed with patient? Yes  SHORT TERM GOALS: Target date: 05/18/2022   Patient will be independent with initial HEP.  Baseline:  Goal  status: IN PROGRESS   Patient will have balance evaluated with appropriate goal set if deficits.  Baseline:  Goal status: INITIAL   LONG TERM GOALS: Target date: 06/22/2022   Patient will be independent with advanced/ongoing HEP to improve outcomes and carryover.  Baseline:  Goal status: IN PROGRESS  2.  Patient will report 75% improvement in neck pain to improve QOL.  Baseline:  Goal status: IN PROGRESS  3.  Patient will demonstrate 15 deg improvement in cervical ROM without pain for safety with walking.    Baseline: see objective Goal status: IN PROGRESS  4.  Patient will report 15% improvement on NDI to demonstrate improved functional ability.  Baseline: 78% impairment Goal status: IN PROGRESS  5. Patient will report 75% improvement in bil shoulder pain to improve QOL.  Baseline: severe Goal status: IN PROGRESS  6.  Patient to improve bil shoulder AROM to [facility] without pain provocation to allow for increased ease of ADLs.  Baseline: see objective Goal status: IN PROGRESS  7.  Patient will report <50% impairment on QuickDash to demonstrate improved functional ability.  Baseline: 95.5% impairment Goal status: IN PROGRESS    PLAN:  PT FREQUENCY: 2x/week  PT DURATION: 8 weeks  PLANNED INTERVENTIONS: Therapeutic exercises, Therapeutic activity, Neuromuscular re-education, Balance training, Gait training, Patient/Family education, Self Care, Joint mobilization, Stair training, Vestibular training, Visual/preceptual remediation/compensation, Dry Needling, Electrical stimulation, Spinal mobilization, Cryotherapy, Moist heat, Taping, Ultrasound, Ionotophoresis 4mg /ml Dexamethasone, Manual therapy, and Re-evaluation  PLAN FOR NEXT SESSION:   Gentle postural exercises, isometrics as tolerated.  Manual therapy, modalities PRN.  Need to assess balance as pain improves.      Jena Gauss, PT, DPT  05/19/2022, 12:16 PM

## 2022-05-24 ENCOUNTER — Encounter: Payer: Self-pay | Admitting: *Deleted

## 2022-05-26 ENCOUNTER — Encounter: Payer: Self-pay | Admitting: Physical Therapy

## 2022-05-26 ENCOUNTER — Ambulatory Visit: Payer: Medicare Other | Admitting: Physical Therapy

## 2022-05-26 DIAGNOSIS — M542 Cervicalgia: Secondary | ICD-10-CM

## 2022-05-26 DIAGNOSIS — M6281 Muscle weakness (generalized): Secondary | ICD-10-CM

## 2022-05-26 DIAGNOSIS — M25511 Pain in right shoulder: Secondary | ICD-10-CM

## 2022-05-26 DIAGNOSIS — M25512 Pain in left shoulder: Secondary | ICD-10-CM

## 2022-05-26 DIAGNOSIS — M25531 Pain in right wrist: Secondary | ICD-10-CM

## 2022-05-26 DIAGNOSIS — M25532 Pain in left wrist: Secondary | ICD-10-CM

## 2022-05-26 DIAGNOSIS — M62830 Muscle spasm of back: Secondary | ICD-10-CM

## 2022-05-26 NOTE — Therapy (Signed)
OUTPATIENT PHYSICAL THERAPY CERVICAL TREATMENT   Patient Name: Monica Khan MRN: [id number] DOB:[birth year], 77 y.o., female Today's Date: 05/26/2022  END OF SESSION:  PT End of Session - 05/26/22 1029     Visit Number 7    Number of Visits 16    Date for PT Re-Evaluation 06/22/22    Authorization Type Medicare + BCBS    Progress Note Due on Visit 10    PT Start Time 1019    PT Stop Time 1103    PT Time Calculation (min) 44 min    Activity Tolerance Patient tolerated treatment well    Behavior During Therapy WFL for tasks assessed/performed                Past Medical History:  Diagnosis Date   Breast cancer    In remission   Fluttering heart    Gastritis    Migraines    Peripheral neuropathy    Shingles    Recurrent   Past Surgical History:  Procedure Laterality Date   ABDOMINAL HYSTERECTOMY     APPENDECTOMY     CHOLECYSTECTOMY     TOTAL MASTECTOMY     Patient Active Problem List   Diagnosis Date Noted   Wrist sprain, right, initial encounter 04/12/2022   Chondrocalcinosis of right knee 04/12/2022   Arthritis of carpometacarpal (CMC) joint of left thumb 04/12/2022   Cervical strain 04/12/2022   Concussion with no loss of consciousness 04/12/2022   Primary osteoarthritis of left shoulder 04/12/2022   Protein-calorie malnutrition, severe 09/20/2012   Hepatocellular injury 09/17/2012   Transaminitis 09/17/2012   Nausea and vomiting 09/17/2012   Shingles 09/17/2012   History of breast cancer 09/17/2012    PCP: Herma Carson, MD  REFERRING PROVIDER: Myra Rude, MD  REFERRING DIAG: [phone] (ICD-10-CM) - Wrist sprain, right, initial encounter M11.261 (ICD-10-CM) - Chondrocalcinosis of right knee M25.532 (ICD-10-CM) - Arthralgia of left wrist S16.1XXA (ICD-10-CM) - Strain of neck muscle, initial encounter S06.0X0A (ICD-10-CM) - Concussion without loss of consciousness, initial encounter M19.012 (ICD-10-CM) - Primary osteoarthritis of left  shoulder  THERAPY DIAG:  Cervicalgia  Acute pain of right shoulder  Acute pain of left shoulder  Pain in right wrist  Pain in left wrist  Muscle spasm of back  Muscle weakness (generalized)  Rationale for Evaluation and Treatment: Rehabilitation  ONSET DATE: 03/23/2022  SUBJECTIVE:  SUBJECTIVE STATEMENT: Able to turn her head better now with the stretches, so she tried driving a couple of days ago to the store, afterwards her L arm really swelled up again.  The tape lasted 3 days this time which was good.  Cried this morning but got up and came in.     [name] had a bad fall "it was a doozy" she fell forward and hit her head, had a concussion.  She laid out on the ground for about 20-30 min before neighbor heard her calling.  She has developed a stabbing pain in her back both sides just below shoulder blades.  She also hurt her neck, she has fluid in her L shoulder pressing on nerve endings , has a broken bone, has short term memory loss, had some from chemo, she also has a broken bone in L wrist, hairline fractures in L wrist, maybe fell a little harder on the left, but R shoulder also hurts, sprained her R wrist, but her knee is doing better not hurting as much.  They will hurt but nothing like upper body.  But my ankles, toes, and feet hurt but not as much as shoulders and neck.  Doctor told me it was like getting hit by linebacker.  I have blinding headaches that are worse in afternoon and at night.  I have a wonderful home with long driveway, went to mailbox, was reading mail when walking and tripped over a crack.  That's how I fell.  Concussion left her dizzy and blurred vision for a week and confused, but better now.   NEXT MD VISIT: 05/03/2023 with Dr. Jordan Likes  PERTINENT HISTORY:   History of breast cancer, short term memory loss, peripheral neuropathy, stent in heart, Afib, long term anticoagulation therapy,  history of CVA, cervical spondylosis, migraines  PAIN:  Are you having pain? Yes: NPRS scale: 7-8/10 Pain location: L wrist Pain description: sharp, shoot, burning, radiates Aggravating factors: moving Relieving factors: heat   PRECAUTIONS: Fall  WEIGHT BEARING RESTRICTIONS: Yes no lifting or pushing with LUE  FALLS:  Has patient fallen in last 6 months? Yes. Number of falls 1  LIVING ENVIRONMENT: Lives with: lives alone Lives in: House/apartment Stairs:  lives downstairs, had full bedroom and bathroom, avoids going upstairs now. Has 1 step up from garage, no handrail, 3 steps at front door, has handrail.   Has following equipment at home: Single point cane, Walker - 2 wheeled, Environmental consultant - 4 wheeled, Marine scientist  OCCUPATION: retired   PLOF: Independent  PATIENT GOALS: 1) get pain down better then 2) be more mobile, be able to cut food up again.   NEXT MD VISIT: 05/03/2023  OBJECTIVE:   DIAGNOSTIC FINDINGS:  03/23/22 R shoulder FINDINGS: Mild widening of the [facility] joint with relative elevation of the distal clavicle. This suggests ligamentous injury at the [facility] joint. No evidence of regional fracture. Humeral head is properly located. 03/23/22 L shoulder FINDINGS: There is no evidence of fracture or dislocation. There is no evidence of arthropathy or other focal bone abnormality. Soft tissues are unremarkable. 03/23/22 CT Cervical Spine IMPRESSION: 1. No acute intracranial process. 2. Atrophy with chronic microvascular ischemic changes. 3. No evidence of facial bone fracture. 4. Degenerative changes in the cervical spine without evidence of acute fracture. 03/23/22 DG Wrist R IMPRESSION: No acute or traumatic finding. Chronic degenerative changes at the first carpometacarpal joint and articulation of the multangular bones and scaphoid. 03/23/22 DG  Wrist Left IMPRESSION: No acute  or traumatic finding. Chronic degenerative changes at the first carpometacarpal joint. Chondrocalcinosis of the wrist joint.  04/19/22 Limited ultrasound: Left wrist pain/left shoulder pain:   Left wrist: Effusion noted within the carpal joints. There is an intense area of hyperemia at the distal radius within the joint.  There is no changes of the distal third of the radius. Degenerative changes of the scaphoid with no hyperemia. Degenerative changes of the [facility] joint.   Left shoulder: Effusion noted within the biceps tendon sheath. Chronic changes of the subscapularis. AC joint with degenerative changes. Chronic tendinopathy of the supraspinatus   Summary: Findings consistent with intra-articular distal radius fracture and effusion of the glenohumeral joint   Ultrasound and interpretation by Clare Gandy, MD  PATIENT SURVEYS:  NDI 39/50= 78% impairment Quick Dash 95.5% impairment  COGNITION: Overall cognitive status: History of cognitive impairments - at baseline and reports short term memory loss, increased confusion and memory difficulties following recent concussion  SENSATION: Bil neuropathy in feet from chemotherapy  POSTURE: rounded shoulders and forward head  PALPATION:    CERVICAL ROM:   Active ROM AROM (deg) eval  Flexion 15p!  Extension 10p!  Right lateral flexion   Left lateral flexion   Right rotation 20p!  Left rotation 25p!   (Blank rows = not tested)  UPPER EXTREMITY ROM:  Active ROM Right eval Left eval  Shoulder flexion 80p! 60p!  Shoulder extension    Shoulder abduction 80p! 40p!  Shoulder adduction    Shoulder extension    Shoulder internal rotation Functional to R PSIS Unable to reach behind  Shoulder external rotation Functional to R ear/forehead Unable to lift arm   Elbow flexion    Elbow extension    Wrist flexion    Wrist extension    Wrist ulnar deviation    Wrist radial deviation    Wrist  pronation    Wrist supination     (Blank rows = not tested)  UPPER EXTREMITY MMT:  MMT Right eval Left eval  Shoulder flexion    Shoulder extension    Shoulder abduction    Shoulder adduction    Shoulder extension    Shoulder internal rotation    Shoulder external rotation    Middle trapezius    Lower trapezius    Elbow flexion    Elbow extension    Wrist flexion    Wrist extension    Wrist ulnar deviation    Wrist radial deviation    Wrist pronation    Wrist supination    Grip strength     (Blank rows = not tested)   FUNCTIONAL TESTS:  5 times sit to stand: 30 seconds without UE assist  TODAY'S TREATMENT:                                                                                                                              DATE:   05/26/22 Therapeutic Exercise: to improve strength and mobility.  Demo, verbal and tactile cues throughout for technique. Table slides flexion x 10, scaption x 10 Gentle cervical AROM and levator stretch Manual Therapy: to decrease muscle spasm and pain and improve mobility STM/TPR to L UT, L/S, cervical paraspinals, Kinesiotaping to L wrist using K-tape using 1 strip from L thumb to L lateral epiconyle, strip across wrist to anchor, and another anchoring at epicondyle.    Modalities: MHP to neck in sitting x 10 min (not included in treatment time)  05/19/22 Therapeutic Exercise: to improve strength and mobility.  Demo, verbal and tactile cues throughout for technique. Review of HEP Manual Therapy: to decrease muscle spasm and pain and improve mobility STM/TPR to L UT, L/S, cervical paraspinals, gentle retrograde massage to improve lymphatic drainage in LUE, Kinesiotaping to L wrist using K-tape using 1 strip from L thumb to L lateral epiconyle, and x-strip to base of thumb to decrease pain.    Modalities: MHP to neck in sitting x 10 min (not included in treatment time)   05/13/22 Therapeutic Exercise: to improve strength and mobility.   Demo, verbal and tactile cues throughout for technique. TMR based rotational movements focusing on side of preference starting with upper trunk twist to Right x 10, neck rotation to L 2 x 10.  Manual Therapy: to decrease muscle spasm and pain and improve mobility STM/TPR to bil UT, L/S, cervical parspinals, skilled palpation and monitoring during dry needling. Trigger Point Dry-Needling  Treatment instructions: Expect mild to moderate muscle soreness. S/S of pneumothorax if dry needled over a lung field, and to seek immediate medical attention should they occur. Patient verbalized understanding of these instructions and education. Patient Consent Given: Yes Education handout provided: Yes Muscles treated: L UT - 1 .25 x 40 mm Electrical stimulation performed: No Parameters: N/A Treatment response/outcome: Twitch Response Elicited and Palpable Increase in Muscle Length Kinesiotaping to L wrist using K-tape Gold, to decrease edema, lantern cut, one tape across L scaphoid, other L dorsal forearm.    PATIENT EDUCATION:  Education details: HEP update Person educated: Patient Education method: Explanation, Demonstration, Verbal cues, and Handouts Education comprehension: verbalized understanding and returned demonstration  HOME EXERCISE PROGRAM: Access Code: [email or web address] URL: [email or web address] Date: 05/26/2022 Prepared by: Harrie Foreman  Exercises - Seated Shoulder Flexion Towel Slide at Table Top  - 2 x daily - 7 x weekly - 1 sets - 10 reps - Seated Shoulder Scaption Slide at Table Top with Forearm in Neutral  - 2 x daily - 7 x weekly - 1 sets - 10 reps - Gentle Levator Scapulae Stretch  - 2 x daily - 7 x weekly - 1 sets - 3 reps - 10 sec  hold - Seated Cervical Rotation AROM  - 2 x daily - 7 x weekly - 1 sets - 10 reps  ASSESSMENT:  CLINICAL IMPRESSION: Raeden Belzer continues to report pain now primarily in L thumb and wrist, pain in R is improving significantly and  can lift a carton of milk now.  She did go for a drive as cervical AROM is also improving, but has had increased swelling in LUE since.  Discussed may need to return to lymphedema specialist, as she no longer recalls how to do lymphatic massage and this is the arm she has a history of lymphedema in.  She also needs to reschedule her OT evaluation.  She tried to do some hand squeezes at home for strengthening but these aggravated her L thumb pain, recommended waiting until OT could assess.  Focused today on manual therapy to neck and again taped L forearm from thumb up to just above distal lateral elbow.  HEP updated for gentle table slides which she tolerated well today.   Lazaria Schaben continues to demonstrate potential for improvement and would benefit from continued skilled therapy to address impairments.     OBJECTIVE IMPAIRMENTS: decreased activity tolerance, decreased balance, decreased endurance, decreased mobility, difficulty walking, decreased ROM, decreased strength, decreased safety awareness, dizziness, increased fascial restrictions, increased muscle spasms, impaired sensation, impaired UE functional use, postural dysfunction, and pain.   ACTIVITY LIMITATIONS: carrying, lifting, bending, sitting, standing, squatting, sleeping, stairs, transfers, bed mobility, bathing, dressing, self feeding, reach over head, hygiene/grooming, and locomotion level  PARTICIPATION LIMITATIONS: meal prep, cleaning, laundry, medication management, driving, shopping, and community activity  PERSONAL FACTORS: Age, Time since onset of injury/illness/exacerbation, Transportation, and 3+ comorbidities: hx of migraines, history of cerebral infarction, history of DVT, history of paroxysmal AF on chronic anticoagulation with warfarin (also taking Plavix), CAD (LAD stent 2021) history ANA positive, adjustment insomnia, chronic pain, history of malignant neoplasm left breast, zoster, B12 deficiency, vitamin D deficiency,  osteoporosis  are also affecting patient's functional outcome.   REHAB POTENTIAL: Good  CLINICAL DECISION MAKING: Evolving/moderate complexity  EVALUATION COMPLEXITY: Moderate   GOALS: Goals reviewed with patient? Yes  SHORT TERM GOALS: Target date: 05/18/2022   Patient will be independent with initial HEP.  Baseline:  Goal status: MET 05/26/22   Patient will have balance evaluated with appropriate goal set if deficits.  Baseline:  Goal status: Deferred   LONG TERM GOALS: Target date: 06/22/2022   Patient will be independent with advanced/ongoing HEP to improve outcomes and carryover.  Baseline:  Goal status: IN PROGRESS  2.  Patient will report 75% improvement in neck pain to improve QOL.  Baseline:  Goal status: IN PROGRESS  3.  Patient will demonstrate 15 deg improvement in cervical ROM without pain for safety with walking.    Baseline: see objective Goal status: IN PROGRESS  4.  Patient will report 15% improvement on NDI to demonstrate improved functional ability.  Baseline: 78% impairment Goal status: IN PROGRESS  5. Patient will report 75% improvement in bil shoulder pain to improve QOL.  Baseline: severe Goal status: IN PROGRESS  6.  Patient to improve bil shoulder AROM to [facility] without pain provocation to allow for increased ease of ADLs.  Baseline: see objective Goal status: IN PROGRESS  7.  Patient will report <50% impairment on QuickDash to demonstrate improved functional ability.  Baseline: 95.5% impairment Goal status: IN PROGRESS    PLAN:  PT FREQUENCY: 2x/week  PT DURATION: 8 weeks  PLANNED INTERVENTIONS: Therapeutic exercises, Therapeutic activity, Neuromuscular re-education, Balance training, Gait training, Patient/Family education, Self Care, Joint mobilization, Stair training, Vestibular training, Visual/preceptual remediation/compensation, Dry Needling, Electrical stimulation, Spinal mobilization, Cryotherapy, Moist heat, Taping, Ultrasound,  Ionotophoresis 4mg /ml Dexamethasone, Manual therapy, and Re-evaluation  PLAN FOR NEXT SESSION:   Gentle postural exercises, isometrics as tolerated.  Manual therapy, modalities PRN.  Need to assess balance as pain improves.    May need referral to lymphadema specialist.  Needs to r/s OT evaluation.    Jena Gauss, PT, DPT  05/26/2022, 12:07 PM

## 2022-06-01 ENCOUNTER — Ambulatory Visit: Payer: Medicare Other

## 2022-06-01 DIAGNOSIS — M542 Cervicalgia: Secondary | ICD-10-CM | POA: Diagnosis not present

## 2022-06-01 DIAGNOSIS — M62830 Muscle spasm of back: Secondary | ICD-10-CM

## 2022-06-01 DIAGNOSIS — M6281 Muscle weakness (generalized): Secondary | ICD-10-CM

## 2022-06-01 DIAGNOSIS — M25531 Pain in right wrist: Secondary | ICD-10-CM

## 2022-06-01 DIAGNOSIS — M25511 Pain in right shoulder: Secondary | ICD-10-CM

## 2022-06-01 DIAGNOSIS — M25512 Pain in left shoulder: Secondary | ICD-10-CM

## 2022-06-01 DIAGNOSIS — M25532 Pain in left wrist: Secondary | ICD-10-CM

## 2022-06-01 NOTE — Therapy (Signed)
OUTPATIENT PHYSICAL THERAPY CERVICAL TREATMENT   Patient Name: Monica Khan MRN: [id number] DOB:Jul 05, 1945, 77 y.o., female Today's Date: 06/01/2022  END OF SESSION:  PT End of Session - 06/01/22 1100     Visit Number 8    Number of Visits 16    Date for PT Re-Evaluation 06/22/22    Authorization Type Medicare + BCBS    Progress Note Due on Visit 10    PT Start Time 1018    PT Stop Time 1100    PT Time Calculation (min) 42 min    Activity Tolerance Patient tolerated treatment well    Behavior During Therapy WFL for tasks assessed/performed                 Past Medical History:  Diagnosis Date   Breast cancer    In remission   Fluttering heart    Gastritis    Migraines    Peripheral neuropathy    Shingles    Recurrent   Past Surgical History:  Procedure Laterality Date   ABDOMINAL HYSTERECTOMY     APPENDECTOMY     CHOLECYSTECTOMY     TOTAL MASTECTOMY     Patient Active Problem List   Diagnosis Date Noted   Wrist sprain, right, initial encounter 04/12/2022   Chondrocalcinosis of right knee 04/12/2022   Arthritis of carpometacarpal (CMC) joint of left thumb 04/12/2022   Cervical strain 04/12/2022   Concussion with no loss of consciousness 04/12/2022   Primary osteoarthritis of left shoulder 04/12/2022   Protein-calorie malnutrition, severe 09/20/2012   Hepatocellular injury 09/17/2012   Transaminitis 09/17/2012   Nausea and vomiting 09/17/2012   Shingles 09/17/2012   History of breast cancer 09/17/2012    PCP: Herma Carson, MD  REFERRING PROVIDER: Myra Rude, MD  REFERRING DIAG: [phone] (ICD-10-CM) - Wrist sprain, right, initial encounter M11.261 (ICD-10-CM) - Chondrocalcinosis of right knee M25.532 (ICD-10-CM) - Arthralgia of left wrist S16.1XXA (ICD-10-CM) - Strain of neck muscle, initial encounter S06.0X0A (ICD-10-CM) - Concussion without loss of consciousness, initial encounter M19.012 (ICD-10-CM) - Primary osteoarthritis of left  shoulder  THERAPY DIAG:  Cervicalgia  Acute pain of right shoulder  Acute pain of left shoulder  Pain in right wrist  Pain in left wrist  Muscle spasm of back  Muscle weakness (generalized)  Rationale for Evaluation and Treatment: Rehabilitation  ONSET DATE: 03/23/2022  SUBJECTIVE:  SUBJECTIVE STATEMENT: Pt reports having wrist     [name] had a bad fall "it was a doozy" she fell forward and hit her head, had a concussion.  She laid out on the ground for about 20-30 min before neighbor heard her calling.  She has developed a stabbing pain in her back both sides just below shoulder blades.  She also hurt her neck, she has fluid in her L shoulder pressing on nerve endings , has a broken bone, has short term memory loss, had some from chemo, she also has a broken bone in L wrist, hairline fractures in L wrist, maybe fell a little harder on the left, but R shoulder also hurts, sprained her R wrist, but her knee is doing better not hurting as much.  They will hurt but nothing like upper body.  But my ankles, toes, and feet hurt but not as much as shoulders and neck.  Doctor told me it was like getting hit by linebacker.  I have blinding headaches that are worse in afternoon and at night.  I have a wonderful home with long driveway, went to mailbox, was reading mail when walking and tripped over a crack.  That's how I fell.  Concussion left her dizzy and blurred vision for a week and confused, but better now.   NEXT MD VISIT: 05/03/2023 with Dr. Jordan Likes  PERTINENT HISTORY:  History of breast cancer, short term memory loss, peripheral neuropathy, stent in heart, Afib, long term anticoagulation therapy,  history of CVA, cervical spondylosis, migraines  PAIN:  Are you having pain? Yes: NPRS  scale: 6/10 Pain location: L wrist and forearm Pain description: sharp, shoot, burning, radiates Aggravating factors: moving Relieving factors: heat   PRECAUTIONS: Fall  WEIGHT BEARING RESTRICTIONS: Yes no lifting or pushing with LUE  FALLS:  Has patient fallen in last 6 months? Yes. Number of falls 1  LIVING ENVIRONMENT: Lives with: lives alone Lives in: House/apartment Stairs:  lives downstairs, had full bedroom and bathroom, avoids going upstairs now. Has 1 step up from garage, no handrail, 3 steps at front door, has handrail.   Has following equipment at home: Single point cane, Walker - 2 wheeled, Environmental consultant - 4 wheeled, Marine scientist  OCCUPATION: retired   PLOF: Independent  PATIENT GOALS: 1) get pain down better then 2) be more mobile, be able to cut food up again.   NEXT MD VISIT: 05/03/2023  OBJECTIVE:   DIAGNOSTIC FINDINGS:  03/23/22 R shoulder FINDINGS: Mild widening of the [facility] joint with relative elevation of the distal clavicle. This suggests ligamentous injury at the [facility] joint. No evidence of regional fracture. Humeral head is properly located. 03/23/22 L shoulder FINDINGS: There is no evidence of fracture or dislocation. There is no evidence of arthropathy or other focal bone abnormality. Soft tissues are unremarkable. 03/23/22 CT Cervical Spine IMPRESSION: 1. No acute intracranial process. 2. Atrophy with chronic microvascular ischemic changes. 3. No evidence of facial bone fracture. 4. Degenerative changes in the cervical spine without evidence of acute fracture. 03/23/22 DG Wrist R IMPRESSION: No acute or traumatic finding. Chronic degenerative changes at the first carpometacarpal joint and articulation of the multangular bones and scaphoid. 03/23/22 DG Wrist Left IMPRESSION: No acute or traumatic finding. Chronic degenerative changes at the first carpometacarpal joint. Chondrocalcinosis of the wrist joint.  04/19/22 Limited ultrasound: Left wrist pain/left  shoulder pain:   Left wrist: Effusion noted within the carpal joints. There is an intense area of hyperemia at the distal  radius within the joint.  There is no changes of the distal third of the radius. Degenerative changes of the scaphoid with no hyperemia. Degenerative changes of the [facility] joint.   Left shoulder: Effusion noted within the biceps tendon sheath. Chronic changes of the subscapularis. AC joint with degenerative changes. Chronic tendinopathy of the supraspinatus   Summary: Findings consistent with intra-articular distal radius fracture and effusion of the glenohumeral joint   Ultrasound and interpretation by Clare Gandy, MD  PATIENT SURVEYS:  NDI 39/50= 78% impairment Quick Dash 95.5% impairment  COGNITION: Overall cognitive status: History of cognitive impairments - at baseline and reports short term memory loss, increased confusion and memory difficulties following recent concussion  SENSATION: Bil neuropathy in feet from chemotherapy  POSTURE: rounded shoulders and forward head  PALPATION:    CERVICAL ROM:   Active ROM AROM (deg) eval  Flexion 15p!  Extension 10p!  Right lateral flexion   Left lateral flexion   Right rotation 20p!  Left rotation 25p!   (Blank rows = not tested)  UPPER EXTREMITY ROM:  Active ROM Right eval Left eval  Shoulder flexion 80p! 60p!  Shoulder extension    Shoulder abduction 80p! 40p!  Shoulder adduction    Shoulder extension    Shoulder internal rotation Functional to R PSIS Unable to reach behind  Shoulder external rotation Functional to R ear/forehead Unable to lift arm   Elbow flexion    Elbow extension    Wrist flexion    Wrist extension    Wrist ulnar deviation    Wrist radial deviation    Wrist pronation    Wrist supination     (Blank rows = not tested)  UPPER EXTREMITY MMT:  MMT Right eval Left eval  Shoulder flexion    Shoulder extension    Shoulder abduction    Shoulder adduction     Shoulder extension    Shoulder internal rotation    Shoulder external rotation    Middle trapezius    Lower trapezius    Elbow flexion    Elbow extension    Wrist flexion    Wrist extension    Wrist ulnar deviation    Wrist radial deviation    Wrist pronation    Wrist supination    Grip strength     (Blank rows = not tested)   FUNCTIONAL TESTS:  5 times sit to stand: 30 seconds without UE assist  TODAY'S TREATMENT:                                                                                                                              DATE:  06/01/22 Therapeutic Exercise: to improve strength and mobility.  Demo, verbal and tactile cues throughout for technique. Table slides flexion and adduction x 10  Gentle cervical rotation x 10  Manual Therapy: to decrease muscle spasm and pain and improve mobility STM/TPR to L UT, L/S, cervical paraspinals   05/26/22 Therapeutic Exercise:  to improve strength and mobility.  Demo, verbal and tactile cues throughout for technique. Table slides flexion x 10, scaption x 10 Gentle cervical AROM and levator stretch Manual Therapy: to decrease muscle spasm and pain and improve mobility STM/TPR to L UT, L/S, cervical paraspinals, Kinesiotaping to L wrist using K-tape using 1 strip from L thumb to L lateral epiconyle, strip across wrist to anchor, and another anchoring at epicondyle.    Modalities: MHP to neck in sitting x 10 min (not included in treatment time)  05/19/22 Therapeutic Exercise: to improve strength and mobility.  Demo, verbal and tactile cues throughout for technique. Review of HEP Manual Therapy: to decrease muscle spasm and pain and improve mobility STM/TPR to L UT, L/S, cervical paraspinals, gentle retrograde massage to improve lymphatic drainage in LUE, Kinesiotaping to L wrist using K-tape using 1 strip from L thumb to L lateral epiconyle, and x-strip to base of thumb to decrease pain.    Modalities: MHP to neck in sitting x 10  min (not included in treatment time)   05/13/22 Therapeutic Exercise: to improve strength and mobility.  Demo, verbal and tactile cues throughout for technique. TMR based rotational movements focusing on side of preference starting with upper trunk twist to Right x 10, neck rotation to L 2 x 10.  Manual Therapy: to decrease muscle spasm and pain and improve mobility STM/TPR to bil UT, L/S, cervical parspinals, skilled palpation and monitoring during dry needling. Trigger Point Dry-Needling  Treatment instructions: Expect mild to moderate muscle soreness. S/S of pneumothorax if dry needled over a lung field, and to seek immediate medical attention should they occur. Patient verbalized understanding of these instructions and education. Patient Consent Given: Yes Education handout provided: Yes Muscles treated: L UT - 1 .25 x 40 mm Electrical stimulation performed: No Parameters: N/A Treatment response/outcome: Twitch Response Elicited and Palpable Increase in Muscle Length Kinesiotaping to L wrist using K-tape Gold, to decrease edema, lantern cut, one tape across L scaphoid, other L dorsal forearm.    PATIENT EDUCATION:  Education details: HEP update Person educated: Patient Education method: Explanation, Demonstration, Verbal cues, and Handouts Education comprehension: verbalized understanding and returned demonstration  HOME EXERCISE PROGRAM: Access Code: [email or web address] URL: [email or web address] Date: 05/26/2022 Prepared by: Harrie Foreman  Exercises - Seated Shoulder Flexion Towel Slide at Table Top  - 2 x daily - 7 x weekly - 1 sets - 10 reps - Seated Shoulder Scaption Slide at Table Top with Forearm in Neutral  - 2 x daily - 7 x weekly - 1 sets - 10 reps - Gentle Levator Scapulae Stretch  - 2 x daily - 7 x weekly - 1 sets - 3 reps - 10 sec  hold - Seated Cervical Rotation AROM  - 2 x daily - 7 x weekly - 1 sets - 10 reps  ASSESSMENT:  CLINICAL IMPRESSION: Wavie Hashimi continues having complaints of pain in her L wrist, shoulder, neck, and ankle. Patient continues to be focused on pain mostly in L wrist and forearm. Focus of session was gentle exercises and STM to decrease muscle tension and pain in her neck. She needs reinforcement to avoid painful ROM with exercises. We did not do wrist exercises d/t pain. She declined Kt tape today.    OBJECTIVE IMPAIRMENTS: decreased activity tolerance, decreased balance, decreased endurance, decreased mobility, difficulty walking, decreased ROM, decreased strength, decreased safety awareness, dizziness, increased fascial restrictions, increased muscle spasms, impaired sensation, impaired UE functional use, postural dysfunction, and pain.  ACTIVITY LIMITATIONS: carrying, lifting, bending, sitting, standing, squatting, sleeping, stairs, transfers, bed mobility, bathing, dressing, self feeding, reach over head, hygiene/grooming, and locomotion level  PARTICIPATION LIMITATIONS: meal prep, cleaning, laundry, medication management, driving, shopping, and community activity  PERSONAL FACTORS: Age, Time since onset of injury/illness/exacerbation, Transportation, and 3+ comorbidities: hx of migraines, history of cerebral infarction, history of DVT, history of paroxysmal AF on chronic anticoagulation with warfarin (also taking Plavix), CAD (LAD stent 2021) history ANA positive, adjustment insomnia, chronic pain, history of malignant neoplasm left breast, zoster, B12 deficiency, vitamin D deficiency, osteoporosis  are also affecting patient's functional outcome.   REHAB POTENTIAL: Good  CLINICAL DECISION MAKING: Evolving/moderate complexity  EVALUATION COMPLEXITY: Moderate   GOALS: Goals reviewed with patient? Yes  SHORT TERM GOALS: Target date: 05/18/2022   Patient will be independent with initial HEP.  Baseline:  Goal status: MET 05/26/22   Patient will have balance evaluated with appropriate goal set if deficits.   Baseline:  Goal status: Deferred   LONG TERM GOALS: Target date: 06/22/2022   Patient will be independent with advanced/ongoing HEP to improve outcomes and carryover.  Baseline:  Goal status: IN PROGRESS  2.  Patient will report 75% improvement in neck pain to improve QOL.  Baseline:  Goal status: IN PROGRESS  3.  Patient will demonstrate 15 deg improvement in cervical ROM without pain for safety with walking.    Baseline: see objective Goal status: IN PROGRESS  4.  Patient will report 15% improvement on NDI to demonstrate improved functional ability.  Baseline: 78% impairment Goal status: IN PROGRESS  5. Patient will report 75% improvement in bil shoulder pain to improve QOL.  Baseline: severe Goal status: IN PROGRESS  6.  Patient to improve bil shoulder AROM to [facility] without pain provocation to allow for increased ease of ADLs.  Baseline: see objective Goal status: IN PROGRESS  7.  Patient will report <50% impairment on QuickDash to demonstrate improved functional ability.  Baseline: 95.5% impairment Goal status: IN PROGRESS    PLAN:  PT FREQUENCY: 2x/week  PT DURATION: 8 weeks  PLANNED INTERVENTIONS: Therapeutic exercises, Therapeutic activity, Neuromuscular re-education, Balance training, Gait training, Patient/Family education, Self Care, Joint mobilization, Stair training, Vestibular training, Visual/preceptual remediation/compensation, Dry Needling, Electrical stimulation, Spinal mobilization, Cryotherapy, Moist heat, Taping, Ultrasound, Ionotophoresis 4mg /ml Dexamethasone, Manual therapy, and Re-evaluation  PLAN FOR NEXT SESSION:   Gentle postural exercises, isometrics as tolerated.  Manual therapy, modalities PRN.  Need to assess balance as pain improves.    May need referral to lymphadema specialist.  Needs to r/s OT evaluation.    Darleene Cleaver, PTA 06/01/2022, 11:01 AM

## 2022-06-04 ENCOUNTER — Ambulatory Visit: Payer: Medicare Other

## 2022-06-08 ENCOUNTER — Other Ambulatory Visit: Payer: Self-pay

## 2022-06-08 ENCOUNTER — Encounter: Payer: Self-pay | Admitting: Family Medicine

## 2022-06-08 ENCOUNTER — Ambulatory Visit (INDEPENDENT_AMBULATORY_CARE_PROVIDER_SITE_OTHER): Payer: Medicare Other | Admitting: Family Medicine

## 2022-06-08 ENCOUNTER — Ambulatory Visit: Payer: Medicare Other

## 2022-06-08 VITALS — BP 150/70 | Ht 59.75 in | Wt 99.0 lb

## 2022-06-08 DIAGNOSIS — M25512 Pain in left shoulder: Secondary | ICD-10-CM

## 2022-06-08 DIAGNOSIS — S060X0D Concussion without loss of consciousness, subsequent encounter: Secondary | ICD-10-CM

## 2022-06-08 DIAGNOSIS — M542 Cervicalgia: Secondary | ICD-10-CM

## 2022-06-08 DIAGNOSIS — M1812 Unilateral primary osteoarthritis of first carpometacarpal joint, left hand: Secondary | ICD-10-CM

## 2022-06-08 DIAGNOSIS — M6281 Muscle weakness (generalized): Secondary | ICD-10-CM

## 2022-06-08 DIAGNOSIS — M25511 Pain in right shoulder: Secondary | ICD-10-CM

## 2022-06-08 DIAGNOSIS — M25532 Pain in left wrist: Secondary | ICD-10-CM

## 2022-06-08 DIAGNOSIS — M25531 Pain in right wrist: Secondary | ICD-10-CM

## 2022-06-08 DIAGNOSIS — M62830 Muscle spasm of back: Secondary | ICD-10-CM

## 2022-06-08 NOTE — Assessment & Plan Note (Addendum)
Initial injury on 2/13 after a fall.  Continues to get improvement.  Still notices she has headaches and word finding at times. -Counseled on home exercise therapy and supportive care. -Continue physical therapy

## 2022-06-08 NOTE — Patient Instructions (Signed)
Good to see you Please continue heat on the neck and ice on the thumb  Please continue physical therapy  You can look into the scrambler therapy for the nerve pain Please send me a message in MyChart with any questions or updates.  Please see me back in 6 weeks.   --Dr. Jordan Likes

## 2022-06-08 NOTE — Progress Notes (Signed)
  Monica Khan - 77 y.o. female MRN [id number]  Date of birth: 11/15/1945  SUBJECTIVE:  Including CC & ROS.  No chief complaint on file.   Monica Khan is a 77 y.o. female that is following up for her left thumb pain and concussion.  Her symptoms are slowly improving.  She is getting improvement with physical therapy.     Review of Systems See HPI   HISTORY: Past Medical, Surgical, Social, and Family History Reviewed & Updated per EMR.   Pertinent Historical Findings include:  Past Medical History:  Diagnosis Date   Breast cancer (HCC)    In remission   Fluttering heart    Gastritis    Migraines    Peripheral neuropathy    Shingles    Recurrent    Past Surgical History:  Procedure Laterality Date   ABDOMINAL HYSTERECTOMY     APPENDECTOMY     CHOLECYSTECTOMY     TOTAL MASTECTOMY       PHYSICAL EXAM:  VS: BP (!) 150/70 (BP Location: Right Arm, Patient Position: Sitting)   Ht 4' 11.75" (1.518 m)   Wt 99 lb (44.9 kg)   BMI 19.50 kg/m  Physical Exam Gen: NAD, alert, cooperative with exam, well-appearing MSK: Neurovascularly intact       ASSESSMENT & PLAN:   Concussion with no loss of consciousness Initial injury on 2/13 after a fall.  Continues to get improvement.  Still notices she has headaches and word finding at times. -Counseled on home exercise therapy and supportive care. -Continue physical therapy  Arthritis of carpometacarpal [facility]) joint of left thumb Initial injury on 2/13.  Having pain and soreness at the [facility] joint.  She is wanting to avoid injections due to her history of breast cancer.  She is on anticoagulation and cannot use oral anti-inflammatories. -Counsled on home exercise therapy and supportive care. -Counseled on bracing. -Counseled on Voltaren. - Could consider further imaging

## 2022-06-08 NOTE — Assessment & Plan Note (Signed)
Initial injury on 2/13.  Having pain and soreness at the [facility] joint.  She is wanting to avoid injections due to her history of breast cancer.  She is on anticoagulation and cannot use oral anti-inflammatories. -Counsled on home exercise therapy and supportive care. -Counseled on bracing. -Counseled on Voltaren. - Could consider further imaging

## 2022-06-08 NOTE — Therapy (Signed)
OUTPATIENT PHYSICAL THERAPY CERVICAL PROGRESS REPORT   Progress Note Reporting Period 04/27/22 to 06/08/22  See note below for Objective Data and Assessment of Progress/Goals.     Patient Name: Monica Khan MRN: [id number] DOB:03-31-1945, 77 y.o., female Today's Date: 06/08/2022  END OF SESSION:  PT End of Session - 06/08/22 1010     Visit Number 9    Date for PT Re-Evaluation 06/22/22    Authorization Type Medicare + BCBS    Progress Note Due on Visit 19    PT Start Time 1015    PT Stop Time 1100    PT Time Calculation (min) 45 min    Activity Tolerance Patient tolerated treatment well;Patient limited by pain    Behavior During Therapy WFL for tasks assessed/performed                  Past Medical History:  Diagnosis Date   Breast cancer (HCC)    In remission   Fluttering heart    Gastritis    Migraines    Peripheral neuropathy    Shingles    Recurrent   Past Surgical History:  Procedure Laterality Date   ABDOMINAL HYSTERECTOMY     APPENDECTOMY     CHOLECYSTECTOMY     TOTAL MASTECTOMY     Patient Active Problem List   Diagnosis Date Noted   Wrist sprain, right, initial encounter 04/12/2022   Chondrocalcinosis of right knee 04/12/2022   Arthritis of carpometacarpal (CMC) joint of left thumb 04/12/2022   Cervical strain 04/12/2022   Concussion with no loss of consciousness 04/12/2022   Primary osteoarthritis of left shoulder 04/12/2022   Protein-calorie malnutrition, severe (HCC) 09/20/2012   Hepatocellular injury 09/17/2012   Transaminitis 09/17/2012   Nausea and vomiting 09/17/2012   Shingles 09/17/2012   History of breast cancer 09/17/2012    PCP: Herma Carson, MD  REFERRING PROVIDER: Myra Rude, MD  REFERRING DIAG: [id number] (ICD-10-CM) - Wrist sprain, right, initial encounter M11.261 (ICD-10-CM) - Chondrocalcinosis of right knee M25.532 (ICD-10-CM) - Arthralgia of left wrist S16.1XXA (ICD-10-CM) - Strain of neck muscle, initial  encounter S06.0X0A (ICD-10-CM) - Concussion without loss of consciousness, initial encounter M19.012 (ICD-10-CM) - Primary osteoarthritis of left shoulder  THERAPY DIAG:  Cervicalgia  Acute pain of right shoulder  Acute pain of left shoulder  Pain in right wrist  Pain in left wrist  Muscle spasm of back  Muscle weakness (generalized)  Rationale for Evaluation and Treatment: Rehabilitation  ONSET DATE: 03/23/2022  SUBJECTIVE:  SUBJECTIVE STATEMENT: Pt reports hasn't rescheduled for the OT evaluation as she had to cancel as she was ill and having to obtain transportation from her son in law.  Also just came from MD this am and reports elevated blood pressure.     [name] had a bad fall "it was a doozy" she fell forward and hit her head, had a concussion.  She laid out on the ground for about 20-30 min before neighbor heard her calling.  She has developed a stabbing pain in her back both sides just below shoulder blades.  She also hurt her neck, she has fluid in her L shoulder pressing on nerve endings , has a broken bone, has short term memory loss, had some from chemo, she also has a broken bone in L wrist, hairline fractures in L wrist, maybe fell a little harder on the left, but R shoulder also hurts, sprained her R wrist, but her knee is doing better not hurting as much.  They will hurt but nothing like upper body.  But my ankles, toes, and feet hurt but not as much as shoulders and neck.  Doctor told me it was like getting hit by linebacker.  I have blinding headaches that are worse in afternoon and at night.  I have a wonderful home with long driveway, went to mailbox, was reading mail when walking and tripped over a crack.  That's how I fell.  Concussion left her dizzy and blurred  vision for a week and confused, but better now.   NEXT MD VISIT: 05/03/2023 with Dr. Jordan Likes  PERTINENT HISTORY:  History of breast cancer, short term memory loss, peripheral neuropathy, stent in heart, Afib, long term anticoagulation therapy,  history of CVA, cervical spondylosis, migraines  PAIN:  Are you having pain? Yes: NPRS scale: 6/10 Pain location: L upper traps, lower L neck, Lwrist and forearm Pain description: sharp, shoot, burning, radiates Aggravating factors: moving Relieving factors: heat   PRECAUTIONS: Fall  WEIGHT BEARING RESTRICTIONS: Yes no lifting or pushing with LUE  FALLS:  Has patient fallen in last 6 months? Yes. Number of falls 1  LIVING ENVIRONMENT: Lives with: lives alone Lives in: House/apartment Stairs:  lives downstairs, had full bedroom and bathroom, avoids going upstairs now. Has 1 step up from garage, no handrail, 3 steps at front door, has handrail.   Has following equipment at home: Single point cane, Walker - 2 wheeled, Environmental consultant - 4 wheeled, Marine scientist  OCCUPATION: retired   PLOF: Independent  PATIENT GOALS: 1) get pain down better then 2) be more mobile, be able to cut food up again.   NEXT MD VISIT: 05/03/2023  OBJECTIVE:   DIAGNOSTIC FINDINGS:  03/23/22 R shoulder FINDINGS: Mild widening of the [facility] joint with relative elevation of the distal clavicle. This suggests ligamentous injury at the [facility] joint. No evidence of regional fracture. Humeral head is properly located. 03/23/22 L shoulder FINDINGS: There is no evidence of fracture or dislocation. There is no evidence of arthropathy or other focal bone abnormality. Soft tissues are unremarkable. 03/23/22 CT Cervical Spine IMPRESSION: 1. No acute intracranial process. 2. Atrophy with chronic microvascular ischemic changes. 3. No evidence of facial bone fracture. 4. Degenerative changes in the cervical spine without evidence of acute fracture. 03/23/22 DG Wrist R IMPRESSION: No acute  or traumatic finding. Chronic degenerative changes at the first carpometacarpal joint and articulation of the multangular bones and scaphoid. 03/23/22 DG Wrist Left IMPRESSION: No acute or traumatic finding. Chronic  degenerative changes at the first carpometacarpal joint. Chondrocalcinosis of the wrist joint.  04/19/22 Limited ultrasound: Left wrist pain/left shoulder pain:   Left wrist: Effusion noted within the carpal joints. There is an intense area of hyperemia at the distal radius within the joint.  There is no changes of the distal third of the radius. Degenerative changes of the scaphoid with no hyperemia. Degenerative changes of the [facility] joint.   Left shoulder: Effusion noted within the biceps tendon sheath. Chronic changes of the subscapularis. AC joint with degenerative changes. Chronic tendinopathy of the supraspinatus   Summary: Findings consistent with intra-articular distal radius fracture and effusion of the glenohumeral joint   Ultrasound and interpretation by Clare Gandy, MD  PATIENT SURVEYS:06/08/22: NDI: 66% 33/50 NDI 39/50= 78% impairment Quick Dash 95.5% impairment  COGNITION: Overall cognitive status: History of cognitive impairments - at baseline and reports short term memory loss, increased confusion and memory difficulties following recent concussion  SENSATION: Bil neuropathy in feet from chemotherapy  POSTURE: rounded shoulders and forward head  PALPATION:    CERVICAL ROM:   Active ROM AROM (deg) eval 06/08/22 Progress report  Flexion 15p! 50  Extension 10p! 50  Right lateral flexion    Left lateral flexion    Right rotation 20p! 40  Left rotation 25p! 45   (Blank rows = not tested)  UPPER EXTREMITY ROM:  06/08/22:  assessed B shoulder movements in supine, all planes, with normal ROM flex, abd, Er, IR noted B  Active ROM Right eval Left eval 4/30 06/07/32  Shoulder flexion 80p! 60p! 110 90  Shoulder extension      Shoulder abduction 80p!  40p!    Shoulder adduction      Shoulder extension      Shoulder internal rotation Functional to R PSIS Unable to reach behind To L 3 spinous process To L3 spinous proc  Shoulder external rotation Functional to R ear/forehead Unable to lift arm  Funcitonal to post neck Funcitonal post neck  Elbow flexion      Elbow extension      Wrist flexion      Wrist extension      Wrist ulnar deviation      Wrist radial deviation      Wrist pronation      Wrist supination       (Blank rows = not tested)  UPPER EXTREMITY MMT:  MMT Right 06/08/22 Left 06/08/22  Shoulder flexion 3 4  Shoulder extension 4 4  Shoulder abduction 4 4  Shoulder adduction 4 4  Shoulder extension 4 4  Shoulder internal rotation 4 4  Shoulder external rotation 4 4  Middle trapezius 3- 3-  Lower trapezius 3- 3-  Elbow flexion 4 4  Elbow extension 4 4  Wrist flexion    Wrist extension    Wrist ulnar deviation    Wrist radial deviation    Wrist pronation    Wrist supination    Grip strength     (Blank rows = not tested)   FUNCTIONAL TESTS:  5 times sit to stand: 30 seconds without UE assist  TODAY'S TREATMENT:  DATE:  06/08/22: Reassessed patient for progress note Therapeutic exercise:  focused today on middle and lower traps, periscapular, and spine extensor strength, did not grasp anything due to ongoing L wrist and hand pain/deformity Supine for manual stretching each levator scapulae by PT, 5 reps, 5 to 8 sec holds, followed by isometric holds by patient 5 sec, 5 reps each  Supine hands behind head for shoulder horizontal add/abd (reverse butterflys) " As above with theraband around elbows  Supine for isometric horizontal shoulder abduction with strap around wrists, combined with B shoulder flexion, to engage lower and middle traps Supine for yellow t band ER, shoulders adducted by  trunk, 15 reps Bridging 10 x . Cues to achieve flat back   06/01/22 Therapeutic Exercise: to improve strength and mobility.  Demo, verbal and tactile cues throughout for technique. Table slides flexion and adduction x 10  Gentle cervical rotation x 10  Manual Therapy: to decrease muscle spasm and pain and improve mobility STM/TPR to L UT, L/S, cervical paraspinals   05/26/22 Therapeutic Exercise: to improve strength and mobility.  Demo, verbal and tactile cues throughout for technique. Table slides flexion x 10, scaption x 10 Gentle cervical AROM and levator stretch Manual Therapy: to decrease muscle spasm and pain and improve mobility STM/TPR to L UT, L/S, cervical paraspinals, Kinesiotaping to L wrist using K-tape using 1 strip from L thumb to L lateral epiconyle, strip across wrist to anchor, and another anchoring at epicondyle.    Modalities: MHP to neck in sitting x 10 min (not included in treatment time)  05/19/22 Therapeutic Exercise: to improve strength and mobility.  Demo, verbal and tactile cues throughout for technique. Review of HEP Manual Therapy: to decrease muscle spasm and pain and improve mobility STM/TPR to L UT, L/S, cervical paraspinals, gentle retrograde massage to improve lymphatic drainage in LUE, Kinesiotaping to L wrist using K-tape using 1 strip from L thumb to L lateral epiconyle, and x-strip to base of thumb to decrease pain.    Modalities: MHP to neck in sitting x 10 min (not included in treatment time)   05/13/22 Therapeutic Exercise: to improve strength and mobility.  Demo, verbal and tactile cues throughout for technique. TMR based rotational movements focusing on side of preference starting with upper trunk twist to Right x 10, neck rotation to L 2 x 10.  Manual Therapy: to decrease muscle spasm and pain and improve mobility STM/TPR to bil UT, L/S, cervical parspinals, skilled palpation and monitoring during dry needling. Trigger Point Dry-Needling  Treatment  instructions: Expect mild to moderate muscle soreness. S/S of pneumothorax if dry needled over a lung field, and to seek immediate medical attention should they occur. Patient verbalized understanding of these instructions and education. Patient Consent Given: Yes Education handout provided: Yes Muscles treated: L UT - 1 .25 x 40 mm Electrical stimulation performed: No Parameters: N/A Treatment response/outcome: Twitch Response Elicited and Palpable Increase in Muscle Length Kinesiotaping to L wrist using K-tape Gold, to decrease edema, lantern cut, one tape across L scaphoid, other L dorsal forearm.    PATIENT EDUCATION:  Education details: HEP update Person educated: Patient Education method: Explanation, Demonstration, Verbal cues, and Handouts Education comprehension: verbalized understanding and returned demonstration  HOME EXERCISE PROGRAM: Access Code: [email or web address] URL: [email or web address] Date: 05/26/2022 Prepared by: Harrie Foreman  Exercises - Seated Shoulder Flexion Towel Slide at Table Top  - 2 x daily - 7 x weekly - 1 sets - 10 reps - Seated Shoulder Scaption Slide at  Table Top with Forearm in Neutral  - 2 x daily - 7 x weekly - 1 sets - 10 reps - Gentle Levator Scapulae Stretch  - 2 x daily - 7 x weekly - 1 sets - 3 reps - 10 sec  hold - Seated Cervical Rotation AROM  - 2 x daily - 7 x weekly - 1 sets - 10 reps  ASSESSMENT:  CLINICAL IMPRESSION: Jasamine Pottinger was reassessed today and goals addressed.  Primarily reassessed her B shoulder, cervical spine function and pain.  She has improved flexibility with c spine and B shoulders as compared to initial evaluation.  Weakness and stiffness noted in periscapular musculature.  Did not reassess wrist specifically, she is getting a more pronounced deformity of L MCP jt with 1st digit subluxed.  Encouraged her again to pursue the occupational therapy appt, in order to have better care of her wrist.  Recommend  ongoing skilled PT per POC with additional postural, strengthening exercises    OBJECTIVE IMPAIRMENTS: decreased activity tolerance, decreased balance, decreased endurance, decreased mobility, difficulty walking, decreased ROM, decreased strength, decreased safety awareness, dizziness, increased fascial restrictions, increased muscle spasms, impaired sensation, impaired UE functional use, postural dysfunction, and pain.   ACTIVITY LIMITATIONS: carrying, lifting, bending, sitting, standing, squatting, sleeping, stairs, transfers, bed mobility, bathing, dressing, self feeding, reach over head, hygiene/grooming, and locomotion level  PARTICIPATION LIMITATIONS: meal prep, cleaning, laundry, medication management, driving, shopping, and community activity  PERSONAL FACTORS: Age, Time since onset of injury/illness/exacerbation, Transportation, and 3+ comorbidities: hx of migraines, history of cerebral infarction, history of DVT, history of paroxysmal AF on chronic anticoagulation with warfarin (also taking Plavix), CAD (LAD stent 2021) history ANA positive, adjustment insomnia, chronic pain, history of malignant neoplasm left breast, zoster, B12 deficiency, vitamin D deficiency, osteoporosis  are also affecting patient's functional outcome.   REHAB POTENTIAL: Good  CLINICAL DECISION MAKING: Evolving/moderate complexity  EVALUATION COMPLEXITY: Moderate   GOALS: Goals reviewed with patient? Yes  SHORT TERM GOALS: Target date: 05/18/2022   Patient will be independent with initial HEP.  Baseline:  Goal status: MET 05/26/22   Patient will have balance evaluated with appropriate goal set if deficits.  Baseline:  Goal status: Deferred   LONG TERM GOALS: Target date: 06/22/2022   Patient will be independent with advanced/ongoing HEP to improve outcomes and carryover.  Baseline:  Goal status: IN PROGRESS  2.  Patient will report 75% improvement in neck pain to improve QOL.  Baseline:  Goal status:  IN PROGRESS 06/08/22, rated 6/10   3.  Patient will demonstrate 15 deg improvement in cervical ROM without pain for safety with walking.    Baseline: see objective Goal status: IN PROGRESS 06/08/22: goal met so far, much improved   4.  Patient will report 15% improvement on NDI to demonstrate improved functional ability.  Baseline: 78% impairment Goal status: IN PROGRESS 05/29/22 66% impaired 12 percent improvement  5. Patient will report 75% improvement in bil shoulder pain to improve QOL.  Baseline: severe Goal status: IN PROGRESS 06/08/22, pain rated 6/10, but improved AROM  6.  Patient to improve bil shoulder AROM to [facility] without pain provocation to allow for increased ease of ADLs.  Baseline: see objective Goal status: IN PROGRESS 06/08/22: improving, much better AROM measurements B, still difficulty moving shoulders enough to effectively fix her hair, manage reaching activities, handing laundry, etc  7.  Patient will report <50% impairment on QuickDash to demonstrate improved functional ability.  Baseline: 95.5% impairment Goal status: IN  PROGRESS 06/08/22:  NA today    PLAN:  PT FREQUENCY: 2x/week  PT DURATION: 8 weeks  PLANNED INTERVENTIONS: Therapeutic exercises, Therapeutic activity, Neuromuscular re-education, Balance training, Gait training, Patient/Family education, Self Care, Joint mobilization, Stair training, Vestibular training, Visual/preceptual remediation/compensation, Dry Needling, Electrical stimulation, Spinal mobilization, Cryotherapy, Moist heat, Taping, Ultrasound, Ionotophoresis 4mg /ml Dexamethasone, Manual therapy, and Re-evaluation  PLAN FOR NEXT SESSION:   Gentle postural exercises, isometrics as tolerated.  Manual therapy, modalities PRN.  Need to assess balance as pain improves.    May need referral to lymphadema specialist.  Needs to r/s OT evaluation.    Amenda Duclos L Riyaan Heroux, PT 06/08/2022, 12:19 PM

## 2022-06-11 ENCOUNTER — Ambulatory Visit: Payer: Medicare Other | Admitting: Physical Therapy

## 2022-06-15 ENCOUNTER — Ambulatory Visit: Payer: Medicare Other | Attending: Family Medicine

## 2022-06-15 DIAGNOSIS — M62830 Muscle spasm of back: Secondary | ICD-10-CM | POA: Insufficient documentation

## 2022-06-15 DIAGNOSIS — M25511 Pain in right shoulder: Secondary | ICD-10-CM | POA: Diagnosis present

## 2022-06-15 DIAGNOSIS — M25512 Pain in left shoulder: Secondary | ICD-10-CM

## 2022-06-15 DIAGNOSIS — M25531 Pain in right wrist: Secondary | ICD-10-CM | POA: Diagnosis present

## 2022-06-15 DIAGNOSIS — M25532 Pain in left wrist: Secondary | ICD-10-CM | POA: Diagnosis present

## 2022-06-15 DIAGNOSIS — M6281 Muscle weakness (generalized): Secondary | ICD-10-CM | POA: Diagnosis present

## 2022-06-15 DIAGNOSIS — M542 Cervicalgia: Secondary | ICD-10-CM

## 2022-06-15 NOTE — Therapy (Signed)
OUTPATIENT PHYSICAL THERAPY CERVICAL TREATMENT    Patient Name: Monica Khan MRN: [id number] DOB:04/13/1945, 77 y.o., female Today's Date: 06/15/2022  END OF SESSION:  PT End of Session - 06/15/22 1123     Visit Number 10    Date for PT Re-Evaluation 06/22/22    Authorization Type Medicare + BCBS    Progress Note Due on Visit 19    PT Start Time 1019    PT Stop Time 1101    PT Time Calculation (min) 42 min    Activity Tolerance Patient tolerated treatment well;Patient limited by pain    Behavior During Therapy WFL for tasks assessed/performed                  Past Medical History:  Diagnosis Date   Breast cancer (HCC)    In remission   Fluttering heart    Gastritis    Migraines    Peripheral neuropathy    Shingles    Recurrent   Past Surgical History:  Procedure Laterality Date   ABDOMINAL HYSTERECTOMY     APPENDECTOMY     CHOLECYSTECTOMY     TOTAL MASTECTOMY     Patient Active Problem List   Diagnosis Date Noted   Wrist sprain, right, initial encounter 04/12/2022   Chondrocalcinosis of right knee 04/12/2022   Arthritis of carpometacarpal (CMC) joint of left thumb 04/12/2022   Cervical strain 04/12/2022   Concussion with no loss of consciousness 04/12/2022   Primary osteoarthritis of left shoulder 04/12/2022   Protein-calorie malnutrition, severe (HCC) 09/20/2012   Hepatocellular injury 09/17/2012   Transaminitis 09/17/2012   Nausea and vomiting 09/17/2012   Shingles 09/17/2012   History of breast cancer 09/17/2012    PCP: Monica Carson, MD  REFERRING PROVIDER: Myra Rude, MD  REFERRING DIAG: [phone] (ICD-10-CM) - Wrist sprain, right, initial encounter M11.261 (ICD-10-CM) - Chondrocalcinosis of right knee M25.532 (ICD-10-CM) - Arthralgia of left wrist S16.1XXA (ICD-10-CM) - Strain of neck muscle, initial encounter S06.0X0A (ICD-10-CM) - Concussion without loss of consciousness, initial encounter M19.012 (ICD-10-CM) - Primary  osteoarthritis of left shoulder  THERAPY DIAG:  Cervicalgia  Acute pain of right shoulder  Acute pain of left shoulder  Pain in right wrist  Pain in left wrist  Muscle spasm of back  Muscle weakness (generalized)  Rationale for Evaluation and Treatment: Rehabilitation  ONSET DATE: 03/23/2022  SUBJECTIVE:  SUBJECTIVE STATEMENT: Really stiff today and hurting on L arm.      Monica Khan had a bad fall "it was a doozy" she fell forward and hit her head, had a concussion.  She laid out on the ground for about 20-30 min before neighbor heard her calling.  She has developed a stabbing pain in her back both sides just below shoulder blades.  She also hurt her neck, she has fluid in her L shoulder pressing on nerve endings , has a broken bone, has short term memory loss, had some from chemo, she also has a broken bone in L wrist, hairline fractures in L wrist, maybe fell a little harder on the left, but R shoulder also hurts, sprained her R wrist, but her knee is doing better not hurting as much.  They will hurt but nothing like upper body.  But my ankles, toes, and feet hurt but not as much as shoulders and neck.  Doctor told me it was like getting hit by linebacker.  I have blinding headaches that are worse in afternoon and at night.  I have a wonderful home with long driveway, went to mailbox, was reading mail when walking and tripped over a crack.  That's how I fell.  Concussion left her dizzy and blurred vision for a week and confused, but better now.   NEXT MD VISIT: 05/03/2023 with Dr. Jordan Khan  PERTINENT HISTORY:  History of breast cancer, short term memory loss, peripheral neuropathy, stent in heart, Afib, long term anticoagulation therapy,  history of CVA, cervical spondylosis,  migraines  PAIN:  Are you having pain? Yes: NPRS scale: 8/10 Pain location: L upper traps, lower L neck, Lwrist and forearm Pain description: sharp, shoot, burning, radiates Aggravating factors: moving Relieving factors: heat   PRECAUTIONS: Fall  WEIGHT BEARING RESTRICTIONS: Yes no lifting or pushing with LUE  FALLS:  Has patient fallen in last 6 months? Yes. Number of falls 1  LIVING ENVIRONMENT: Lives with: lives alone Lives in: House/apartment Stairs:  lives downstairs, had full bedroom and bathroom, avoids going upstairs now. Has 1 step up from garage, no handrail, 3 steps at front door, has handrail.   Has following equipment at home: Single point cane, Walker - 2 wheeled, Environmental consultant - 4 wheeled, Marine scientist  OCCUPATION: retired   PLOF: Independent  PATIENT GOALS: 1) get pain down better then 2) be more mobile, be able to cut food up again.   NEXT MD VISIT: 05/03/2023  OBJECTIVE:   DIAGNOSTIC FINDINGS:  03/23/22 R shoulder FINDINGS: Mild widening of the [facility] joint with relative elevation of the distal clavicle. This suggests ligamentous injury at the [facility] joint. No evidence of regional fracture. Humeral head is properly located. 03/23/22 L shoulder FINDINGS: There is no evidence of fracture or dislocation. There is no evidence of arthropathy or other focal bone abnormality. Soft tissues are unremarkable. 03/23/22 CT Cervical Spine IMPRESSION: 1. No acute intracranial process. 2. Atrophy with chronic microvascular ischemic changes. 3. No evidence of facial bone fracture. 4. Degenerative changes in the cervical spine without evidence of acute fracture. 03/23/22 DG Wrist R IMPRESSION: No acute or traumatic finding. Chronic degenerative changes at the first carpometacarpal joint and articulation of the multangular bones and scaphoid. 03/23/22 DG Wrist Left IMPRESSION: No acute or traumatic finding. Chronic degenerative changes at the first carpometacarpal joint.  Chondrocalcinosis of the wrist joint.  04/19/22 Limited ultrasound: Left wrist pain/left shoulder pain:   Left wrist: Effusion noted within the carpal joints.  There is an intense area of hyperemia at the distal radius within the joint.  There is no changes of the distal third of the radius. Degenerative changes of the scaphoid with no hyperemia. Degenerative changes of the [facility] joint.   Left shoulder: Effusion noted within the biceps tendon sheath. Chronic changes of the subscapularis. AC joint with degenerative changes. Chronic tendinopathy of the supraspinatus   Summary: Findings consistent with intra-articular distal radius fracture and effusion of the glenohumeral joint   Ultrasound and interpretation by Clare Gandy, MD  PATIENT SURVEYS:06/08/22: NDI: 66% 33/50 NDI 39/50= 78% impairment Quick Dash 95.5% impairment  COGNITION: Overall cognitive status: History of cognitive impairments - at baseline and reports short term memory loss, increased confusion and memory difficulties following recent concussion  SENSATION: Bil neuropathy in feet from chemotherapy  POSTURE: rounded shoulders and forward head  PALPATION:    CERVICAL ROM:   Active ROM AROM (deg) eval 06/08/22 Progress report  Flexion 15p! 50  Extension 10p! 50  Right lateral flexion    Left lateral flexion    Right rotation 20p! 40  Left rotation 25p! 45   (Blank rows = not tested)  UPPER EXTREMITY ROM:  06/08/22:  assessed B shoulder movements in supine, all planes, with normal ROM flex, abd, Er, IR noted B  Active ROM Right eval Left eval 4/30 06/07/32  Shoulder flexion 80p! 60p! 110 90  Shoulder extension      Shoulder abduction 80p! 40p!    Shoulder adduction      Shoulder extension      Shoulder internal rotation Functional to R PSIS Unable to reach behind To L 3 spinous process To L3 spinous proc  Shoulder external rotation Functional to R ear/forehead Unable to lift arm  Funcitonal to post neck  Funcitonal post neck  Elbow flexion      Elbow extension      Wrist flexion      Wrist extension      Wrist ulnar deviation      Wrist radial deviation      Wrist pronation      Wrist supination       (Blank rows = not tested)  UPPER EXTREMITY MMT:  MMT Right 06/08/22 Left 06/08/22  Shoulder flexion 3 4  Shoulder extension 4 4  Shoulder abduction 4 4  Shoulder adduction 4 4  Shoulder extension 4 4  Shoulder internal rotation 4 4  Shoulder external rotation 4 4  Middle trapezius 3- 3-  Lower trapezius 3- 3-  Elbow flexion 4 4  Elbow extension 4 4  Wrist flexion    Wrist extension    Wrist ulnar deviation    Wrist radial deviation    Wrist pronation    Wrist supination    Grip strength     (Blank rows = not tested)   FUNCTIONAL TESTS:  5 times sit to stand: 30 seconds without UE assist  TODAY'S TREATMENT:  DATE:  06/15/22 Therapeutic Exercise: to improve strength and mobility.  Supine exercises: Chin tucks 2x10 3" Isometric shoulder ER B 10x with belt around wrist Isometric horiz ABD with belt arms arm 10x B shoulder flexion x 10 - mild pain on L side Manual Therapy: to decrease muscle spasm and pain and improve mobility STM to L UT, LS, cervical paraspinals in supine  06/08/22: Reassessed patient for progress note Therapeutic exercise:  focused today on middle and lower traps, periscapular, and spine extensor strength, did not grasp anything due to ongoing L wrist and hand pain/deformity Supine for manual stretching each levator scapulae by PT, 5 reps, 5 to 8 sec holds, followed by isometric holds by patient 5 sec, 5 reps each  Supine hands behind head for shoulder horizontal add/abd (reverse butterflys) " As above with theraband around elbows  Supine for isometric horizontal shoulder abduction with strap around wrists, combined with B  shoulder flexion, to engage lower and middle traps Supine for yellow t band ER, shoulders adducted by trunk, 15 reps Bridging 10 x . Cues to achieve flat back   06/01/22 Therapeutic Exercise: to improve strength and mobility.  Demo, verbal and tactile cues throughout for technique. Table slides flexion and adduction x 10  Gentle cervical rotation x 10  Manual Therapy: to decrease muscle spasm and pain and improve mobility STM/TPR to L UT, L/S, cervical paraspinals   05/26/22 Therapeutic Exercise: to improve strength and mobility.  Demo, verbal and tactile cues throughout for technique. Table slides flexion x 10, scaption x 10 Gentle cervical AROM and levator stretch Manual Therapy: to decrease muscle spasm and pain and improve mobility STM/TPR to L UT, L/S, cervical paraspinals, Kinesiotaping to L wrist using K-tape using 1 strip from L thumb to L lateral epiconyle, strip across wrist to anchor, and another anchoring at epicondyle.    Modalities: MHP to neck in sitting x 10 min (not included in treatment time)  05/19/22 Therapeutic Exercise: to improve strength and mobility.  Demo, verbal and tactile cues throughout for technique. Review of HEP Manual Therapy: to decrease muscle spasm and pain and improve mobility STM/TPR to L UT, L/S, cervical paraspinals, gentle retrograde massage to improve lymphatic drainage in LUE, Kinesiotaping to L wrist using K-tape using 1 strip from L thumb to L lateral epiconyle, and x-strip to base of thumb to decrease pain.    Modalities: MHP to neck in sitting x 10 min (not included in treatment time)   05/13/22 Therapeutic Exercise: to improve strength and mobility.  Demo, verbal and tactile cues throughout for technique. TMR based rotational movements focusing on side of preference starting with upper trunk twist to Right x 10, neck rotation to L 2 x 10.  Manual Therapy: to decrease muscle spasm and pain and improve mobility STM/TPR to bil UT, L/S, cervical  parspinals, skilled palpation and monitoring during dry needling. Trigger Point Dry-Needling  Treatment instructions: Expect mild to moderate muscle soreness. S/S of pneumothorax if dry needled over a lung field, and to seek immediate medical attention should they occur. Patient verbalized understanding of these instructions and education. Patient Consent Given: Yes Education handout provided: Yes Muscles treated: L UT - 1 .25 x 40 mm Electrical stimulation performed: No Parameters: N/A Treatment response/outcome: Twitch Response Elicited and Palpable Increase in Muscle Length Kinesiotaping to L wrist using K-tape Gold, to decrease edema, lantern cut, one tape across L scaphoid, other L dorsal forearm.    PATIENT EDUCATION:  Education details: HEP update Person  educated: Patient Education method: Explanation, Demonstration, Verbal cues, and Handouts Education comprehension: verbalized understanding and returned demonstration  HOME EXERCISE PROGRAM: Access Code: [email or web address] URL: [email or web address] Date: 05/26/2022 Prepared by: Harrie Foreman  Exercises - Seated Shoulder Flexion Towel Slide at Table Top  - 2 x daily - 7 x weekly - 1 sets - 10 reps - Seated Shoulder Scaption Slide at Table Top with Forearm in Neutral  - 2 x daily - 7 x weekly - 1 sets - 10 reps - Gentle Levator Scapulae Stretch  - 2 x daily - 7 x weekly - 1 sets - 3 reps - 10 sec  hold - Seated Cervical Rotation AROM  - 2 x daily - 7 x weekly - 1 sets - 10 reps  ASSESSMENT:  CLINICAL IMPRESSION: Continued to work on supine neck ROM and postural exercises. Also applied heat to her neck to assist with muscle relaxation and this improved her exercise tolerance. She did have some pain with B shoulder flexion, afterwards shifted to manual therapy to address this. Taut bands found in the L UT and LS. She would continue to benefit from skilled therapy d/t decreased exercise tolerance and continuing focus on  pain.   OBJECTIVE IMPAIRMENTS: decreased activity tolerance, decreased balance, decreased endurance, decreased mobility, difficulty walking, decreased ROM, decreased strength, decreased safety awareness, dizziness, increased fascial restrictions, increased muscle spasms, impaired sensation, impaired UE functional use, postural dysfunction, and pain.   ACTIVITY LIMITATIONS: carrying, lifting, bending, sitting, standing, squatting, sleeping, stairs, transfers, bed mobility, bathing, dressing, self feeding, reach over head, hygiene/grooming, and locomotion level  PARTICIPATION LIMITATIONS: meal prep, cleaning, laundry, medication management, driving, shopping, and community activity  PERSONAL FACTORS: Age, Time since onset of injury/illness/exacerbation, Transportation, and 3+ comorbidities: hx of migraines, history of cerebral infarction, history of DVT, history of paroxysmal AF on chronic anticoagulation with warfarin (also taking Plavix), CAD (LAD stent 2021) history ANA positive, adjustment insomnia, chronic pain, history of malignant neoplasm left breast, zoster, B12 deficiency, vitamin D deficiency, osteoporosis  are also affecting patient's functional outcome.   REHAB POTENTIAL: Good  CLINICAL DECISION MAKING: Evolving/moderate complexity  EVALUATION COMPLEXITY: Moderate   GOALS: Goals reviewed with patient? Yes  SHORT TERM GOALS: Target date: 05/18/2022   Patient will be independent with initial HEP.  Baseline:  Goal status: MET 05/26/22   Patient will have balance evaluated with appropriate goal set if deficits.  Baseline:  Goal status: Deferred   LONG TERM GOALS: Target date: 06/22/2022   Patient will be independent with advanced/ongoing HEP to improve outcomes and carryover.  Baseline:  Goal status: IN PROGRESS  2.  Patient will report 75% improvement in neck pain to improve QOL.  Baseline:  Goal status: IN PROGRESS 06/08/22, rated 6/10   3.  Patient will demonstrate 15  deg improvement in cervical ROM without pain for safety with walking.    Baseline: see objective Goal status: IN PROGRESS 06/08/22: goal met so far, much improved   4.  Patient will report 15% improvement on NDI to demonstrate improved functional ability.  Baseline: 78% impairment Goal status: IN PROGRESS 05/29/22 66% impaired 12 percent improvement  5. Patient will report 75% improvement in bil shoulder pain to improve QOL.  Baseline: severe Goal status: IN PROGRESS 06/08/22, pain rated 6/10, but improved AROM  6.  Patient to improve bil shoulder AROM to [facility] without pain provocation to allow for increased ease of ADLs.  Baseline: see objective Goal status: IN PROGRESS 06/08/22: improving, much better AROM measurements B,  still difficulty moving shoulders enough to effectively fix her hair, manage reaching activities, handing laundry, etc  7.  Patient will report <50% impairment on QuickDash to demonstrate improved functional ability.  Baseline: 95.5% impairment Goal status: IN PROGRESS 06/08/22:  NA today    PLAN:  PT FREQUENCY: 2x/week  PT DURATION: 8 weeks  PLANNED INTERVENTIONS: Therapeutic exercises, Therapeutic activity, Neuromuscular re-education, Balance training, Gait training, Patient/Family education, Self Care, Joint mobilization, Stair training, Vestibular training, Visual/preceptual remediation/compensation, Dry Needling, Electrical stimulation, Spinal mobilization, Cryotherapy, Moist heat, Taping, Ultrasound, Ionotophoresis 4mg /ml Dexamethasone, Manual therapy, and Re-evaluation  PLAN FOR NEXT SESSION:   Gentle postural exercises, isometrics as tolerated.  Manual therapy, modalities PRN.  Need to assess balance as pain improves.    May need referral to lymphadema specialist.  Needs to r/s OT evaluation.    Darleene Cleaver, PTA 06/15/2022, 11:34 AM

## 2022-06-17 ENCOUNTER — Ambulatory Visit: Payer: Medicare Other

## 2022-06-17 ENCOUNTER — Other Ambulatory Visit: Payer: Self-pay

## 2022-06-17 DIAGNOSIS — M542 Cervicalgia: Secondary | ICD-10-CM | POA: Diagnosis not present

## 2022-06-17 DIAGNOSIS — M62830 Muscle spasm of back: Secondary | ICD-10-CM

## 2022-06-17 DIAGNOSIS — M25511 Pain in right shoulder: Secondary | ICD-10-CM

## 2022-06-17 DIAGNOSIS — M6281 Muscle weakness (generalized): Secondary | ICD-10-CM

## 2022-06-17 DIAGNOSIS — M25512 Pain in left shoulder: Secondary | ICD-10-CM

## 2022-06-17 NOTE — Therapy (Signed)
OUTPATIENT PHYSICAL THERAPY CERVICAL TREATMENT    Patient Name: Monica Khan MRN: [id number] DOB:[birth year], 77 y.o., female Today's Date: 06/17/2022  END OF SESSION:  PT End of Session - 06/17/22 1016     Visit Number 11    Number of Visits 16    Date for PT Re-Evaluation 06/22/22    Authorization Type Medicare + BCBS    Progress Note Due on Visit 19    PT Start Time 1016    PT Stop Time 1100    PT Time Calculation (min) 44 min    Activity Tolerance Patient tolerated treatment well;Patient limited by pain    Behavior During Therapy WFL for tasks assessed/performed                  Past Medical History:  Diagnosis Date   Breast cancer (HCC)    In remission   Fluttering heart    Gastritis    Migraines    Peripheral neuropathy    Shingles    Recurrent   Past Surgical History:  Procedure Laterality Date   ABDOMINAL HYSTERECTOMY     APPENDECTOMY     CHOLECYSTECTOMY     TOTAL MASTECTOMY     Patient Active Problem List   Diagnosis Date Noted   Wrist sprain, right, initial encounter 04/12/2022   Chondrocalcinosis of right knee 04/12/2022   Arthritis of carpometacarpal (CMC) joint of left thumb 04/12/2022   Cervical strain 04/12/2022   Concussion with no loss of consciousness 04/12/2022   Primary osteoarthritis of left shoulder 04/12/2022   Protein-calorie malnutrition, severe (HCC) 09/20/2012   Hepatocellular injury 09/17/2012   Transaminitis 09/17/2012   Nausea and vomiting 09/17/2012   Shingles 09/17/2012   History of breast cancer 09/17/2012    PCP: Herma Carson, MD  REFERRING PROVIDER: Myra Rude, MD  REFERRING DIAG: [phone] (ICD-10-CM) - Wrist sprain, right, initial encounter M11.261 (ICD-10-CM) - Chondrocalcinosis of right knee M25.532 (ICD-10-CM) - Arthralgia of left wrist S16.1XXA (ICD-10-CM) - Strain of neck muscle, initial encounter S06.0X0A (ICD-10-CM) - Concussion without loss of consciousness, initial encounter M19.012  (ICD-10-CM) - Primary osteoarthritis of left shoulder  THERAPY DIAG:  Cervicalgia  Acute pain of right shoulder  Acute pain of left shoulder  Muscle spasm of back  Muscle weakness (generalized)  Rationale for Evaluation and Treatment: Rehabilitation  ONSET DATE: 03/23/2022  SUBJECTIVE:  SUBJECTIVE STATEMENT: Gets sore and stiff with the exercises but does note that she can move her shoulders , L wrist and hand, and shoulders better    [name] had a bad fall "it was a doozy" she fell forward and hit her head, had a concussion.  She laid out on the ground for about 20-30 min before neighbor heard her calling.  She has developed a stabbing pain in her back both sides just below shoulder blades.  She also hurt her neck, she has fluid in her L shoulder pressing on nerve endings , has a broken bone, has short term memory loss, had some from chemo, she also has a broken bone in L wrist, hairline fractures in L wrist, maybe fell a little harder on the left, but R shoulder also hurts, sprained her R wrist, but her knee is doing better not hurting as much.  They will hurt but nothing like upper body.  But my ankles, toes, and feet hurt but not as much as shoulders and neck.  Doctor told me it was like getting hit by linebacker.  I have blinding headaches that are worse in afternoon and at night.  I have a wonderful home with long driveway, went to mailbox, was reading mail when walking and tripped over a crack.  That's how I fell.  Concussion left her dizzy and blurred vision for a week and confused, but better now.   NEXT MD VISIT: 05/03/2023 with Dr. Jordan Likes  PERTINENT HISTORY:  History of breast cancer, short term memory loss, peripheral neuropathy, stent in heart, Afib, long term anticoagulation  therapy,  history of CVA, cervical spondylosis, migraines  PAIN:  Are you having pain? Yes: NPRS scale: 6/10 Pain location: L upper traps, lower L neck, Lwrist and forearm Pain description: sharp, shoot, burning, radiates Aggravating factors: moving Relieving factors: heat   PRECAUTIONS: Fall  WEIGHT BEARING RESTRICTIONS: Yes no lifting or pushing with LUE  FALLS:  Has patient fallen in last 6 months? Yes. Number of falls 1  LIVING ENVIRONMENT: Lives with: lives alone Lives in: House/apartment Stairs:  lives downstairs, had full bedroom and bathroom, avoids going upstairs now. Has 1 step up from garage, no handrail, 3 steps at front door, has handrail.   Has following equipment at home: Single point cane, Walker - 2 wheeled, Environmental consultant - 4 wheeled, Marine scientist  OCCUPATION: retired   PLOF: Independent  PATIENT GOALS: 1) get pain down better then 2) be more mobile, be able to cut food up again.   NEXT MD VISIT: 05/03/2023  OBJECTIVE:   DIAGNOSTIC FINDINGS:  03/23/22 R shoulder FINDINGS: Mild widening of the [facility] joint with relative elevation of the distal clavicle. This suggests ligamentous injury at the [facility] joint. No evidence of regional fracture. Humeral head is properly located. 03/23/22 L shoulder FINDINGS: There is no evidence of fracture or dislocation. There is no evidence of arthropathy or other focal bone abnormality. Soft tissues are unremarkable. 03/23/22 CT Cervical Spine IMPRESSION: 1. No acute intracranial process. 2. Atrophy with chronic microvascular ischemic changes. 3. No evidence of facial bone fracture. 4. Degenerative changes in the cervical spine without evidence of acute fracture. 03/23/22 DG Wrist R IMPRESSION: No acute or traumatic finding. Chronic degenerative changes at the first carpometacarpal joint and articulation of the multangular bones and scaphoid. 03/23/22 DG Wrist Left IMPRESSION: No acute or traumatic finding. Chronic degenerative changes  at the first carpometacarpal joint. Chondrocalcinosis of the wrist joint.  04/19/22 Limited ultrasound: Left  wrist pain/left shoulder pain:   Left wrist: Effusion noted within the carpal joints. There is an intense area of hyperemia at the distal radius within the joint.  There is no changes of the distal third of the radius. Degenerative changes of the scaphoid with no hyperemia. Degenerative changes of the [facility] joint.   Left shoulder: Effusion noted within the biceps tendon sheath. Chronic changes of the subscapularis. AC joint with degenerative changes. Chronic tendinopathy of the supraspinatus   Summary: Findings consistent with intra-articular distal radius fracture and effusion of the glenohumeral joint   Ultrasound and interpretation by Clare Gandy, MD  PATIENT SURVEYS:06/08/22: NDI: 66% 33/50 NDI 39/50= 78% impairment Quick Dash 95.5% impairment  COGNITION: Overall cognitive status: History of cognitive impairments - at baseline and reports short term memory loss, increased confusion and memory difficulties following recent concussion  SENSATION: Bil neuropathy in feet from chemotherapy  POSTURE: rounded shoulders and forward head  PALPATION:    CERVICAL ROM:   Active ROM AROM (deg) eval 06/08/22 Progress report  Flexion 15p! 50  Extension 10p! 50  Right lateral flexion    Left lateral flexion    Right rotation 20p! 40  Left rotation 25p! 45   (Blank rows = not tested)  UPPER EXTREMITY ROM:  06/08/22:  assessed B shoulder movements in supine, all planes, with normal ROM flex, abd, Er, IR noted B  Active ROM Right eval Left eval 4/30 06/07/32  Shoulder flexion 80p! 60p! 110 90  Shoulder extension      Shoulder abduction 80p! 40p!    Shoulder adduction      Shoulder extension      Shoulder internal rotation Functional to R PSIS Unable to reach behind To L 3 spinous process To L3 spinous proc  Shoulder external rotation Functional to R ear/forehead Unable  to lift arm  Funcitonal to post neck Funcitonal post neck  Elbow flexion      Elbow extension      Wrist flexion      Wrist extension      Wrist ulnar deviation      Wrist radial deviation      Wrist pronation      Wrist supination       (Blank rows = not tested)  UPPER EXTREMITY MMT:  MMT Right 06/08/22 Left 06/08/22  Shoulder flexion 3 4  Shoulder extension 4 4  Shoulder abduction 4 4  Shoulder adduction 4 4  Shoulder extension 4 4  Shoulder internal rotation 4 4  Shoulder external rotation 4 4  Middle trapezius 3- 3-  Lower trapezius 3- 3-  Elbow flexion 4 4  Elbow extension 4 4  Wrist flexion    Wrist extension    Wrist ulnar deviation    Wrist radial deviation    Wrist pronation    Wrist supination    Grip strength     (Blank rows = not tested)   FUNCTIONAL TESTS:  5 times sit to stand: 30 seconds without UE assist  TODAY'S TREATMENT:  06/17/22:  Moist heat prior to  manual technique and therex for 8 min, not included in treatment time  Therex:  Manual:   Supine for manual stretching, intermittent bouts of manual stretch and contract /relax to improve tissue mobility, pain, muscle spasm, B upper cervical spine, B upper traps, B levator scapulae. Also intermittent cervical distraction  Therex:  - Supine hands behind head for shoulder horizontal add/abd (reverse butterflys) -Supine for isometric horizontal shoulder abduction with strap around  wrists, combined with B shoulder flexion, to engage lower and middle traps  -supine belt around wrists for isometric shoulder ER 5 sec holds, 10 reps -supine holding yard stick B hands for chest press then serratus punches, multiple verbal, tactile cues to avoid shrugging shoulders, improved with repetition -standing for isometric chin tuck with ball behind head for cervical rotation  -standing facing wall for B shoulder flexion, wall slides up wall, with stretch hold to tolerance end range.                                                                                                                      06/15/22 Therapeutic Exercise: to improve strength and mobility.  Supine exercises: Chin tucks 2x10 3" Isometric shoulder ER B 10x with belt around wrist Isometric horiz ABD with belt arms arm 10x B shoulder flexion x 10 - mild pain on L side Manual Therapy: to decrease muscle spasm and pain and improve mobility STM to L UT, LS, cervical paraspinals in supine  06/08/22: Reassessed patient for progress note Therapeutic exercise:  focused today on middle and lower traps, periscapular, and spine extensor strength, did not grasp anything due to ongoing L wrist and hand pain/deformity Supine for manual stretching each levator scapulae by PT, 5 reps, 5 to 8 sec holds, followed by isometric holds by patient 5 sec, 5 reps each  Supine hands behind head for shoulder horizontal add/abd (reverse butterflys) " As above with theraband around elbows  Supine for isometric horizontal shoulder abduction with strap around wrists, combined with B shoulder flexion, to engage lower and middle traps Supine for yellow t band ER, shoulders adducted by trunk, 15 reps Bridging 10 x . Cues to achieve flat back   06/01/22 Therapeutic Exercise: to improve strength and mobility.  Demo, verbal and tactile cues throughout for technique. Table slides flexion and adduction x 10  Gentle cervical rotation x 10  Manual Therapy: to decrease muscle spasm and pain and improve mobility STM/TPR to L UT, L/S, cervical paraspinals   05/26/22 Therapeutic Exercise: to improve strength and mobility.  Demo, verbal and tactile cues throughout for technique. Table slides flexion x 10, scaption x 10 Gentle cervical AROM and levator stretch Manual Therapy: to decrease muscle spasm and pain and improve mobility STM/TPR to L UT, L/S, cervical paraspinals, Kinesiotaping to L wrist using K-tape using 1 strip from L thumb to L lateral epiconyle, strip across wrist  to anchor, and another anchoring at epicondyle.    Modalities: MHP to neck in sitting x 10 min (not included in treatment time)  05/19/22 Therapeutic Exercise: to improve strength and mobility.  Demo, verbal and tactile cues throughout for technique. Review of HEP Manual Therapy: to decrease muscle spasm and pain and improve mobility STM/TPR to L UT, L/S, cervical paraspinals, gentle retrograde massage to improve lymphatic drainage in LUE, Kinesiotaping to L wrist using K-tape using 1 strip from L thumb to L lateral epiconyle, and x-strip to base of thumb to decrease  pain.    Modalities: MHP to neck in sitting x 10 min (not included in treatment time)   05/13/22 Therapeutic Exercise: to improve strength and mobility.  Demo, verbal and tactile cues throughout for technique. TMR based rotational movements focusing on side of preference starting with upper trunk twist to Right x 10, neck rotation to L 2 x 10.  Manual Therapy: to decrease muscle spasm and pain and improve mobility STM/TPR to bil UT, L/S, cervical parspinals, skilled palpation and monitoring during dry needling. Trigger Point Dry-Needling  Treatment instructions: Expect mild to moderate muscle soreness. S/S of pneumothorax if dry needled over a lung field, and to seek immediate medical attention should they occur. Patient verbalized understanding of these instructions and education. Patient Consent Given: Yes Education handout provided: Yes Muscles treated: L UT - 1 .25 x 40 mm Electrical stimulation performed: No Parameters: N/A Treatment response/outcome: Twitch Response Elicited and Palpable Increase in Muscle Length Kinesiotaping to L wrist using K-tape Gold, to decrease edema, lantern cut, one tape across L scaphoid, other L dorsal forearm.    PATIENT EDUCATION:  Education details: HEP update Person educated: Patient Education method: Explanation, Demonstration, Verbal cues, and Handouts Education comprehension: verbalized  understanding and returned demonstration  HOME EXERCISE PROGRAM: Access Code: [email or web address] URL: [email or web address] Date: 05/26/2022 Prepared by: Harrie Foreman  Exercises - Seated Shoulder Flexion Towel Slide at Table Top  - 2 x daily - 7 x weekly - 1 sets - 10 reps - Seated Shoulder Scaption Slide at Table Top with Forearm in Neutral  - 2 x daily - 7 x weekly - 1 sets - 10 reps - Gentle Levator Scapulae Stretch  - 2 x daily - 7 x weekly - 1 sets - 3 reps - 10 sec  hold - Seated Cervical Rotation AROM  - 2 x daily - 7 x weekly - 1 sets - 10 reps  ASSESSMENT:  CLINICAL IMPRESSION: Tifany is attending skilled PT due to diffuse musculoskeletal pain neck, shoulders, L after a hard fall.  Demonstrates a hunched posture, and limited neck ROM, chief c/o pain L upper traps region.  Continued today to address her flexiblity cervical spine as well and gently engage her postural musculature.  Tends to habitually shrug B shoulders and protract , working on reversing this position. For reassessment next week,  also still wants to schedule with OT at our other clinic soon. OBJECTIVE IMPAIRMENTS: decreased activity tolerance, decreased balance, decreased endurance, decreased mobility, difficulty walking, decreased ROM, decreased strength, decreased safety awareness, dizziness, increased fascial restrictions, increased muscle spasms, impaired sensation, impaired UE functional use, postural dysfunction, and pain.   ACTIVITY LIMITATIONS: carrying, lifting, bending, sitting, standing, squatting, sleeping, stairs, transfers, bed mobility, bathing, dressing, self feeding, reach over head, hygiene/grooming, and locomotion level  PARTICIPATION LIMITATIONS: meal prep, cleaning, laundry, medication management, driving, shopping, and community activity  PERSONAL FACTORS: Age, Time since onset of injury/illness/exacerbation, Transportation, and 3+ comorbidities: hx of migraines, history of cerebral  infarction, history of DVT, history of paroxysmal AF on chronic anticoagulation with warfarin (also taking Plavix), CAD (LAD stent 2021) history ANA positive, adjustment insomnia, chronic pain, history of malignant neoplasm left breast, zoster, B12 deficiency, vitamin D deficiency, osteoporosis  are also affecting patient's functional outcome.   REHAB POTENTIAL: Good  CLINICAL DECISION MAKING: Evolving/moderate complexity  EVALUATION COMPLEXITY: Moderate   GOALS: Goals reviewed with patient? Yes  SHORT TERM GOALS: Target date: 05/18/2022   Patient will be independent with initial HEP.  Baseline:  Goal status:  MET 05/26/22   Patient will have balance evaluated with appropriate goal set if deficits.  Baseline:  Goal status: Deferred   LONG TERM GOALS: Target date: 06/22/2022   Patient will be independent with advanced/ongoing HEP to improve outcomes and carryover.  Baseline:  Goal status: IN PROGRESS  2.  Patient will report 75% improvement in neck pain to improve QOL.  Baseline:  Goal status: IN PROGRESS 06/08/22, rated 6/10   3.  Patient will demonstrate 15 deg improvement in cervical ROM without pain for safety with walking.    Baseline: see objective Goal status: IN PROGRESS 06/08/22: goal met so far, much improved   4.  Patient will report 15% improvement on NDI to demonstrate improved functional ability.  Baseline: 78% impairment Goal status: IN PROGRESS 05/29/22 66% impaired 12 percent improvement  5. Patient will report 75% improvement in bil shoulder pain to improve QOL.  Baseline: severe Goal status: IN PROGRESS 06/08/22, pain rated 6/10, but improved AROM  6.  Patient to improve bil shoulder AROM to [facility] without pain provocation to allow for increased ease of ADLs.  Baseline: see objective Goal status: IN PROGRESS 06/08/22: improving, much better AROM measurements B, still difficulty moving shoulders enough to effectively fix her hair, manage reaching activities, handing  laundry, etc  7.  Patient will report <50% impairment on QuickDash to demonstrate improved functional ability.  Baseline: 95.5% impairment Goal status: IN PROGRESS 06/08/22:  NA today    PLAN:  PT FREQUENCY: 2x/week  PT DURATION: 8 weeks  PLANNED INTERVENTIONS: Therapeutic exercises, Therapeutic activity, Neuromuscular re-education, Balance training, Gait training, Patient/Family education, Self Care, Joint mobilization, Stair training, Vestibular training, Visual/preceptual remediation/compensation, Dry Needling, Electrical stimulation, Spinal mobilization, Cryotherapy, Moist heat, Taping, Ultrasound, Ionotophoresis 4mg /ml Dexamethasone, Manual therapy, and Re-evaluation  PLAN FOR NEXT SESSION:   Gentle postural exercises, isometrics as tolerated.  Manual therapy, modalities PRN.     Needs to r/s OT evaluation.    Amee Boothe L Macil Crady, PT 06/17/2022, 11:13 AM

## 2022-06-22 ENCOUNTER — Ambulatory Visit: Payer: Medicare Other | Admitting: Physical Therapy

## 2022-06-22 ENCOUNTER — Encounter: Payer: Self-pay | Admitting: Physical Therapy

## 2022-06-22 DIAGNOSIS — M25512 Pain in left shoulder: Secondary | ICD-10-CM

## 2022-06-22 DIAGNOSIS — M542 Cervicalgia: Secondary | ICD-10-CM

## 2022-06-22 DIAGNOSIS — M62830 Muscle spasm of back: Secondary | ICD-10-CM

## 2022-06-22 DIAGNOSIS — M25531 Pain in right wrist: Secondary | ICD-10-CM

## 2022-06-22 DIAGNOSIS — M6281 Muscle weakness (generalized): Secondary | ICD-10-CM

## 2022-06-22 DIAGNOSIS — M25511 Pain in right shoulder: Secondary | ICD-10-CM

## 2022-06-22 DIAGNOSIS — M25532 Pain in left wrist: Secondary | ICD-10-CM

## 2022-06-22 NOTE — Therapy (Signed)
OUTPATIENT PHYSICAL THERAPY TREATMENT Progress Note/Recert Reporting Period 4/30/204 to 06/22/2022  See note below for Objective Data and Assessment of Progress/Goals.     Patient Name: Monica Khan MRN: [id number] DOB:02/20/1945, 77 y.o., female Today's Date: 06/22/2022  END OF SESSION:  PT End of Session - 06/22/22 1018     Visit Number 12    Number of Visits 24    Date for PT Re-Evaluation 08/03/22    Authorization Type Medicare + BCBS    Progress Note Due on Visit 20    PT Start Time 1019    PT Stop Time 1102    PT Time Calculation (min) 43 min    Activity Tolerance Patient tolerated treatment well    Behavior During Therapy WFL for tasks assessed/performed                  Past Medical History:  Diagnosis Date   Breast cancer (HCC)    In remission   Fluttering heart    Gastritis    Migraines    Peripheral neuropathy    Shingles    Recurrent   Past Surgical History:  Procedure Laterality Date   ABDOMINAL HYSTERECTOMY     APPENDECTOMY     CHOLECYSTECTOMY     TOTAL MASTECTOMY     Patient Active Problem List   Diagnosis Date Noted   Wrist sprain, right, initial encounter 04/12/2022   Chondrocalcinosis of right knee 04/12/2022   Arthritis of carpometacarpal (CMC) joint of left thumb 04/12/2022   Cervical strain 04/12/2022   Concussion with no loss of consciousness 04/12/2022   Primary osteoarthritis of left shoulder 04/12/2022   Protein-calorie malnutrition, severe (HCC) 09/20/2012   Hepatocellular injury 09/17/2012   Transaminitis 09/17/2012   Nausea and vomiting 09/17/2012   Shingles 09/17/2012   History of breast cancer 09/17/2012    PCP: Herma Carson, MD  REFERRING PROVIDER: Myra Rude, MD  REFERRING DIAG: [phone] (ICD-10-CM) - Wrist sprain, right, initial encounter M11.261 (ICD-10-CM) - Chondrocalcinosis of right knee M25.532 (ICD-10-CM) - Arthralgia of left wrist S16.1XXA (ICD-10-CM) - Strain of neck muscle, initial  encounter S06.0X0A (ICD-10-CM) - Concussion without loss of consciousness, initial encounter M19.012 (ICD-10-CM) - Primary osteoarthritis of left shoulder  THERAPY DIAG:  Cervicalgia  Acute pain of right shoulder  Acute pain of left shoulder  Muscle spasm of back  Muscle weakness (generalized)  Pain in right wrist  Pain in left wrist  Rationale for Evaluation and Treatment: Rehabilitation  ONSET DATE: 03/23/2022  SUBJECTIVE:  SUBJECTIVE STATEMENT: Finally got it arranged with her son to do OT, so going to call today to arrange new eval. She put her brace on tight today to try to help with swelling and is able to move wrist better, but still having a lot of pain in her left wrist.  Not having as intense pain up her Left arm.  Has a small wound on forearm thinks spider bit her.  R wrist is not 100% but a lot better.  Occasional zings when trying to open cans, using exercise ball at home for strengthening.  Neck is hurting today.     [name] had a bad fall "it was a doozy" she fell forward and hit her head, had a concussion.  She laid out on the ground for about 20-30 min before neighbor heard her calling.  She has developed a stabbing pain in her back both sides just below shoulder blades.  She also hurt her neck, she has fluid in her L shoulder pressing on nerve endings , has a broken bone, has short term memory loss, had some from chemo, she also has a broken bone in L wrist, hairline fractures in L wrist, maybe fell a little harder on the left, but R shoulder also hurts, sprained her R wrist, but her knee is doing better not hurting as much.  They will hurt but nothing like upper body.  But my ankles, toes, and feet hurt but not as much as shoulders and neck.  Doctor told me it was like  getting hit by linebacker.  I have blinding headaches that are worse in afternoon and at night.  I have a wonderful home with long driveway, went to mailbox, was reading mail when walking and tripped over a crack.  That's how I fell.  Concussion left her dizzy and blurred vision for a week and confused, but better now.   NEXT MD VISIT: 05/03/2023 with Dr. Jordan Likes  PERTINENT HISTORY:  History of breast cancer, short term memory loss, peripheral neuropathy, stent in heart, Afib, long term anticoagulation therapy,  history of CVA, cervical spondylosis, migraines  PAIN:  Are you having pain? Yes: NPRS scale: 7/10 Pain location: L upper traps, lower L neck, Lwrist and forearm Pain description: sharp, shoot, burning, radiates Aggravating factors: moving Relieving factors: heat   PRECAUTIONS: Fall  WEIGHT BEARING RESTRICTIONS: Yes no lifting or pushing with LUE  FALLS:  Has patient fallen in last 6 months? Yes. Number of falls 1  LIVING ENVIRONMENT: Lives with: lives alone Lives in: House/apartment Stairs:  lives downstairs, had full bedroom and bathroom, avoids going upstairs now. Has 1 step up from garage, no handrail, 3 steps at front door, has handrail.   Has following equipment at home: Single point cane, Walker - 2 wheeled, Environmental consultant - 4 wheeled, Marine scientist  OCCUPATION: retired   PLOF: Independent  PATIENT GOALS: 1) get pain down better then 2) be more mobile, be able to cut food up again.   NEXT MD VISIT: 05/03/2023  OBJECTIVE:   DIAGNOSTIC FINDINGS:  03/23/22 R shoulder FINDINGS: Mild widening of the [facility] joint with relative elevation of the distal clavicle. This suggests ligamentous injury at the [facility] joint. No evidence of regional fracture. Humeral head is properly located. 03/23/22 L shoulder FINDINGS: There is no evidence of fracture or dislocation. There is no evidence of arthropathy or other focal bone abnormality. Soft tissues are unremarkable. 03/23/22 CT Cervical  Spine IMPRESSION: 1. No acute intracranial process. 2. Atrophy  with chronic microvascular ischemic changes. 3. No evidence of facial bone fracture. 4. Degenerative changes in the cervical spine without evidence of acute fracture. 03/23/22 DG Wrist R IMPRESSION: No acute or traumatic finding. Chronic degenerative changes at the first carpometacarpal joint and articulation of the multangular bones and scaphoid. 03/23/22 DG Wrist Left IMPRESSION: No acute or traumatic finding. Chronic degenerative changes at the first carpometacarpal joint. Chondrocalcinosis of the wrist joint.  04/19/22 Limited ultrasound: Left wrist pain/left shoulder pain:   Left wrist: Effusion noted within the carpal joints. There is an intense area of hyperemia at the distal radius within the joint.  There is no changes of the distal third of the radius. Degenerative changes of the scaphoid with no hyperemia. Degenerative changes of the [facility] joint.   Left shoulder: Effusion noted within the biceps tendon sheath. Chronic changes of the subscapularis. AC joint with degenerative changes. Chronic tendinopathy of the supraspinatus   Summary: Findings consistent with intra-articular distal radius fracture and effusion of the glenohumeral joint   Ultrasound and interpretation by Clare Gandy, MD  PATIENT SURVEYS:06/08/22: NDI: 66% 33/50 NDI 39/50= 78% impairment Quick Dash 95.5% impairment  COGNITION: Overall cognitive status: History of cognitive impairments - at baseline and reports short term memory loss, increased confusion and memory difficulties following recent concussion  SENSATION: Bil neuropathy in feet from chemotherapy  POSTURE: rounded shoulders and forward head  PALPATION:    CERVICAL ROM:   Active ROM AROM (deg) eval 06/08/22 Progress report  Flexion 15p! 50  Extension 10p! 50  Right lateral flexion    Left lateral flexion    Right rotation 20p! 40  Left rotation 25p! 45   (Blank rows =  not tested)  UPPER EXTREMITY ROM:  06/08/22:  assessed B shoulder movements in supine, all planes, with normal ROM flex, abd, Er, IR noted B  Active ROM Right eval Left eval 4/30 06/07/32  Shoulder flexion 80p! 60p! 110 90  Shoulder extension      Shoulder abduction 80p! 40p!    Shoulder adduction      Shoulder extension      Shoulder internal rotation Functional to R PSIS Unable to reach behind To L 3 spinous process To L3 spinous proc  Shoulder external rotation Functional to R ear/forehead Unable to lift arm  Funcitonal to post neck Functional post neck  Elbow flexion      Elbow extension      Wrist flexion      Wrist extension      Wrist ulnar deviation      Wrist radial deviation      Wrist pronation      Wrist supination       (Blank rows = not tested)  UPPER EXTREMITY MMT:  MMT Right 06/08/22 Left 06/08/22  Shoulder flexion 3 4  Shoulder extension 4 4  Shoulder abduction 4 4  Shoulder adduction 4 4  Shoulder extension 4 4  Shoulder internal rotation 4 4  Shoulder external rotation 4 4  Middle trapezius 3- 3-  Lower trapezius 3- 3-  Elbow flexion 4 4  Elbow extension 4 4  Wrist flexion    Wrist extension    Wrist ulnar deviation    Wrist radial deviation    Wrist pronation    Wrist supination    Grip strength     (Blank rows = not tested)   FUNCTIONAL TESTS:  5 times sit to stand: 30 seconds without UE assist  TODAY'S TREATMENT:  06/22/2022 Therapeutic Activity:  assessing progress towards goals QuickDash NDI MHP while supine to cervical region to allow muscle relaxation concurrent with subjective portions.   Therapeutic Exercise: to improve strength and mobility.  Demo, verbal and tactile cues throughout for technique. Supine chest press with 1# weight bar x 20 Supine Shoulder IR/ER with 1# weight bar x 20  Supine Shoulder AROM flexion with 1# weight bar x 20 - cues not to bounce at end range HEP update  06/17/22:  Moist heat prior to  manual  technique and therex for 8 min, not included in treatment time  Therex:  Manual:   Supine for manual stretching, intermittent bouts of manual stretch and contract /relax to improve tissue mobility, pain, muscle spasm, B upper cervical spine, B upper traps, B levator scapulae. Also intermittent cervical distraction  Therex:  - Supine hands behind head for shoulder horizontal add/abd (reverse butterflys) -Supine for isometric horizontal shoulder abduction with strap around wrists, combined with B shoulder flexion, to engage lower and middle traps  -supine belt around wrists for isometric shoulder ER 5 sec holds, 10 reps -supine holding yard stick B hands for chest press then serratus punches, multiple verbal, tactile cues to avoid shrugging shoulders, improved with repetition -standing for isometric chin tuck with ball behind head for cervical rotation  -standing facing wall for B shoulder flexion, wall slides up wall, with stretch hold to tolerance end range.                                                                                                                     06/15/22 Therapeutic Exercise: to improve strength and mobility.  Supine exercises: Chin tucks 2x10 3" Isometric shoulder ER B 10x with belt around wrist Isometric horiz ABD with belt arms arm 10x B shoulder flexion x 10 - mild pain on L side Manual Therapy: to decrease muscle spasm and pain and improve mobility STM to L UT, LS, cervical paraspinals in supine  06/08/22: Reassessed patient for progress note Therapeutic exercise:  focused today on middle and lower traps, periscapular, and spine extensor strength, did not grasp anything due to ongoing L wrist and hand pain/deformity Supine for manual stretching each levator scapulae by PT, 5 reps, 5 to 8 sec holds, followed by isometric holds by patient 5 sec, 5 reps each  Supine hands behind head for shoulder horizontal add/abd (reverse butterflys) " As above with theraband  around elbows  Supine for isometric horizontal shoulder abduction with strap around wrists, combined with B shoulder flexion, to engage lower and middle traps Supine for yellow t band ER, shoulders adducted by trunk, 15 reps Bridging 10 x . Cues to achieve flat back   06/01/22 Therapeutic Exercise: to improve strength and mobility.  Demo, verbal and tactile cues throughout for technique. Table slides flexion and adduction x 10  Gentle cervical rotation x 10  Manual Therapy: to decrease muscle spasm and pain and improve mobility STM/TPR to L UT, L/S, cervical paraspinals   05/26/22 Therapeutic Exercise: to improve  strength and mobility.  Demo, verbal and tactile cues throughout for technique. Table slides flexion x 10, scaption x 10 Gentle cervical AROM and levator stretch Manual Therapy: to decrease muscle spasm and pain and improve mobility STM/TPR to L UT, L/S, cervical paraspinals, Kinesiotaping to L wrist using K-tape using 1 strip from L thumb to L lateral epiconyle, strip across wrist to anchor, and another anchoring at epicondyle.    Modalities: MHP to neck in sitting x 10 min (not included in treatment time)  05/19/22 Therapeutic Exercise: to improve strength and mobility.  Demo, verbal and tactile cues throughout for technique. Review of HEP Manual Therapy: to decrease muscle spasm and pain and improve mobility STM/TPR to L UT, L/S, cervical paraspinals, gentle retrograde massage to improve lymphatic drainage in LUE, Kinesiotaping to L wrist using K-tape using 1 strip from L thumb to L lateral epiconyle, and x-strip to base of thumb to decrease pain.    Modalities: MHP to neck in sitting x 10 min (not included in treatment time)   PATIENT EDUCATION:  Education details: HEP update Person educated: Patient Education method: Explanation, Demonstration, Verbal cues, and Handouts Education comprehension: verbalized understanding and returned demonstration  HOME EXERCISE  PROGRAM: Access Code: [email or web address] URL: [email or web address] Date: 06/22/2022 Prepared by: Harrie Foreman  Exercises - Seated Shoulder Flexion Towel Slide at Table Top  - 2 x daily - 7 x weekly - 1 sets - 10 reps - Seated Shoulder Scaption Slide at Table Top with Forearm in Neutral  - 2 x daily - 7 x weekly - 1 sets - 10 reps - Gentle Levator Scapulae Stretch  - 2 x daily - 7 x weekly - 1 sets - 3 reps - 10 sec  hold - Seated Cervical Rotation AROM  - 2 x daily - 7 x weekly - 1 sets - 10 reps - Supine Shoulder Press AAROM in Abduction with Dowel  - 1 x daily - 7 x weekly - 2 sets - 10 reps - Supine Bicep Curl with Cane  - 1 x daily - 7 x weekly - 2 sets - 10 reps - Supine Shoulder Flexion AAROM with Dowel  - 1 x daily - 7 x weekly - 2 sets - 10 reps - Supine Chin Tuck on Pillow  - 1 x daily - 7 x weekly - 2 sets - 10 reps  ASSESSMENT:  CLINICAL IMPRESSION: [place] is making progress but has been limited due to severity and extent of original injuries and poor tolerance to exercise, but today reports only 63.6% impairment on QuickDash compared to 95.5% impairment on initial evaluation.  She still has neck pain as well, but is more aware of posture and is tolerated postural exercises, and NDI continues to improve.  She is willing now to be evaluated by OT for continued wrist pain.   She is starting to tolerate more exercise now.  Today tolerated AAROM with bar with wrist splint on L wrist but still fatigued quickly.  Updated HEP.  She would benefit from continued skilled therapy 1-2x/week for additional 6 weeks, with balance also to be evaluated and addressed,  especially as she reports she is now very fearful of falling again.  Will continue to also focus on posture and activity tolerance, deferring wrist pain to OT.  Rannie Ruis continues to demonstrate potential for improvement and would benefit from continued skilled therapy to address impairments.     OBJECTIVE IMPAIRMENTS:  decreased activity tolerance, decreased balance, decreased endurance, decreased mobility, difficulty  walking, decreased ROM, decreased strength, decreased safety awareness, dizziness, increased fascial restrictions, increased muscle spasms, impaired sensation, impaired UE functional use, postural dysfunction, and pain.   ACTIVITY LIMITATIONS: carrying, lifting, bending, sitting, standing, squatting, sleeping, stairs, transfers, bed mobility, bathing, dressing, self feeding, reach over head, hygiene/grooming, and locomotion level  PARTICIPATION LIMITATIONS: meal prep, cleaning, laundry, medication management, driving, shopping, and community activity  PERSONAL FACTORS: Age, Time since onset of injury/illness/exacerbation, Transportation, and 3+ comorbidities: hx of migraines, history of cerebral infarction, history of DVT, history of paroxysmal AF on chronic anticoagulation with warfarin (also taking Plavix), CAD (LAD stent 2021) history ANA positive, adjustment insomnia, chronic pain, history of malignant neoplasm left breast, zoster, B12 deficiency, vitamin D deficiency, osteoporosis  are also affecting patient's functional outcome.   REHAB POTENTIAL: Good  CLINICAL DECISION MAKING: Evolving/moderate complexity  EVALUATION COMPLEXITY: Moderate   GOALS: Goals reviewed with patient? Yes  SHORT TERM GOALS: Target date: 05/18/2022   Patient will be independent with initial HEP.  Baseline:  Goal status: MET 05/26/22   Patient will have balance evaluated with appropriate goal set if deficits.  Baseline:  Goal status: Deferred   LONG TERM GOALS: Target date: 06/22/2022 extended to 08/03/2022  Patient will be independent with advanced/ongoing HEP to improve outcomes and carryover.  Baseline:  Goal status: IN PROGRESS   2.  Patient will report 75% improvement in neck pain to improve QOL.  Baseline:  Goal status: IN PROGRESS 06/22/22- 30-40% improvement.   3.  Patient will demonstrate 15 deg  improvement in cervical ROM without pain for safety with walking.    Baseline: see objective Goal status: IN PROGRESS 06/08/22: goal met so far, much improved   4.  Patient will report 15% improvement on NDI to demonstrate improved functional ability.  Baseline: 78% impairment Goal status: IN PROGRESS 05/29/22 66% impaired 12 percent improvement 06/22/22- 64%  5. Patient will report 75% improvement in bil shoulder pain to improve QOL.  Baseline: severe Goal status: IN PROGRESS 06/22/22- 70% improvement on R, 40% on L  6.  Patient to improve bil shoulder AROM to [facility] without pain provocation to allow for increased ease of ADLs.  Baseline: see objective Goal status: IN PROGRESS 06/08/22: improving, much better AROM measurements B, still difficulty moving shoulders enough to effectively fix her hair, manage reaching activities, handing laundry, etc  7.  Patient will report <50% impairment on QuickDash to demonstrate improved functional ability.  Baseline: 95.5% impairment Goal status: IN PROGRESS 06/22/22- 63.6%  8.  Patient will demonstrate at least 19/24 on DGI to demonstrate lower risk of falls.  Baseline: NT Goal status: INITIAL  9.  Patient will report 75% improvement in balance confidence.  Baseline: extremely fearful of falling again.  Goal status: INITIAL   PLAN:  PT FREQUENCY: 2x/week  PT DURATION: 6 weeks to 08/03/2022  PLANNED INTERVENTIONS: Therapeutic exercises, Therapeutic activity, Neuromuscular re-education, Balance training, Gait training, Patient/Family education, Self Care, Joint mobilization, Stair training, Vestibular training, Visual/preceptual remediation/compensation, Dry Needling, Electrical stimulation, Spinal mobilization, Cryotherapy, Moist heat, Taping, Ultrasound, Ionotophoresis 4mg /ml Dexamethasone, Manual therapy, and Re-evaluation  PLAN FOR NEXT SESSION:   Gentle postural exercises, isometrics as tolerated.  Manual therapy, modalities PRN.   Assess balance  (DGI) and update goal.   Needs to r/s OT evaluation.    Jena Gauss, PT, DPT  06/22/2022, 12:02 PM

## 2022-06-24 ENCOUNTER — Ambulatory Visit: Payer: Medicare Other

## 2022-06-24 DIAGNOSIS — M542 Cervicalgia: Secondary | ICD-10-CM | POA: Diagnosis not present

## 2022-06-24 DIAGNOSIS — M25532 Pain in left wrist: Secondary | ICD-10-CM

## 2022-06-24 DIAGNOSIS — M6281 Muscle weakness (generalized): Secondary | ICD-10-CM

## 2022-06-24 DIAGNOSIS — M62830 Muscle spasm of back: Secondary | ICD-10-CM

## 2022-06-24 DIAGNOSIS — M25512 Pain in left shoulder: Secondary | ICD-10-CM

## 2022-06-24 DIAGNOSIS — M25511 Pain in right shoulder: Secondary | ICD-10-CM

## 2022-06-24 DIAGNOSIS — M25531 Pain in right wrist: Secondary | ICD-10-CM

## 2022-06-24 NOTE — Therapy (Addendum)
OUTPATIENT PHYSICAL THERAPY TREATMENT     Patient Name: Monica Khan MRN: [id number] DOB:[birth year], 77 y.o., female Today's Date: 06/24/2022  END OF SESSION:  PT End of Session - 06/24/22 1023     Visit Number 13    Number of Visits 24    Date for PT Re-Evaluation 08/03/22    Authorization Type Medicare + BCBS    Progress Note Due on Visit 20    PT Start Time 1015    PT Stop Time 1100    PT Time Calculation (min) 45 min    Activity Tolerance Patient tolerated treatment well    Behavior During Therapy WFL for tasks assessed/performed                   Past Medical History:  Diagnosis Date   Breast cancer (HCC)    In remission   Fluttering heart    Gastritis    Migraines    Peripheral neuropathy    Shingles    Recurrent   Past Surgical History:  Procedure Laterality Date   ABDOMINAL HYSTERECTOMY     APPENDECTOMY     CHOLECYSTECTOMY     TOTAL MASTECTOMY     Patient Active Problem List   Diagnosis Date Noted   Wrist sprain, right, initial encounter 04/12/2022   Chondrocalcinosis of right knee 04/12/2022   Arthritis of carpometacarpal (CMC) joint of left thumb 04/12/2022   Cervical strain 04/12/2022   Concussion with no loss of consciousness 04/12/2022   Primary osteoarthritis of left shoulder 04/12/2022   Protein-calorie malnutrition, severe (HCC) 09/20/2012   Hepatocellular injury 09/17/2012   Transaminitis 09/17/2012   Nausea and vomiting 09/17/2012   Shingles 09/17/2012   History of breast cancer 09/17/2012    PCP: Herma Carson, MD  REFERRING PROVIDER: Myra Rude, MD  REFERRING DIAG: [phone] (ICD-10-CM) - Wrist sprain, right, initial encounter M11.261 (ICD-10-CM) - Chondrocalcinosis of right knee M25.532 (ICD-10-CM) - Arthralgia of left wrist S16.1XXA (ICD-10-CM) - Strain of neck muscle, initial encounter S06.0X0A (ICD-10-CM) - Concussion without loss of consciousness, initial encounter M19.012 (ICD-10-CM) - Primary osteoarthritis  of left shoulder  THERAPY DIAG:  Cervicalgia  Acute pain of right shoulder  Acute pain of left shoulder  Muscle spasm of back  Muscle weakness (generalized)  Pain in right wrist  Pain in left wrist  Rationale for Evaluation and Treatment: Rehabilitation  ONSET DATE: 03/23/2022  SUBJECTIVE:  SUBJECTIVE STATEMENT: Pt notes feeling more limber and able to do more around the house (putting sheets on bed and taking clothes to/from washer/drying).   [name] had a bad fall "it was a doozy" she fell forward and hit her head, had a concussion.  She laid out on the ground for about 20-30 min before neighbor heard her calling.  She has developed a stabbing pain in her back both sides just below shoulder blades.  She also hurt her neck, she has fluid in her L shoulder pressing on nerve endings , has a broken bone, has short term memory loss, had some from chemo, she also has a broken bone in L wrist, hairline fractures in L wrist, maybe fell a little harder on the left, but R shoulder also hurts, sprained her R wrist, but her knee is doing better not hurting as much.  They will hurt but nothing like upper body.  But my ankles, toes, and feet hurt but not as much as shoulders and neck.  Doctor told me it was like getting hit by linebacker.  I have blinding headaches that are worse in afternoon and at night.  I have a wonderful home with long driveway, went to mailbox, was reading mail when walking and tripped over a crack.  That's how I fell.  Concussion left her dizzy and blurred vision for a week and confused, but better now.   NEXT MD VISIT: 05/03/2023 with Dr. Jordan Likes  PERTINENT HISTORY:  History of breast cancer, short term memory loss, peripheral neuropathy, stent in heart, Afib, long term  anticoagulation therapy,  history of CVA, cervical spondylosis, migraines  PAIN:  Are you having pain? Yes: NPRS scale: 6/10 Pain location: L upper traps, lower L neck, Lwrist and forearm Pain description: sharp, shoot, burning, radiates Aggravating factors: moving Relieving factors: heat   PRECAUTIONS: Fall  WEIGHT BEARING RESTRICTIONS: Yes no lifting or pushing with LUE  FALLS:  Has patient fallen in last 6 months? Yes. Number of falls 1  LIVING ENVIRONMENT: Lives with: lives alone Lives in: House/apartment Stairs:  lives downstairs, had full bedroom and bathroom, avoids going upstairs now. Has 1 step up from garage, no handrail, 3 steps at front door, has handrail.   Has following equipment at home: Single point cane, Walker - 2 wheeled, Environmental consultant - 4 wheeled, Marine scientist  OCCUPATION: retired   PLOF: Independent  PATIENT GOALS: 1) get pain down better then 2) be more mobile, be able to cut food up again.   NEXT MD VISIT: 05/03/2023  OBJECTIVE:   DIAGNOSTIC FINDINGS:  03/23/22 R shoulder FINDINGS: Mild widening of the [facility] joint with relative elevation of the distal clavicle. This suggests ligamentous injury at the [facility] joint. No evidence of regional fracture. Humeral head is properly located. 03/23/22 L shoulder FINDINGS: There is no evidence of fracture or dislocation. There is no evidence of arthropathy or other focal bone abnormality. Soft tissues are unremarkable. 03/23/22 CT Cervical Spine IMPRESSION: 1. No acute intracranial process. 2. Atrophy with chronic microvascular ischemic changes. 3. No evidence of facial bone fracture. 4. Degenerative changes in the cervical spine without evidence of acute fracture. 03/23/22 DG Wrist R IMPRESSION: No acute or traumatic finding. Chronic degenerative changes at the first carpometacarpal joint and articulation of the multangular bones and scaphoid. 03/23/22 DG Wrist Left IMPRESSION: No acute or traumatic finding. Chronic  degenerative changes at the first carpometacarpal joint. Chondrocalcinosis of the wrist joint.  04/19/22 Limited ultrasound: Left wrist pain/left shoulder  pain:   Left wrist: Effusion noted within the carpal joints. There is an intense area of hyperemia at the distal radius within the joint.  There is no changes of the distal third of the radius. Degenerative changes of the scaphoid with no hyperemia. Degenerative changes of the [facility] joint.   Left shoulder: Effusion noted within the biceps tendon sheath. Chronic changes of the subscapularis. AC joint with degenerative changes. Chronic tendinopathy of the supraspinatus   Summary: Findings consistent with intra-articular distal radius fracture and effusion of the glenohumeral joint   Ultrasound and interpretation by Clare Gandy, MD  PATIENT SURVEYS:06/08/22: NDI: 66% 33/50 NDI 39/50= 78% impairment Quick Dash 95.5% impairment  COGNITION: Overall cognitive status: History of cognitive impairments - at baseline and reports short term memory loss, increased confusion and memory difficulties following recent concussion  SENSATION: Bil neuropathy in feet from chemotherapy  POSTURE: rounded shoulders and forward head  PALPATION:    CERVICAL ROM:   Active ROM AROM (deg) eval 06/08/22 Progress report  Flexion 15p! 50  Extension 10p! 50  Right lateral flexion    Left lateral flexion    Right rotation 20p! 40  Left rotation 25p! 45   (Blank rows = not tested)  UPPER EXTREMITY ROM:  06/08/22:  assessed B shoulder movements in supine, all planes, with normal ROM flex, abd, Er, IR noted B  Active ROM Right eval Left eval 4/30 06/07/32  Shoulder flexion 80p! 60p! 110 90  Shoulder extension      Shoulder abduction 80p! 40p!    Shoulder adduction      Shoulder extension      Shoulder internal rotation Functional to R PSIS Unable to reach behind To L 3 spinous process To L3 spinous proc  Shoulder external rotation Functional to R  ear/forehead Unable to lift arm  Funcitonal to post neck Functional post neck  Elbow flexion      Elbow extension      Wrist flexion      Wrist extension      Wrist ulnar deviation      Wrist radial deviation      Wrist pronation      Wrist supination       (Blank rows = not tested)  UPPER EXTREMITY MMT:  MMT Right 06/08/22 Left 06/08/22  Shoulder flexion 3 4  Shoulder extension 4 4  Shoulder abduction 4 4  Shoulder adduction 4 4  Shoulder extension 4 4  Shoulder internal rotation 4 4  Shoulder external rotation 4 4  Middle trapezius 3- 3-  Lower trapezius 3- 3-  Elbow flexion 4 4  Elbow extension 4 4  Wrist flexion    Wrist extension    Wrist ulnar deviation    Wrist radial deviation    Wrist pronation    Wrist supination    Grip strength     (Blank rows = not tested)   FUNCTIONAL TESTS:  5 times sit to stand: 30 seconds without UE assist  TODAY'S TREATMENT:  06/24/22 Therapeutic Exercise: to improve strength and mobility.  Demo, verbal and tactile cues throughout for technique. Supine chin tucks 15x Supine shoulder flexion with 1# weight bar and 1# cuff weight x 10 Supine chest press with 1# weight bar and 1# cuff weight x 10 Supine ER/IR with 1# weight bar and 1# cuff weight x 10 Supine B ER with yellow TB x10  Manual Therapy: to decrease muscle spasm and pain and improve mobility STM/TPR to L UT, L/S, cervical  paraspinals  06/22/2022 Therapeutic Activity:  assessing progress towards goals QuickDash NDI MHP while supine to cervical region to allow muscle relaxation concurrent with subjective portions.   Therapeutic Exercise: to improve strength and mobility.  Demo, verbal and tactile cues throughout for technique. Supine chest press with 1# weight bar x 20 Supine Shoulder IR/ER with 1# weight bar x 20  Supine Shoulder AROM flexion with 1# weight bar x 20 - cues not to bounce at end range HEP update  06/17/22:  Moist heat prior to  manual technique and  therex for 8 min, not included in treatment time  Therex:  Manual:   Supine for manual stretching, intermittent bouts of manual stretch and contract /relax to improve tissue mobility, pain, muscle spasm, B upper cervical spine, B upper traps, B levator scapulae. Also intermittent cervical distraction  Therex:  - Supine hands behind head for shoulder horizontal add/abd (reverse butterflys) -Supine for isometric horizontal shoulder abduction with strap around wrists, combined with B shoulder flexion, to engage lower and middle traps  -supine belt around wrists for isometric shoulder ER 5 sec holds, 10 reps -supine holding yard stick B hands for chest press then serratus punches, multiple verbal, tactile cues to avoid shrugging shoulders, improved with repetition -standing for isometric chin tuck with ball behind head for cervical rotation  -standing facing wall for B shoulder flexion, wall slides up wall, with stretch hold to tolerance end range.                                                                                                                     06/15/22 Therapeutic Exercise: to improve strength and mobility.  Supine exercises: Chin tucks 2x10 3" Isometric shoulder ER B 10x with belt around wrist Isometric horiz ABD with belt arms arm 10x B shoulder flexion x 10 - mild pain on L side Manual Therapy: to decrease muscle spasm and pain and improve mobility STM to L UT, LS, cervical paraspinals in supine  06/08/22: Reassessed patient for progress note Therapeutic exercise:  focused today on middle and lower traps, periscapular, and spine extensor strength, did not grasp anything due to ongoing L wrist and hand pain/deformity Supine for manual stretching each levator scapulae by PT, 5 reps, 5 to 8 sec holds, followed by isometric holds by patient 5 sec, 5 reps each  Supine hands behind head for shoulder horizontal add/abd (reverse butterflys) " As above with theraband around elbows   Supine for isometric horizontal shoulder abduction with strap around wrists, combined with B shoulder flexion, to engage lower and middle traps Supine for yellow t band ER, shoulders adducted by trunk, 15 reps Bridging 10 x . Cues to achieve flat back   06/01/22 Therapeutic Exercise: to improve strength and mobility.  Demo, verbal and tactile cues throughout for technique. Table slides flexion and adduction x 10  Gentle cervical rotation x 10  Manual Therapy: to decrease muscle spasm and pain and improve mobility STM/TPR to L UT, L/S, cervical paraspinals  05/26/22 Therapeutic Exercise: to improve strength and mobility.  Demo, verbal and tactile cues throughout for technique. Table slides flexion x 10, scaption x 10 Gentle cervical AROM and levator stretch Manual Therapy: to decrease muscle spasm and pain and improve mobility STM/TPR to L UT, L/S, cervical paraspinals, Kinesiotaping to L wrist using K-tape using 1 strip from L thumb to L lateral epiconyle, strip across wrist to anchor, and another anchoring at epicondyle.    Modalities: MHP to neck in sitting x 10 min (not included in treatment time)  05/19/22 Therapeutic Exercise: to improve strength and mobility.  Demo, verbal and tactile cues throughout for technique. Review of HEP Manual Therapy: to decrease muscle spasm and pain and improve mobility STM/TPR to L UT, L/S, cervical paraspinals, gentle retrograde massage to improve lymphatic drainage in LUE, Kinesiotaping to L wrist using K-tape using 1 strip from L thumb to L lateral epiconyle, and x-strip to base of thumb to decrease pain.    Modalities: MHP to neck in sitting x 10 min (not included in treatment time)   PATIENT EDUCATION:  Education details: HEP update Person educated: Patient Education method: Explanation, Demonstration, Verbal cues, and Handouts Education comprehension: verbalized understanding and returned demonstration  HOME EXERCISE PROGRAM: Access Code:  [email or web address] URL: [email or web address] Date: 06/22/2022 Prepared by: Harrie Foreman  Exercises - Seated Shoulder Flexion Towel Slide at Table Top  - 2 x daily - 7 x weekly - 1 sets - 10 reps - Seated Shoulder Scaption Slide at Table Top with Forearm in Neutral  - 2 x daily - 7 x weekly - 1 sets - 10 reps - Gentle Levator Scapulae Stretch  - 2 x daily - 7 x weekly - 1 sets - 3 reps - 10 sec  hold - Seated Cervical Rotation AROM  - 2 x daily - 7 x weekly - 1 sets - 10 reps - Supine Shoulder Press AAROM in Abduction with Dowel  - 1 x daily - 7 x weekly - 2 sets - 10 reps - Supine Bicep Curl with Cane  - 1 x daily - 7 x weekly - 2 sets - 10 reps - Supine Shoulder Flexion AAROM with Dowel  - 1 x daily - 7 x weekly - 2 sets - 10 reps - Supine Chin Tuck on Pillow  - 1 x daily - 7 x weekly - 2 sets - 10 reps  ASSESSMENT:  CLINICAL IMPRESSION: Continued working with supine exercises to promote muscle relaxation and comfort. Progressed to 2# with supine chest press and shoulder flexion. Pt notes improved ability to carry clothes and putting sheets on bed. Leathie Weich continues to demonstrate potential for improvement and would benefit from continued skilled therapy to address impairments.     OBJECTIVE IMPAIRMENTS: decreased activity tolerance, decreased balance, decreased endurance, decreased mobility, difficulty walking, decreased ROM, decreased strength, decreased safety awareness, dizziness, increased fascial restrictions, increased muscle spasms, impaired sensation, impaired UE functional use, postural dysfunction, and pain.   ACTIVITY LIMITATIONS: carrying, lifting, bending, sitting, standing, squatting, sleeping, stairs, transfers, bed mobility, bathing, dressing, self feeding, reach over head, hygiene/grooming, and locomotion level  PARTICIPATION LIMITATIONS: meal prep, cleaning, laundry, medication management, driving, shopping, and community activity  PERSONAL FACTORS: Age,  Time since onset of injury/illness/exacerbation, Transportation, and 3+ comorbidities: hx of migraines, history of cerebral infarction, history of DVT, history of paroxysmal AF on chronic anticoagulation with warfarin (also taking Plavix), CAD (LAD stent 2021) history ANA positive, adjustment insomnia, chronic pain, history of malignant neoplasm left  breast, zoster, B12 deficiency, vitamin D deficiency, osteoporosis  are also affecting patient's functional outcome.   REHAB POTENTIAL: Good  CLINICAL DECISION MAKING: Evolving/moderate complexity  EVALUATION COMPLEXITY: Moderate   GOALS: Goals reviewed with patient? Yes  SHORT TERM GOALS: Target date: 05/18/2022   Patient will be independent with initial HEP.  Baseline:  Goal status: MET 05/26/22   Patient will have balance evaluated with appropriate goal set if deficits.  Baseline:  Goal status: Deferred   LONG TERM GOALS: Target date: 06/22/2022 extended to 08/03/2022  Patient will be independent with advanced/ongoing HEP to improve outcomes and carryover.  Baseline:  Goal status: IN PROGRESS   2.  Patient will report 75% improvement in neck pain to improve QOL.  Baseline:  Goal status: IN PROGRESS 06/22/22- 30-40% improvement.   3.  Patient will demonstrate 15 deg improvement in cervical ROM without pain for safety with walking.    Baseline: see objective Goal status: IN PROGRESS 06/08/22: goal met so far, much improved   4.  Patient will report 15% improvement on NDI to demonstrate improved functional ability.  Baseline: 78% impairment Goal status: IN PROGRESS 05/29/22 66% impaired 12 percent improvement 06/22/22- 64%  5. Patient will report 75% improvement in bil shoulder pain to improve QOL.  Baseline: severe Goal status: IN PROGRESS 06/22/22- 70% improvement on R, 40% on L  6.  Patient to improve bil shoulder AROM to [facility] without pain provocation to allow for increased ease of ADLs.  Baseline: see objective Goal status: IN  PROGRESS 06/08/22: improving, much better AROM measurements B, still difficulty moving shoulders enough to effectively fix her hair, manage reaching activities, handing laundry, etc  7.  Patient will report <50% impairment on QuickDash to demonstrate improved functional ability.  Baseline: 95.5% impairment Goal status: IN PROGRESS 06/22/22- 63.6%  8.  Patient will demonstrate at least 19/24 on DGI to demonstrate lower risk of falls.  Baseline: NT Goal status: INITIAL  9.  Patient will report 75% improvement in balance confidence.  Baseline: extremely fearful of falling again.  Goal status: INITIAL   PLAN:  PT FREQUENCY: 2x/week  PT DURATION: 6 weeks to 08/03/2022  PLANNED INTERVENTIONS: Therapeutic exercises, Therapeutic activity, Neuromuscular re-education, Balance training, Gait training, Patient/Family education, Self Care, Joint mobilization, Stair training, Vestibular training, Visual/preceptual remediation/compensation, Dry Needling, Electrical stimulation, Spinal mobilization, Cryotherapy, Moist heat, Taping, Ultrasound, Ionotophoresis 4mg /ml Dexamethasone, Manual therapy, and Re-evaluation  PLAN FOR NEXT SESSION:   Gentle postural exercises, isometrics as tolerated.  Manual therapy, modalities PRN.   Assess balance (DGI) and update goal.   Needs to r/s OT evaluation.    Darleene Cleaver, PTA 06/24/2022, 12:13 PM  PHYSICAL THERAPY DISCHARGE SUMMARY  Visits from Start of Care: 13  Current functional level related to goals / functional outcomes: 63.6% impairment on QuickDash compared to 95.5% impairment on initial evaluation.   More aware of posture and is tolerated postural exercises, and NDI continues to improve.    Remaining deficits: Continued neck pain and bil arm, wrist pain   Education / Equipment: HEP  Plan: Patient is being discharged due to not attending physical therapy after 06/24/2022.  She would need new order to continue PT at this time since it has been more  than 30+ days.       Jena Gauss, PT  09/06/2022 3:58 PM

## 2022-06-24 NOTE — Therapy (Unsigned)
OUTPATIENT OCCUPATIONAL THERAPY ORTHO EVALUATION  Patient Name: Monica Khan MRN: [id number] DOB:06/15/1945, 77 y.o., female Today's Date: 06/30/2022  PCP: Dr. Roney Mans REFERRING PROVIDER: Dr. Jordan Likes  END OF SESSION:  OT End of Session - 06/29/22 0856     Visit Number 1    Number of Visits 25    Date for OT Re-Evaluation 09/21/22    Authorization Type Medicare    Authorization - Visit Number 1    Authorization - Number of Visits 10    OT Start Time 0848    OT Stop Time 0930    OT Time Calculation (min) 42 min    Activity Tolerance Patient tolerated treatment well    Behavior During Therapy [facility] for tasks assessed/performed             Past Medical History:  Diagnosis Date   Breast cancer (HCC)    In remission   Fluttering heart    Gastritis    Migraines    Peripheral neuropathy    Shingles    Recurrent   Past Surgical History:  Procedure Laterality Date   ABDOMINAL HYSTERECTOMY     APPENDECTOMY     CHOLECYSTECTOMY     TOTAL MASTECTOMY     Patient Active Problem List   Diagnosis Date Noted   Wrist sprain, right, initial encounter 04/12/2022   Chondrocalcinosis of right knee 04/12/2022   Arthritis of carpometacarpal (CMC) joint of left thumb 04/12/2022   Cervical strain 04/12/2022   Concussion with no loss of consciousness 04/12/2022   Primary osteoarthritis of left shoulder 04/12/2022   Protein-calorie malnutrition, severe (HCC) 09/20/2012   Hepatocellular injury 09/17/2012   Transaminitis 09/17/2012   Nausea and vomiting 09/17/2012   Shingles 09/17/2012   History of breast cancer 09/17/2012    ONSET DATE: referral 05/12/22  REFERRING DIAG: M18.12 (ICD-10-CM) - Arthritis of carpometacarpal (CMC) joint of left thumb   THERAPY DIAG:  Pain in right wrist - Plan: Ot plan of care cert/re-cert  Muscle weakness (generalized) - Plan: Ot plan of care cert/re-cert  Pain in left hand - Plan: Ot plan of care cert/re-cert  Localized edema - Plan: Ot plan of  care cert/re-cert  Stiffness of left wrist, not elsewhere classified - Plan: Ot plan of care cert/re-cert  Stiffness of left hand, not elsewhere classified - Plan: Ot plan of care cert/re-cert  Rationale for Evaluation and Treatment: Rehabilitation  SUBJECTIVE:   SUBJECTIVE STATEMENT: Pt wants to be able to use L hand without pain Pt accompanied by: self  PERTINENT HISTORY: Fall 03/23/22, History of breast cancer, short term memory loss, peripheral neuropathy, stent in heart, Afib, long term anticoagulation therapy,  history of CVA, cervical spondylosis, migraines    DIAGNOSTIC FINDINGS:  03/23/22 R shoulder FINDINGS: Mild widening of the [facility] joint with relative elevation of the distal clavicle. This suggests ligamentous injury at the [facility] joint. No evidence of regional fracture. Humeral head is properly located. 03/23/22 L shoulder FINDINGS: There is no evidence of fracture or dislocation. There is no evidence of arthropathy or other focal bone abnormality. Soft tissues are unremarkable. 03/23/22 CT Cervical Spine IMPRESSION: 1. No acute intracranial process. 2. Atrophy with chronic microvascular ischemic changes. 3. No evidence of facial bone fracture. 4. Degenerative changes in the cervical spine without evidence of acute fracture. 03/23/22 DG Wrist R IMPRESSION: No acute or traumatic finding. Chronic degenerative changes at the first carpometacarpal joint and articulation of the multangular bones and scaphoid. 03/23/22 DG Wrist Left IMPRESSION: No acute or traumatic  finding. Chronic degenerative changes at the first carpometacarpal joint. Chondrocalcinosis of the wrist joint.   04/19/22 Limited ultrasound: Left wrist pain/left shoulder pain:   Left wrist: Effusion noted within the carpal joints. There is an intense area of hyperemia at the distal radius within the joint.  There is no changes of the distal third of the radius. Degenerative changes of the scaphoid with no  hyperemia. Degenerative changes of the [facility] joint.   Left shoulder: Effusion noted within the biceps tendon sheath. Chronic changes of the subscapularis. AC joint with degenerative changes. Chronic tendinopathy of the supraspinatus   Summary: Findings consistent with intra-articular distal radius fracture and effusion of the glenohumeral joint   Ultrasound and interpretation by Clare Gandy, MD PAIN:  Are you having pain? Yes:  PRECAUTIONS: Fall  WEIGHT BEARING RESTRICTIONS: limited by pain, no heavy use of L hand  PAIN:  Are you having pain? Yes: NPRS scale: 6/10 Pain location:  L thumb and wrist and index finger Pain description: aching Aggravating factors: use, abduction, lifting Relieving factors: pain meds  FALLS: Has patient fallen in last 6 months? Yes. Number of falls 1  LIVING ENVIRONMENT: Lives with: lives alone Lives in: House/apartment PLOF: Independent  PATIENT GOALS: improve use of LUE  NEXT MD VISIT: TBD  OBJECTIVE:   HAND DOMINANCE: Right  ADLs: Overall ADLs: mod I with alll basic ADLS using primarily RUE Transfers/ambulation related to ADLs:mod I   FUNCTIONAL OUTCOME MEASURES: Upper Extremity Functional Scale (UEFS): 17/80, MCR rating 79% impairment (max function:21%)  UPPER EXTREMITY ROM:       Active ROM Right eval Left eval  Thumb MCP (0-60)  10  Thumb IP (0-80)  25  Thumb Radial abd/add (0-55)  40   Thumb Palmar abd/add (0-45)     Thumb Opposition to Small Finger  Opposes all with pain   Index MCP (0-90)   70  Index PIP (0-100)   70  Index DIP (0-70)      Long MCP (0-90)   70   Long PIP (0-100)   70   Long DIP (0-70)      Ring MCP (0-90)   55   Ring PIP (0-100)    75  Ring DIP (0-70)      Little MCP (0-90)    55  Little PIP (0-100)    75  Little DIP (0-70)      (Blank rows = not tested)   HAND FUNCTION: Grip strength: Right: 23 lbs; Left: 5 lbs  COORDINATION: NT  SENSATION: Light touch: Impaired  for  LUE  EDEMA: mild in thumb and wrist  COGNITION: Overall cognitive status: Impaired Areas of impairment: Short term memory  OBSERVATIONS: Pt is point tender at her wrist and thumb CMC joint   TODAY'S TREATMENT:  DATE: 06/29/22- Paraffin applied to left wrist and hand x 8 mins for pain and stiffness. No adverse reactions. Pt was instructed in initial HEP. See pt instructions.    PATIENT EDUCATION: Education details: initial HEP. Person educated: Patient Education method: Explanation Education comprehension: verbalized understanding, returned demonstration, and verbal cues required  HOME EXERCISE PROGRAM: 06/29/22- gentle A/ROM  GOALS: Goals reviewed with patient? Yes  SHORT TERM GOALS: Target date: 07/30/22  I with initial HEP  Goal status: INITIAL  2.  Pt will increase LUE  wrist flexion by 10* for increased function.  Goal status: INITIAL  3.  Pt will increase LUE wrist extension by 10* for increased functional use.  Goal status: INITIAL  4.   Pt will increase L thumb MP flexion by at least 15* for increased functional use.  Goal status: INITIAL 5. Pt will demonstrate understanding of pain reduction strategies including splinting prn  Baseline: initial    LONG TERM GOALS: Target date: 09/21/22  I with updated HEP  Goal status: INITIAL  2.  Pt will improve UEFS to 70% impairment or better. Baseline: 79% Goal status: INITIAL  3.  Pt will report using LUE as a non dominant assist for ADLs/IADLS at least 50% of the time with pain no greater than 3/10.  Goal status: INITIAL  4.  Pt will demonstrate LUE grip strength of at least 15 lbs for increased LUE functional use.  Goal status: INITIAL   CLINICAL IMPRESSION: Patient is a 78 y.o. female who was seen today for occupational therapy evaluation for arthritis of carpometacarpal (CMC)  joint of left thumb with recent hx of intra-articular distal radius fracture. Pt had a fall  03/23/22 and injured her RUE.  Pt can benefit from skilled occupational therapy to increase functional use of LUE and to maximize pt's safety and I with ADLS/IADLs.  PERFORMANCE DEFICITS: in functional skills including ADLs, IADLs, coordination, dexterity, sensation, edema, ROM, strength, pain, flexibility, Fine motor control, Gross motor control, balance, decreased knowledge of precautions, decreased knowledge of use of DME, and UE functional use, cognitive skills including memory, and psychosocial skills including coping strategies, environmental adaptation, habits, interpersonal interactions, and routines and behaviors.   IMPAIRMENTS: are limiting patient from ADLs, IADLs, rest and sleep, play, leisure, and social participation.   COMORBIDITIES: may have co-morbidities  that affects occupational performance. Patient will benefit from skilled OT to address above impairments and improve overall function.  MODIFICATION OR ASSISTANCE TO COMPLETE EVALUATION: No modification of tasks or assist necessary to complete an evaluation.  OT OCCUPATIONAL PROFILE AND HISTORY: Detailed assessment: Review of records and additional review of physical, cognitive, psychosocial history related to current functional performance.  CLINICAL DECISION MAKING: LOW - limited treatment options, no task modification necessary  REHAB POTENTIAL: Good  EVALUATION COMPLEXITY: Low      PLAN:  OT FREQUENCY: 2x/week  OT DURATION: 12 weeks, may d/c after 8 weeks dependent on progress.  PLANNED INTERVENTIONS: self care/ADL training, therapeutic exercise, therapeutic activity, neuromuscular re-education, manual therapy, scar mobilization, passive range of motion, splinting, electrical stimulation, ultrasound, paraffin, fluidotherapy, moist heat, cryotherapy, contrast bath, patient/family education, cognitive remediation/compensation,  energy conservation, coping strategies training, DME and/or AE instructions, and Re-evaluation  RECOMMENDED OTHER SERVICES: pt is already receiving PT  CONSULTED AND AGREED WITH PLAN OF CARE: Patient  PLAN FOR NEXT SESSION: progress HEP.   Dione Petron, OT 06/30/2022, 1:35 PM

## 2022-06-28 ENCOUNTER — Ambulatory Visit: Payer: Medicare Other | Admitting: Physical Therapy

## 2022-06-29 ENCOUNTER — Ambulatory Visit: Payer: Medicare Other | Attending: Family Medicine | Admitting: Occupational Therapy

## 2022-06-29 DIAGNOSIS — M25642 Stiffness of left hand, not elsewhere classified: Secondary | ICD-10-CM | POA: Diagnosis present

## 2022-06-29 DIAGNOSIS — R600 Localized edema: Secondary | ICD-10-CM

## 2022-06-29 DIAGNOSIS — M79642 Pain in left hand: Secondary | ICD-10-CM

## 2022-06-29 DIAGNOSIS — M25632 Stiffness of left wrist, not elsewhere classified: Secondary | ICD-10-CM

## 2022-06-29 DIAGNOSIS — M6281 Muscle weakness (generalized): Secondary | ICD-10-CM

## 2022-06-29 DIAGNOSIS — M1812 Unilateral primary osteoarthritis of first carpometacarpal joint, left hand: Secondary | ICD-10-CM | POA: Insufficient documentation

## 2022-06-29 DIAGNOSIS — M25531 Pain in right wrist: Secondary | ICD-10-CM

## 2022-06-29 NOTE — Patient Instructions (Signed)
AROM: Wrist Extension   .  With _left___ palm down, bend wrist up. Repeat __15__ times per set.  Do __3_ sessions per day.    AROM: Wrist Flexion    AROM: Forearm Pronation / Supination   Copyright  VHI. All rights reserved.    AROM: Wrist Extension   .  With _left__ palm down, bend wrist up. Repeat __15__ times per set.  Do __2-3_ sessions per day.  Flexor Tendon Gliding (Active Hook Fist)   With fingers and knuckles straight, bend middle and tip joints. Do not bend large knuckles. Repeat _10-15___ times. Do _3___ sessions per day.  MP Flexion (Active)   With back of hand on table, bend large knuckles as far as they will go, keeping small joints straight. Repeat _10-15___ times. Do __3_ sessions per day. Activity: Reach into a narrow container.*       Finger Flexion / Extension   With palm up, bend fingers of left hand toward palm, making a  fist. Straighten fingers, opening fist. Repeat sequence _10-15___ times per session. Do _3__ sessions per day. Hand Variation: Palm down   Copyright  VHI. All rights reserved.   Opposition (Active)   Touch tip of thumb to nail tip of each finger in turn, making an "O" shape. Repeat __10__ times. Do _4-6___ sessions per day.

## 2022-07-06 ENCOUNTER — Ambulatory Visit: Payer: Medicare Other | Admitting: Occupational Therapy

## 2022-07-14 ENCOUNTER — Encounter: Payer: Self-pay | Admitting: Occupational Therapy

## 2022-07-14 ENCOUNTER — Ambulatory Visit: Payer: Medicare Other | Attending: Family Medicine | Admitting: Occupational Therapy

## 2022-07-14 DIAGNOSIS — R600 Localized edema: Secondary | ICD-10-CM | POA: Diagnosis present

## 2022-07-14 DIAGNOSIS — M79642 Pain in left hand: Secondary | ICD-10-CM | POA: Insufficient documentation

## 2022-07-14 DIAGNOSIS — M6281 Muscle weakness (generalized): Secondary | ICD-10-CM | POA: Diagnosis present

## 2022-07-14 DIAGNOSIS — M25532 Pain in left wrist: Secondary | ICD-10-CM | POA: Diagnosis present

## 2022-07-14 DIAGNOSIS — M25642 Stiffness of left hand, not elsewhere classified: Secondary | ICD-10-CM | POA: Diagnosis present

## 2022-07-14 DIAGNOSIS — M25531 Pain in right wrist: Secondary | ICD-10-CM | POA: Diagnosis present

## 2022-07-14 DIAGNOSIS — M25632 Stiffness of left wrist, not elsewhere classified: Secondary | ICD-10-CM | POA: Diagnosis present

## 2022-07-14 NOTE — Patient Instructions (Addendum)
Opposition (Active)   Touch tip of thumb to nail tip of each finger in turn, making an "O" shape. Repeat __10__ times. Do _2-3__ sessions per day.   AROM: Thumb Abduction / Adduction    Actively pull right thumb away from palm as far as possible.  Then bring thumb back to touch fingers. Try not to bend fingers toward thumb. Repeat _10-15___ times per set.  Do __2-3__ sessions per day.    MP Flexion (Active)   Bend thumb to touch base of little finger, keeping tip joint straight. Repeat __10-15__ times. Do _2-3__ sessions per day        Brace thumb below tip joint. Bend joint as far as possible. Repeat __10__ times. Do _2-3___ sessions per day.    Thumb Circles    Move right thumb in a circle. Repeat __10__ times each direction per session. Do __7__ sessions per week. Hand Variation: Palm up   Copyright  VHI. All rights reserved.     Use your right hand to stretch your thenar eminence of Left hand gently,  hold for 5-10 secs, 5 reps 2-3 x day

## 2022-07-14 NOTE — Therapy (Signed)
OUTPATIENT OCCUPATIONAL THERAPY ORTHO Treatment  Patient Name: Monica Khan MRN: [id number] DOB:1945/05/07, 77 y.o., female Today's Date: 07/14/2022  PCP: Dr. Roney Mans REFERRING PROVIDER: Dr. Jordan Likes  END OF SESSION:  OT End of Session - 07/14/22 1241     Visit Number 2    Number of Visits 25    Date for OT Re-Evaluation 09/21/22    Authorization Type Medicare    Authorization - Visit Number 2    Authorization - Number of Visits 10    OT Start Time 0849    OT Stop Time 0930    OT Time Calculation (min) 41 min    Activity Tolerance Patient tolerated treatment well    Behavior During Therapy WFL for tasks assessed/performed              Past Medical History:  Diagnosis Date   Breast cancer (HCC)    In remission   Fluttering heart    Gastritis    Migraines    Peripheral neuropathy    Shingles    Recurrent   Past Surgical History:  Procedure Laterality Date   ABDOMINAL HYSTERECTOMY     APPENDECTOMY     CHOLECYSTECTOMY     TOTAL MASTECTOMY     Patient Active Problem List   Diagnosis Date Noted   Wrist sprain, right, initial encounter 04/12/2022   Chondrocalcinosis of right knee 04/12/2022   Arthritis of carpometacarpal (CMC) joint of left thumb 04/12/2022   Cervical strain 04/12/2022   Concussion with no loss of consciousness 04/12/2022   Primary osteoarthritis of left shoulder 04/12/2022   Protein-calorie malnutrition, severe (HCC) 09/20/2012   Hepatocellular injury 09/17/2012   Transaminitis 09/17/2012   Nausea and vomiting 09/17/2012   Shingles 09/17/2012   History of breast cancer 09/17/2012    ONSET DATE: referral 05/12/22  REFERRING DIAG: M18.12 (ICD-10-CM) - Arthritis of carpometacarpal (CMC) joint of left thumb   THERAPY DIAG:  Pain in right wrist  Muscle weakness (generalized)  Pain in left hand  Localized edema  Stiffness of left wrist, not elsewhere classified  Stiffness of left hand, not elsewhere classified  Pain in left  wrist  Rationale for Evaluation and Treatment: Rehabilitation  SUBJECTIVE:   SUBJECTIVE STATEMENT: Pt wants to be able to use L hand without pain Pt accompanied by: self  PERTINENT HISTORY: Fall 03/23/22, History of breast cancer, short term memory loss, peripheral neuropathy, stent in heart, Afib, long term anticoagulation therapy,  history of CVA, cervical spondylosis, migraines    DIAGNOSTIC FINDINGS:  03/23/22 R shoulder FINDINGS: Mild widening of the [facility] joint with relative elevation of the distal clavicle. This suggests ligamentous injury at the [facility] joint. No evidence of regional fracture. Humeral head is properly located. 03/23/22 L shoulder FINDINGS: There is no evidence of fracture or dislocation. There is no evidence of arthropathy or other focal bone abnormality. Soft tissues are unremarkable. 03/23/22 CT Cervical Spine IMPRESSION: 1. No acute intracranial process. 2. Atrophy with chronic microvascular ischemic changes. 3. No evidence of facial bone fracture. 4. Degenerative changes in the cervical spine without evidence of acute fracture. 03/23/22 DG Wrist R IMPRESSION: No acute or traumatic finding. Chronic degenerative changes at the first carpometacarpal joint and articulation of the multangular bones and scaphoid. 03/23/22 DG Wrist Left IMPRESSION: No acute or traumatic finding. Chronic degenerative changes at the first carpometacarpal joint. Chondrocalcinosis of the wrist joint.   04/19/22 Limited ultrasound: Left wrist pain/left shoulder pain:   Left wrist: Effusion noted within the carpal joints. There is  an intense area of hyperemia at the distal radius within the joint.  There is no changes of the distal third of the radius. Degenerative changes of the scaphoid with no hyperemia. Degenerative changes of the [facility] joint.   Left shoulder: Effusion noted within the biceps tendon sheath. Chronic changes of the subscapularis. AC joint with degenerative changes. Chronic  tendinopathy of the supraspinatus   Summary: Findings consistent with intra-articular distal radius fracture and effusion of the glenohumeral joint   Ultrasound and interpretation by Clare Gandy, MD PAIN:  Are you having pain? Yes:  PRECAUTIONS: Fall  WEIGHT BEARING RESTRICTIONS: limited by pain, no heavy use of L hand  PAIN:  Are you having pain? Yes: NPRS scale: 5-7/10 Pain location:  L thumb and wrist  Pain description: aching Aggravating factors: use, abduction, lifting Relieving factors: pain meds  FALLS: Has patient fallen in last 6 months? Yes. Number of falls 1  LIVING ENVIRONMENT: Lives with: lives alone Lives in: House/apartment PLOF: Independent  PATIENT GOALS: improve use of LUE  NEXT MD VISIT: TBD  OBJECTIVE:   HAND DOMINANCE: Right  ADLs: Overall ADLs: mod I with alll basic ADLS using primarily RUE Transfers/ambulation related to ADLs:mod I   FUNCTIONAL OUTCOME MEASURES: Upper Extremity Functional Scale (UEFS): 17/80, MCR rating 79% impairment (max function:21%)  UPPER EXTREMITY ROM:       Active ROM Right eval Left eval  Thumb MCP (0-60)  10  Thumb IP (0-80)  25  Thumb Radial abd/add (0-55)  40   Thumb Palmar abd/add (0-45)     Thumb Opposition to Small Finger  Opposes all with pain   Index MCP (0-90)   70  Index PIP (0-100)   70  Index DIP (0-70)      Long MCP (0-90)   70   Long PIP (0-100)   70   Long DIP (0-70)      Ring MCP (0-90)   55   Ring PIP (0-100)    75  Ring DIP (0-70)      Little MCP (0-90)    55  Little PIP (0-100)    75  Little DIP (0-70)      (Blank rows = not tested)   HAND FUNCTION: Grip strength: Right: 23 lbs; Left: 5 lbs  COORDINATION: NT  SENSATION: Light touch: Impaired  for LUE  EDEMA: mild in thumb and wrist  COGNITION: Overall cognitive status: Impaired Areas of impairment: Short term memory  OBSERVATIONS: Pt is point tender at her wrist and thumb CMC joint   TODAY'S TREATMENT:                                                                                                                               DATE: 07/13/21- Paraffin to left hand x 10 mins for pain relief, no adverse reactions. Pt brought in her pre-fab braces and therapist assess fiT of shorter brace.Therapist recommended that pt wears shorter thumb brace  splint for daily activities for pain management Gentle passive stretch to thumb at thenar eminence and CMC.Reviewed gentle A/ROM HEP issued at first visit. Therapist added additional exercises for thumb to HEP. Pt returned demonstration.  06/29/22- Paraffin applied to left wrist and hand x 8 mins for pain and stiffness. No adverse reactions. Pt was instructed in initial HEP. See pt instructions.    PATIENT EDUCATION: Education details:HEP review and additional exercises added for thumb Person educated: Patient Education method: Explanation, demonstration Education comprehension: verbalized understanding, returned demonstration, and verbal cues required  HOME EXERCISE PROGRAM: 06/29/22- gentle A/ROM  GOALS: Goals reviewed with patient? Yes  SHORT TERM GOALS: Target date: 07/30/22  I with initial HEP  Goal status: INITIAL  2.  Pt will increase LUE  wrist flexion by 10* for increased function.  Goal status: INITIAL  3.  Pt will increase LUE wrist extension by 10* for increased functional use.  Goal status: INITIAL  4.   Pt will increase L thumb MP flexion by at least 15* for increased functional use.  Goal status: INITIAL 5. Pt will demonstrate understanding of pain reduction strategies including splinting prn  Baseline: initial    LONG TERM GOALS: Target date: 09/21/22  I with updated HEP  Goal status: INITIAL  2.  Pt will improve UEFS to 70% impairment or better. Baseline: 79% Goal status: INITIAL  3.  Pt will report using LUE as a non dominant assist for ADLs/IADLS at least 50% of the time with pain no greater than 3/10.  Goal status:  INITIAL  4.  Pt will demonstrate LUE grip strength of at least 15 lbs for increased LUE functional use.  Goal status: INITIAL   CLINICAL IMPRESSION: Patient is progressing towards goals with improving pain and ROM. PERFORMANCE DEFICITS: in functional skills including ADLs, IADLs, coordination, dexterity, sensation, edema, ROM, strength, pain, flexibility, Fine motor control, Gross motor control, balance, decreased knowledge of precautions, decreased knowledge of use of DME, and UE functional use, cognitive skills including memory, and psychosocial skills including coping strategies, environmental adaptation, habits, interpersonal interactions, and routines and behaviors.   IMPAIRMENTS: are limiting patient from ADLs, IADLs, rest and sleep, play, leisure, and social participation.   COMORBIDITIES: may have co-morbidities  that affects occupational performance. Patient will benefit from skilled OT to address above impairments and improve overall function.  MODIFICATION OR ASSISTANCE TO COMPLETE EVALUATION: No modification of tasks or assist necessary to complete an evaluation.  OT OCCUPATIONAL PROFILE AND HISTORY: Detailed assessment: Review of records and additional review of physical, cognitive, psychosocial history related to current functional performance.  CLINICAL DECISION MAKING: LOW - limited treatment options, no task modification necessary  REHAB POTENTIAL: Good  EVALUATION COMPLEXITY: Low      PLAN:  OT FREQUENCY: 2x/week  OT DURATION: 12 weeks, may d/c after 8 weeks dependent on progress.  PLANNED INTERVENTIONS: self care/ADL training, therapeutic exercise, therapeutic activity, neuromuscular re-education, manual therapy, scar mobilization, passive range of motion, splinting, electrical stimulation, ultrasound, paraffin, fluidotherapy, moist heat, cryotherapy, contrast bath, patient/family education, cognitive remediation/compensation, energy conservation, coping  strategies training, DME and/or AE instructions, and Re-evaluation  RECOMMENDED OTHER SERVICES: pt is already receiving PT  CONSULTED AND AGREED WITH PLAN OF CARE: Patient  PLAN FOR NEXT SESSION: progress HEP, continue paraffin   Moxie Kalil, OT 07/14/2022, 12:45 PM

## 2022-07-21 ENCOUNTER — Encounter: Payer: Self-pay | Admitting: Occupational Therapy

## 2022-07-21 ENCOUNTER — Ambulatory Visit: Payer: Medicare Other | Admitting: Occupational Therapy

## 2022-07-21 DIAGNOSIS — M79642 Pain in left hand: Secondary | ICD-10-CM

## 2022-07-21 DIAGNOSIS — M25642 Stiffness of left hand, not elsewhere classified: Secondary | ICD-10-CM

## 2022-07-21 DIAGNOSIS — M25632 Stiffness of left wrist, not elsewhere classified: Secondary | ICD-10-CM

## 2022-07-21 DIAGNOSIS — M25531 Pain in right wrist: Secondary | ICD-10-CM | POA: Diagnosis not present

## 2022-07-21 DIAGNOSIS — R600 Localized edema: Secondary | ICD-10-CM

## 2022-07-21 DIAGNOSIS — M6281 Muscle weakness (generalized): Secondary | ICD-10-CM

## 2022-07-21 DIAGNOSIS — M25532 Pain in left wrist: Secondary | ICD-10-CM

## 2022-07-21 NOTE — Therapy (Signed)
OUTPATIENT OCCUPATIONAL THERAPY ORTHO Treatment  Patient Name: Bralyn Mccahill MRN: [id number] DOB:23-Feb-1945, 77 y.o., female Today's Date: 07/21/2022  PCP: Dr. Roney Mans REFERRING PROVIDER: Dr. Jordan Likes  END OF SESSION:  OT End of Session - 07/21/22 0852     Visit Number 3    Number of Visits 25    Date for OT Re-Evaluation 09/21/22    Authorization Type Medicare    Authorization - Visit Number 3    Authorization - Number of Visits 10    OT Start Time 0847    OT Stop Time 0928    OT Time Calculation (min) 41 min    Activity Tolerance Patient tolerated treatment well    Behavior During Therapy WFL for tasks assessed/performed               Past Medical History:  Diagnosis Date   Breast cancer (HCC)    In remission   Fluttering heart    Gastritis    Migraines    Peripheral neuropathy    Shingles    Recurrent   Past Surgical History:  Procedure Laterality Date   ABDOMINAL HYSTERECTOMY     APPENDECTOMY     CHOLECYSTECTOMY     TOTAL MASTECTOMY     Patient Active Problem List   Diagnosis Date Noted   Wrist sprain, right, initial encounter 04/12/2022   Chondrocalcinosis of right knee 04/12/2022   Arthritis of carpometacarpal (CMC) joint of left thumb 04/12/2022   Cervical strain 04/12/2022   Concussion with no loss of consciousness 04/12/2022   Primary osteoarthritis of left shoulder 04/12/2022   Protein-calorie malnutrition, severe (HCC) 09/20/2012   Hepatocellular injury 09/17/2012   Transaminitis 09/17/2012   Nausea and vomiting 09/17/2012   Shingles 09/17/2012   History of breast cancer 09/17/2012    ONSET DATE: referral 05/12/22  REFERRING DIAG: M18.12 (ICD-10-CM) - Arthritis of carpometacarpal (CMC) joint of left thumb   THERAPY DIAG:  Pain in right wrist  Muscle weakness (generalized)  Pain in left hand  Localized edema  Stiffness of left wrist, not elsewhere classified  Stiffness of left hand, not elsewhere classified  Pain in left  wrist  Rationale for Evaluation and Treatment: Rehabilitation  SUBJECTIVE:   SUBJECTIVE STATEMENT: Pt reports she over did it yesterday  PERTINENT HISTORY: Fall 03/23/22, History of breast cancer, short term memory loss, peripheral neuropathy, stent in heart, Afib, long term anticoagulation therapy,  history of CVA, cervical spondylosis, migraines    DIAGNOSTIC FINDINGS:  03/23/22 R shoulder FINDINGS: Mild widening of the [facility] joint with relative elevation of the distal clavicle. This suggests ligamentous injury at the [facility] joint. No evidence of regional fracture. Humeral head is properly located. 03/23/22 L shoulder FINDINGS: There is no evidence of fracture or dislocation. There is no evidence of arthropathy or other focal bone abnormality. Soft tissues are unremarkable. 03/23/22 CT Cervical Spine IMPRESSION: 1. No acute intracranial process. 2. Atrophy with chronic microvascular ischemic changes. 3. No evidence of facial bone fracture. 4. Degenerative changes in the cervical spine without evidence of acute fracture. 03/23/22 DG Wrist R IMPRESSION: No acute or traumatic finding. Chronic degenerative changes at the first carpometacarpal joint and articulation of the multangular bones and scaphoid. 03/23/22 DG Wrist Left IMPRESSION: No acute or traumatic finding. Chronic degenerative changes at the first carpometacarpal joint. Chondrocalcinosis of the wrist joint.   04/19/22 Limited ultrasound: Left wrist pain/left shoulder pain:   Left wrist: Effusion noted within the carpal joints. There is an intense area of hyperemia at the  distal radius within the joint.  There is no changes of the distal third of the radius. Degenerative changes of the scaphoid with no hyperemia. Degenerative changes of the [facility] joint.   Left shoulder: Effusion noted within the biceps tendon sheath. Chronic changes of the subscapularis. AC joint with degenerative changes. Chronic tendinopathy of the  supraspinatus   Summary: Findings consistent with intra-articular distal radius fracture and effusion of the glenohumeral joint   Ultrasound and interpretation by Clare Gandy, MD PAIN:  Are you having pain? Yes:  PRECAUTIONS: Fall  WEIGHT BEARING RESTRICTIONS: limited by pain, no heavy use of L hand  PAIN:  Are you having pain? Yes: NPRS scale: 7/10 Pain location:  L thumb and wrist  Pain description: aching Aggravating factors: use, abduction, lifting Relieving factors: pain meds  FALLS: Has patient fallen in last 6 months? Yes. Number of falls 1  LIVING ENVIRONMENT: Lives with: lives alone Lives in: House/apartment PLOF: Independent  PATIENT GOALS: improve use of LUE  NEXT MD VISIT: TBD  OBJECTIVE:   HAND DOMINANCE: Right  ADLs: Overall ADLs: mod I with alll basic ADLS using primarily RUE Transfers/ambulation related to ADLs:mod I   FUNCTIONAL OUTCOME MEASURES: Upper Extremity Functional Scale (UEFS): 17/80, MCR rating 79% impairment (max function:21%)  UPPER EXTREMITY ROM:       Active ROM Right eval Left eval  Thumb MCP (0-60)  10  Thumb IP (0-80)  25  Thumb Radial abd/add (0-55)  40   Thumb Palmar abd/add (0-45)     Thumb Opposition to Small Finger  Opposes all with pain   Index MCP (0-90)   70  Index PIP (0-100)   70  Index DIP (0-70)      Long MCP (0-90)   70   Long PIP (0-100)   70   Long DIP (0-70)      Ring MCP (0-90)   55   Ring PIP (0-100)    75  Ring DIP (0-70)      Little MCP (0-90)    55  Little PIP (0-100)    75  Little DIP (0-70)      (Blank rows = not tested)   HAND FUNCTION: Grip strength: Right: 23 lbs; Left: 5 lbs  COORDINATION: NT  SENSATION: Light touch: Impaired  for LUE  EDEMA: mild in thumb and wrist  COGNITION: Overall cognitive status: Impaired Areas of impairment: Short term memory  OBSERVATIONS: Pt is point tender at her wrist and thumb CMC joint   TODAY'S TREATMENT:                                                                                                                               DATE: 612/24-Paraffin to left hand x 10 mins for pain relief, no adverse reactions for pain relief. Passive stretch to thenar eminence in abduction A/ROM wrist flexion/ extension 15 reps, thumb opposition to each digit 5 reps each, hook fist x 10  reps, finger flexion/ extension x 10 reps  Pt required rest breaks in between exercises. Pt attempted thumb circles, however pt had too much pain. Discussed importance of balancing rest and activity and not doing too much in 1 day. Therapist encouraged pt to consider getting some assistance for heavier cleaning. Ice pack applied to left thumb/ hand and elbow x 5 mins for pain relief. Pain wa simproved at end of session. Pt was encouraged to rest today.    07/13/21- Paraffin to left hand x 10 mins for pain relief, no adverse reactions. Pt brought in her pre-fab braces and therapist assess fit of shorter brace.Therapist recommended that pt wears shorter thumb brace splint for daily activities for pain management Gentle passive stretch to thumb at thenar eminence and CMC.Reviewed gentle A/ROM HEP issued at first visit. Therapist added additional exercises for thumb to HEP. Pt returned demonstration.  06/29/22- Paraffin applied to left wrist and hand x 8 mins for pain and stiffness. No adverse reactions. Pt was instructed in initial HEP. See pt instructions.    PATIENT EDUCATION: Education details:HEP review and additional exercises added for thumb Person educated: Patient Education method: Explanation, demonstration Education comprehension: verbalized understanding, returned demonstration, and verbal cues required  HOME EXERCISE PROGRAM: 06/29/22- gentle A/ROM  GOALS: Goals reviewed with patient? Yes  SHORT TERM GOALS: Target date: 07/30/22  I with initial HEP  Goal status: met, 07/21/22  2.  Pt will increase LUE  wrist flexion by 10* for increased  function.  Goal status: ongoing 07/21/22  3.  Pt will increase LUE wrist extension by 10* for increased functional use.  Goal status: ongoing 07/21/22   4.   Pt will increase L thumb MP flexion by at least 15* for increased functional use.  Goal status: ongoing 07/21/22 5. Pt will demonstrate understanding of pain reduction strategies including splinting prn  Baseline: initial    LONG TERM GOALS: Target date: 09/21/22  I with updated HEP  Goal status: INITIAL  2.  Pt will improve UEFS to 70% impairment or better. Baseline: 79% Goal status: INITIAL  3.  Pt will report using LUE as a non dominant assist for ADLs/IADLS at least 50% of the time with pain no greater than 3/10.  Goal status: INITIAL  4.  Pt will demonstrate LUE grip strength of at least 15 lbs for increased LUE functional use.  Goal status: INITIAL   CLINICAL IMPRESSION: Patient is progressing slowly towards goals.She did a lot cleaning yesterday and she has more pain today. Therapist encouraged pt to break down cleaning tasks over several days.  PERFORMANCE DEFICITS: in functional skills including ADLs, IADLs, coordination, dexterity, sensation, edema, ROM, strength, pain, flexibility, Fine motor control, Gross motor control, balance, decreased knowledge of precautions, decreased knowledge of use of DME, and UE functional use, cognitive skills including memory, and psychosocial skills including coping strategies, environmental adaptation, habits, interpersonal interactions, and routines and behaviors.   IMPAIRMENTS: are limiting patient from ADLs, IADLs, rest and sleep, play, leisure, and social participation.   COMORBIDITIES: may have co-morbidities  that affects occupational performance. Patient will benefit from skilled OT to address above impairments and improve overall function.  MODIFICATION OR ASSISTANCE TO COMPLETE EVALUATION: No modification of tasks or assist necessary to complete an evaluation.  OT  OCCUPATIONAL PROFILE AND HISTORY: Detailed assessment: Review of records and additional review of physical, cognitive, psychosocial history related to current functional performance.  CLINICAL DECISION MAKING: LOW - limited treatment options, no task modification necessary  REHAB  POTENTIAL: Good  EVALUATION COMPLEXITY: Low      PLAN:  OT FREQUENCY: 2x/week  OT DURATION: 12 weeks, may d/c after 8 weeks dependent on progress.  PLANNED INTERVENTIONS: self care/ADL training, therapeutic exercise, therapeutic activity, neuromuscular re-education, manual therapy, scar mobilization, passive range of motion, splinting, electrical stimulation, ultrasound, paraffin, fluidotherapy, moist heat, cryotherapy, contrast bath, patient/family education, cognitive remediation/compensation, energy conservation, coping strategies training, DME and/or AE instructions, and Re-evaluation  RECOMMENDED OTHER SERVICES: pt is already receiving PT  CONSULTED AND AGREED WITH PLAN OF CARE: Patient  PLAN FOR NEXT SESSION: monitor pain,progress HEP, continue paraffin   Harris Kistler, OT 07/21/2022, 8:56 AM

## 2022-07-28 ENCOUNTER — Ambulatory Visit: Payer: Medicare Other | Admitting: Occupational Therapy

## 2022-08-04 ENCOUNTER — Ambulatory Visit: Payer: Medicare Other | Admitting: Occupational Therapy

## 2022-10-13 ENCOUNTER — Emergency Department (HOSPITAL_BASED_OUTPATIENT_CLINIC_OR_DEPARTMENT_OTHER): Payer: Medicare Other

## 2022-10-13 ENCOUNTER — Other Ambulatory Visit: Payer: Self-pay

## 2022-10-13 ENCOUNTER — Encounter (HOSPITAL_BASED_OUTPATIENT_CLINIC_OR_DEPARTMENT_OTHER): Payer: Self-pay | Admitting: Pediatrics

## 2022-10-13 ENCOUNTER — Emergency Department (HOSPITAL_BASED_OUTPATIENT_CLINIC_OR_DEPARTMENT_OTHER)
Admission: EM | Admit: 2022-10-13 | Discharge: 2022-10-13 | Disposition: A | Discharge status: Home / Self Care | Payer: Medicare Other | Attending: Emergency Medicine | Admitting: Emergency Medicine

## 2022-10-13 DIAGNOSIS — Z7901 Long term (current) use of anticoagulants: Secondary | ICD-10-CM | POA: Diagnosis not present

## 2022-10-13 DIAGNOSIS — S61211A Laceration without foreign body of left index finger without damage to nail, initial encounter: Secondary | ICD-10-CM | POA: Diagnosis not present

## 2022-10-13 DIAGNOSIS — Z7902 Long term (current) use of antithrombotics/antiplatelets: Secondary | ICD-10-CM | POA: Insufficient documentation

## 2022-10-13 DIAGNOSIS — W268XXA Contact with other sharp object(s), not elsewhere classified, initial encounter: Secondary | ICD-10-CM | POA: Diagnosis not present

## 2022-10-13 DIAGNOSIS — S6992XA Unspecified injury of left wrist, hand and finger(s), initial encounter: Secondary | ICD-10-CM | POA: Diagnosis present

## 2022-10-13 DIAGNOSIS — R519 Headache, unspecified: Secondary | ICD-10-CM | POA: Diagnosis not present

## 2022-10-13 MED ORDER — LIDOCAINE-EPINEPHRINE (PF) 2 %-1:200000 IJ SOLN
10.0000 mL | Freq: Once | INTRAMUSCULAR | Status: AC
  Administered 2022-10-13: 10 mL
  Filled 2022-10-13: qty 20

## 2022-10-13 MED ORDER — ONDANSETRON 4 MG PO TBDP
4.0000 mg | ORAL_TABLET | Freq: Once | ORAL | Status: AC
  Administered 2022-10-13: 4 mg via ORAL
  Filled 2022-10-13: qty 1

## 2022-10-13 NOTE — Discharge Instructions (Signed)
Please read and follow all provided instructions.  Your diagnoses today include:  1. Laceration of left index finger, foreign body presence unspecified, nail damage status unspecified, initial encounter   2. Acute nonintractable headache, unspecified headache type     Tests performed today include: X-ray of the affected area that did not show any foreign bodies or broken bones CT scan of your head was negative Vital signs. See below for your results today.   Medications prescribed:  None  Take any prescribed medications only as directed.   Home care instructions:  Follow any educational materials and wound care instructions contained in this packet.   Leave the dressing in place until afternoon tomorrow.  Gently remove and start routine wound care with mild soap and water and then bandage.  Keep affected area above the level of your heart when possible to minimize swelling. Wash area gently twice a day with warm soapy water. Do not apply alcohol or hydrogen peroxide. Cover the area if it draining or weeping.   Follow-up instructions: As needed  Return instructions:  Return to the Emergency Department if you have: Fever Worsening pain Worsening swelling of the wound Pus draining from the wound Redness of the skin that moves away from the wound, especially if it streaks away from the affected area  Any other emergent concerns  Your vital signs today were: BP 136/72 (BP Location: Right Arm)   Pulse 85   Temp 99.1 F (37.3 C)   Resp 20   Ht 4' 11.75" (1.518 m)   Wt 47.6 kg   SpO2 98%   BMI 20.68 kg/m  If your blood pressure (BP) was elevated above 135/85 this visit, please have this repeated by your doctor within one month. --------------

## 2022-10-13 NOTE — ED Provider Notes (Signed)
[facility] Provider Note   CSN: [id number] Arrival date & time: 10/13/22  1255     History  Chief Complaint  Patient presents with   Extremity Laceration    Monica Khan is a 77 y.o. female.  Patient presents to the emergency department for evaluation of left index finger laceration.  Patient is anticoagulated on Coumadin and also takes Plavix.  Patient states that just prior to arrival today she cut her finger on a can.  She had brisk bleeding of her left index finger between the DIP and PIP joints on the volar aspect.  She applied pressure.  Patient states that shortly afterwards she developed an acute generalized headache.  This was associated with a couple episodes of nausea and vomiting which persisted after arrival.  No confusion.  No weakness, numbness, or tingling in the arms or legs.  Patient states that she was recently diagnosed with shingles and has been dealing with that over the past week, with occasional fevers.  She completed a course of Valtrex.  She reports having nausea at home, acutely worsened with her injury today.       Home Medications Prior to Admission medications   Medication Sig Start Date End Date Taking? Authorizing Provider  azelastine (ASTELIN) 137 MCG/SPRAY nasal spray Place 1 spray into the nose 2 (two) times daily as needed for rhinitis.    [provider]  doxycycline (VIBRAMYCIN) 100 MG capsule Take 1 capsule (100 mg total) by mouth 2 (two) times daily. 12/22/18   Jacalyn Lefevre, MD  feeding supplement (RESOURCE BREEZE) LIQD Take 1 Container by mouth 3 (three) times daily between meals. 09/22/12   Rai, Delene Ruffini, MD  HYDROcodone-acetaminophen (NORCO/VICODIN) 5-325 MG tablet Take 1 tablet by mouth every 4 (four) hours as needed. 12/22/18   Jacalyn Lefevre, MD  meperidine (DEMEROL) 50 MG tablet Take 50-100 mg by mouth every 4 (four) hours as needed for pain.    [provider]   omeprazole (PRILOSEC) 40 MG capsule Take 40 mg by mouth daily.    [provider]  ondansetron (ZOFRAN ODT) 4 MG disintegrating tablet Take 1 tablet (4 mg total) by mouth every 8 (eight) hours as needed. 12/22/18   Jacalyn Lefevre, MD  oxyCODONE-acetaminophen (PERCOCET/ROXICET) 5-325 MG tablet Take 1 tablet by mouth every 6 (six) hours as needed for severe pain. 08/21/18   Horton, Mayer Masker, MD  promethazine (PHENERGAN) 12.5 MG tablet Take 1 tablet (12.5 mg total) by mouth every 6 (six) hours as needed for nausea. 09/22/12   Rai, Ripudeep K, MD  temazepam (RESTORIL) 30 MG capsule Take 30 mg by mouth at bedtime as needed for sleep.    [provider]  traZODone (DESYREL) 50 MG tablet Take 50 mg by mouth at bedtime as needed for sleep or depression.    [provider]  valACYclovir (VALTREX) 1000 MG tablet Take 1 tablet (1,000 mg total) by mouth 3 (three) times daily. Till 09/24/12 then take daily 09/22/12   Rai, Delene Ruffini, MD  warfarin (COUMADIN) 4 MG tablet Take 4-6 mg by mouth daily with breakfast. Takes 4mg  for 2 days and takes 6mg  on day 3; then repeats the cycle.    [provider]      Allergies    Cephalosporins, Penicillins, Codeine, Compazine [prochlorperazine edisylate], and Morphine and codeine    Review of Systems   Review of Systems  Physical Exam Updated Vital Signs BP 136/72 (BP Location: Right Arm)  Pulse 85   Temp 99.1 F (37.3 C)   Resp 20   Ht 4' 11.75" (1.518 m)   Wt 47.6 kg   SpO2 98%   BMI 20.68 kg/m  Physical Exam Vitals and nursing note reviewed.  Constitutional:      General: She is not in acute distress.    Appearance: She is well-developed.  HENT:     Head: Normocephalic and atraumatic.     Right Ear: External ear normal.     Left Ear: External ear normal.     Nose: Nose normal.  Eyes:     Conjunctiva/sclera: Conjunctivae normal.  Cardiovascular:     Rate and Rhythm: Normal rate and regular rhythm.     Heart  sounds: No murmur heard. Pulmonary:     Effort: No respiratory distress.     Breath sounds: No wheezing, rhonchi or rales.  Abdominal:     Palpations: Abdomen is soft.     Tenderness: There is no abdominal tenderness. There is no guarding or rebound.  Musculoskeletal:     Cervical back: Normal range of motion and neck supple.     Right lower leg: No edema.     Left lower leg: No edema.  Skin:    General: Skin is warm and dry.     Findings: No rash.     Comments: There is a 2 cm superficial flap type laceration to the volar aspect of the left index finger between the DIP and PIP.  There is minor bleeding from this wound.  Patient is flexing and extending the finger without difficulty.  Distal sensation is intact.  Capillary refills less than 2 seconds in this digit.  Neurological:     General: No focal deficit present.     Mental Status: She is alert. Mental status is at baseline.     Motor: No weakness.  Psychiatric:        Mood and Affect: Mood normal.     ED Results / Procedures / Treatments   Labs (all labs ordered are listed, but only abnormal results are displayed) Labs Reviewed - No data to display  EKG None  Radiology DG Hand Complete Left  Result Date: 10/13/2022 CLINICAL DATA:  Left index finger laceration today. EXAM: LEFT HAND - COMPLETE 3+ VIEW COMPARISON:  Left wrist radiographs 05/03/2022 and left hand radiographs 03/23/2022 FINDINGS: Mildly decreased bone mineralization. There is bandage material overlying the index finger. No radiopaque foreign body is seen. Severe thumb carpometacarpal joint space narrowing, subchondral sclerosis and cystic change, and peripheral osteophytosis. Tiny well corticated chronic ossicle lateral to the base of the thumb metacarpal. These findings are unchanged. Moderate to severe triscaphe joint space narrowing, subchondral cystic change, and lateral osteophytosis, unchanged. Severe index finger DIP joint, moderate to severe thumb  interphalangeal joint, moderate third DIP, and mild fourth and fifth DIP joint space narrowing and peripheral osteophytosis. Moderate fifth PIP joint space narrowing. Moderate second and third metacarpophalangeal joint space narrowing. No acute fracture is seen.  No dislocation. IMPRESSION: 1. No acute fracture. 2. No radiopaque foreign body. 3. 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 4. Severe index finger DIP joint osteoarthritis, unchanged. Electronically Signed   By: Neita Garnet M.D.   On: 10/13/2022 13:36    Procedures Procedures    Medications Ordered in ED Medications  ondansetron (ZOFRAN-ODT) disintegrating tablet 4 mg (has no administration in time range)  lidocaine-EPINEPHrine (XYLOCAINE W/EPI) 2 %-1:200000 (PF) injection 10 mL (has no administration  in time range)    ED Course/ Medical Decision Making/ A&P    Patient seen and examined. History obtained directly from patient.   Labs/EKG: None ordered  Imaging: Personally reviewed and interpreted x-ray of the hand, agree negative for fracture.  Medications/Fluids: Ordered: Lidocaine 2% with epi, ODT Zofran.   Most recent vital signs reviewed and are as follows: BP 136/72 (BP Location: Right Arm)   Pulse 85   Temp 99.1 F (37.3 C)   Resp 20   Ht 4' 11.75" (1.518 m)   Wt 47.6 kg   SpO2 98%   BMI 20.68 kg/m   Initial impression: Minor finger laceration, superficial, nongaping.  Will anesthetize, place wound seal with pressure dressing and then splint.  Also it is concerning that the patient had an acute headache after the injury with associated vomiting.  She is anticoagulated.  She has a normal neurological exam, however headache is atypical.  It could be just related to the stress of the trauma, but would not be a typical response.  Discussed CT imaging of the head with patient and she agrees to proceed.    Reassessment performed. Patient appears stable.  Nausea  improved with ODT Zofran.  No further vomiting.  I injected about 0.5 cc of 2% lidocaine with epinephrine superficially into the wound to provide anesthesia and some hemostatic effect.  I then applied wound seal, covered with a strip of Xeroform, overlaid a 2 x 2 gauze and wrapped slightly with Coban, trying not to over tighten.  Reviewed pertinent lab work and imaging with patient at bedside. Questions answered.   Most current vital signs reviewed and are as follows: BP 136/72 (BP Location: Right Arm)   Pulse 85   Temp 99.1 F (37.3 C)   Resp 20   Ht 4' 11.75" (1.518 m)   Wt 47.6 kg   SpO2 98%   BMI 20.68 kg/m   Plan: Awaiting CT head read, assess after splinting.    4:23 PM Reassessment performed. Patient appears stable.  I checked patient's capillary refill prior to placement of finger splint.  Capillary refill less than 2 seconds, with brisk response.  Imaging personally visualized and interpreted including: CT head without contrast, agree negative.  Reviewed pertinent lab work and imaging with patient at bedside. Questions answered.   Most current vital signs reviewed and are as follows: BP 136/72 (BP Location: Right Arm)   Pulse 85   Temp 99.1 F (37.3 C)   Resp 20   Ht 4' 11.75" (1.518 m)   Wt 47.6 kg   SpO2 98%   BMI 20.68 kg/m   Plan: Discharge to home.   Prescriptions written for: None  Other home care instructions discussed: Patient encouraged to leave the splint and bandage in place until afternoon tomorrow to allow formation of clot.  At that time she can begin basic wound care at home with mild soap and water.  Encouraged to keep bandaged for the next several days.  She is instructed to continue her home warfarin and Plavix as prescribed.  She states that she had her Coumadin level checked yesterday and this was in the low twos.  ED return instructions discussed: Recurrent bleeding, uncontrolled pain  Follow-up instructions discussed: Patient encouraged  to follow-up with their PCP as needed.  Medical Decision Making Amount and/or Complexity of Data Reviewed Radiology: ordered.  Risk Prescription drug management.   Patient with minor finger laceration, very superficial, flap like laceration.  X-ray negative.  Bleeding controlled with bandaging as above.  Patient also had severe headache and vomiting associated with her injury.  No head injury.  Head CT was negative.  In regards to the patient's headache, critical differentials were considered including subarachnoid hemorrhage, intracerebral hemorrhage, epidural/subdural hematoma, pituitary apoplexy, vertebral/carotid artery dissection, giant cell arteritis, central venous thrombosis, reversible cerebral vasoconstriction, acute angle closure glaucoma, idiopathic intracranial hypertension, bacterial meningitis, viral encephalitis, carbon monoxide poisoning, posterior reversible encephalopathy syndrome, pre-eclampsia.   Reg flag symptoms related to these causes were considered including systemic symptoms (fever, weight loss), neurologic symptoms (confusion, mental status change, vision change, associated seizure), acute or sudden "thunderclap" onset, patient age 48 or older with new or progressive headache, patient of any age with first headache or change in headache pattern, pregnant or postpartum status, history of HIV or other immunocompromise, history of cancer, headache occurring with exertion, associated neck or shoulder pain, associated traumatic injury, concurrent use of anticoagulation, family history of spontaneous SAH, and concurrent drug use.    Other benign, more common causes of headache were considered including migraine, tension-type headache, cluster headache, referred pain from other cause such as sinus infection, dental pain, trigeminal neuralgia.   On exam, patient has a reassuring neuro exam including baseline mental status, no significant  neck pain or meningeal signs, no signs of severe infection or fever.   The patient's vital signs, pertinent lab work and imaging were reviewed and interpreted as discussed in the ED course. Hospitalization was considered for further testing, treatments, or serial exams/observation. However as patient is well-appearing, has a stable exam over the course of their evaluation, and reassuring studies today, I do not feel that they warrant admission at this time. This plan was discussed with the patient who verbalizes agreement and comfort with this plan and seems reliable and able to return to the Emergency Department with worsening or changing symptoms.           Final Clinical Impression(s) / ED Diagnoses Final diagnoses:  Laceration of left index finger, foreign body presence unspecified, nail damage status unspecified, initial encounter  Acute nonintractable headache, unspecified headache type    Rx / DC Orders ED Discharge Orders     None         Renne Crigler, PA-C 10/13/22 1627    Alvira Monday, MD 10/15/22 [phone]

## 2022-10-13 NOTE — ED Triage Notes (Signed)
States lacerated pointer finger on left hand approx 30 min ago with a can of beans. States she is on mx blood thinners, plavix and warfarin.

## 2023-04-13 ENCOUNTER — Emergency Department (HOSPITAL_BASED_OUTPATIENT_CLINIC_OR_DEPARTMENT_OTHER)

## 2023-04-13 ENCOUNTER — Emergency Department (HOSPITAL_BASED_OUTPATIENT_CLINIC_OR_DEPARTMENT_OTHER)
Admission: EM | Admit: 2023-04-13 | Discharge: 2023-04-13 | Disposition: A | Discharge status: Home / Self Care | Attending: Emergency Medicine | Admitting: Emergency Medicine

## 2023-04-13 ENCOUNTER — Other Ambulatory Visit: Payer: Self-pay

## 2023-04-13 ENCOUNTER — Encounter (HOSPITAL_BASED_OUTPATIENT_CLINIC_OR_DEPARTMENT_OTHER): Payer: Self-pay | Admitting: Emergency Medicine

## 2023-04-13 DIAGNOSIS — R1013 Epigastric pain: Secondary | ICD-10-CM | POA: Diagnosis present

## 2023-04-13 DIAGNOSIS — Z79899 Other long term (current) drug therapy: Secondary | ICD-10-CM | POA: Insufficient documentation

## 2023-04-13 LAB — CBC
HCT: 38.1 % (ref 36.0–46.0)
Hemoglobin: 12.8 g/dL (ref 12.0–15.0)
MCH: 30.2 pg (ref 26.0–34.0)
MCHC: 33.6 g/dL (ref 30.0–36.0)
MCV: 89.9 fL (ref 80.0–100.0)
Platelets: 292 10*3/uL (ref 150–400)
RBC: 4.24 MIL/uL (ref 3.87–5.11)
RDW: 12.3 % (ref 11.5–15.5)
WBC: 6.3 10*3/uL (ref 4.0–10.5)
nRBC: 0 % (ref 0.0–0.2)

## 2023-04-13 LAB — URINALYSIS, ROUTINE W REFLEX MICROSCOPIC
Bilirubin Urine: NEGATIVE
Glucose, UA: NEGATIVE mg/dL
Ketones, ur: NEGATIVE mg/dL
Leukocytes,Ua: NEGATIVE
Nitrite: NEGATIVE
Protein, ur: 30 mg/dL — AB
Specific Gravity, Urine: >=1.03 (ref 1.005–1.030)
pH: 6 (ref 5.0–8.0)

## 2023-04-13 LAB — TROPONIN I (HIGH SENSITIVITY): Troponin I (High Sensitivity): 4 ng/L (ref <18)

## 2023-04-13 LAB — URINALYSIS, MICROSCOPIC (REFLEX): RBC / HPF: >50 RBC/hpf (ref 0–5)

## 2023-04-13 LAB — COMPREHENSIVE METABOLIC PANEL
ALT: 16 U/L (ref 0–44)
AST: 21 U/L (ref 15–41)
Albumin: 4.1 g/dL (ref 3.5–5.0)
Alkaline Phosphatase: 38 U/L (ref 38–126)
Anion gap: 8 (ref 5–15)
BUN: 16 mg/dL (ref 8–23)
CO2: 24 mmol/L (ref 22–32)
Calcium: 8.9 mg/dL (ref 8.9–10.3)
Chloride: 107 mmol/L (ref 98–111)
Creatinine, Ser: 0.92 mg/dL (ref 0.44–1.00)
GFR, Estimated: >60 mL/min (ref >60)
Glucose, Bld: 106 mg/dL — ABNORMAL HIGH (ref 70–99)
Potassium: 3.8 mmol/L (ref 3.5–5.1)
Sodium: 139 mmol/L (ref 135–145)
Total Bilirubin: 0.5 mg/dL (ref 0.0–1.2)
Total Protein: 6.8 g/dL (ref 6.5–8.1)

## 2023-04-13 LAB — LIPASE, BLOOD: Lipase: 27 U/L (ref 11–51)

## 2023-04-13 MED ORDER — ONDANSETRON 4 MG PO TBDP: 4.0000 mg | ORAL_TABLET | Freq: Three times a day (TID) | ORAL | 0 refills | Status: AC | PRN

## 2023-04-13 MED ORDER — ONDANSETRON HCL 4 MG/2ML IJ SOLN
4.0000 mg | Freq: Once | INTRAMUSCULAR | Status: AC
  Administered 2023-04-13: 4 mg via INTRAVENOUS
  Filled 2023-04-13: qty 2

## 2023-04-13 MED ORDER — PANTOPRAZOLE SODIUM 20 MG PO TBEC: 20.0000 mg | DELAYED_RELEASE_TABLET | Freq: Every day | ORAL | 0 refills | Status: AC

## 2023-04-13 MED ORDER — ONDANSETRON 4 MG PO TBDP
4.0000 mg | ORAL_TABLET | Freq: Once | ORAL | Status: AC
  Administered 2023-04-13: 4 mg via ORAL
  Filled 2023-04-13: qty 1

## 2023-04-13 MED ORDER — IOHEXOL 350 MG/ML SOLN
100.0000 mL | Freq: Once | INTRAVENOUS | Status: AC | PRN
  Administered 2023-04-13: 100 mL via INTRAVENOUS

## 2023-04-13 MED ORDER — HYDROMORPHONE HCL 1 MG/ML IJ SOLN
1.0000 mg | Freq: Once | INTRAMUSCULAR | Status: AC
  Administered 2023-04-13: 1 mg via INTRAVENOUS
  Filled 2023-04-13: qty 1

## 2023-04-13 MED ORDER — FENTANYL CITRATE PF 50 MCG/ML IJ SOSY
25.0000 ug | PREFILLED_SYRINGE | Freq: Once | INTRAMUSCULAR | Status: AC
  Administered 2023-04-13: 25 ug via INTRAVENOUS
  Filled 2023-04-13: qty 1

## 2023-04-13 MED ORDER — SODIUM CHLORIDE 0.9 % IV BOLUS
500.0000 mL | Freq: Once | INTRAVENOUS | Status: AC
  Administered 2023-04-13: 500 mL via INTRAVENOUS

## 2023-04-13 MED ORDER — SUCRALFATE 1 G PO TABS: 1.0000 g | ORAL_TABLET | Freq: Three times a day (TID) | ORAL | 0 refills | Status: AC

## 2023-04-13 NOTE — ED Notes (Signed)
 Patient discharged at 2145. VSS. pain 5/10

## 2023-04-13 NOTE — Discharge Instructions (Addendum)
 As discussed, your workup today was overall reassuring.  CT scan of your chest as well as her abdomen appeared normal.  Your laboratory studies also appeared normal.  Suspect your pain could be coming from stomach.  Will place you on reflux medicine to see if it is helpful.  Also you could be having pain before shingles rash as we discussed given that you have had similar pain in the past related to shingles.  Please do not hesitate to return to the emergency department for worrisome signs and symptoms we discussed become apparent.

## 2023-04-13 NOTE — ED Provider Notes (Signed)
 [facility] Provider Note   CSN: [id number] Arrival date & time: 04/13/23  1442     History  Chief Complaint  Patient presents with   Abdominal Pain    Korryn Pancoast is a 78 y.o. female.   Abdominal Pain   78 year old female presents emergency department with complaints of abdominal pain.  Patient reports right upper abdominal pain present since around 430 this morning.  States that has been constant since onset.  Does report associated nausea with 6-7 episodes of emesis since symptom onset.  States she has been having intermittent "flareups" of similar pain over the past few months.  States that has been associated with shingles in the past.  Has taken nothing for her pain.  Denies any fevers, chills, urinary symptoms, change in bowel habits.  Does report pain is slightly worsened with taking a deep breath.  Does state that she took Pepcid earlier today which did help some.  Past medical history significant for breast cancer, shingles, cholecystectomy, migraine, gastritis  Home Medications Prior to Admission medications   Medication Sig Start Date End Date Taking? Authorizing Provider  ondansetron (ZOFRAN-ODT) 4 MG disintegrating tablet Take 1 tablet (4 mg total) by mouth every 8 (eight) hours as needed. 04/13/23  Yes Sherian Maroon A, PA  pantoprazole (PROTONIX) 20 MG tablet Take 1 tablet (20 mg total) by mouth daily. 04/13/23  Yes Sherian Maroon A, PA  sucralfate (CARAFATE) 1 g tablet Take 1 tablet (1 g total) by mouth 4 (four) times daily -  with meals and at bedtime. 04/13/23  Yes Sherian Maroon A, PA  azelastine (ASTELIN) 137 MCG/SPRAY nasal spray Place 1 spray into the nose 2 (two) times daily as needed for rhinitis.    [provider]  doxycycline (VIBRAMYCIN) 100 MG capsule Take 1 capsule (100 mg total) by mouth 2 (two) times daily. 12/22/18   Jacalyn Lefevre, MD  feeding supplement (RESOURCE BREEZE) LIQD Take 1 Container by  mouth 3 (three) times daily between meals. 09/22/12   Rai, Delene Ruffini, MD  HYDROcodone-acetaminophen (NORCO/VICODIN) 5-325 MG tablet Take 1 tablet by mouth every 4 (four) hours as needed. 12/22/18   Jacalyn Lefevre, MD  meperidine (DEMEROL) 50 MG tablet Take 50-100 mg by mouth every 4 (four) hours as needed for pain.    [provider]  omeprazole (PRILOSEC) 40 MG capsule Take 40 mg by mouth daily.    [provider]  oxyCODONE-acetaminophen (PERCOCET/ROXICET) 5-325 MG tablet Take 1 tablet by mouth every 6 (six) hours as needed for severe pain. 08/21/18   Horton, Mayer Masker, MD  promethazine (PHENERGAN) 12.5 MG tablet Take 1 tablet (12.5 mg total) by mouth every 6 (six) hours as needed for nausea. 09/22/12   Rai, Ripudeep K, MD  temazepam (RESTORIL) 30 MG capsule Take 30 mg by mouth at bedtime as needed for sleep.    [provider]  traZODone (DESYREL) 50 MG tablet Take 50 mg by mouth at bedtime as needed for sleep or depression.    [provider]  valACYclovir (VALTREX) 1000 MG tablet Take 1 tablet (1,000 mg total) by mouth 3 (three) times daily. Till 09/24/12 then take daily 09/22/12   Rai, Delene Ruffini, MD  warfarin (COUMADIN) 4 MG tablet Take 4-6 mg by mouth daily with breakfast. Takes 4mg  for 2 days and takes 6mg  on day 3; then repeats the cycle.    [provider]      Allergies    Cephalosporins,  Penicillins, Codeine, Compazine [prochlorperazine edisylate], and Morphine and codeine    Review of Systems   Review of Systems  Gastrointestinal:  Positive for abdominal pain.    Physical Exam Updated Vital Signs BP (!) 158/75   Pulse 91   Temp 98.3 F (36.8 C) (Oral)   Resp 15   Ht 4\' 11"  (1.499 m)   Wt 43.1 kg   SpO2 99%   BMI 19.19 kg/m  Physical Exam Vitals and nursing note reviewed.  Constitutional:      General: She is not in acute distress.    Appearance: She is well-developed.  HENT:     Head: Normocephalic and atraumatic.  Eyes:      Conjunctiva/sclera: Conjunctivae normal.  Cardiovascular:     Rate and Rhythm: Normal rate and regular rhythm.     Heart sounds: No murmur heard. Pulmonary:     Effort: Pulmonary effort is normal. No respiratory distress.     Breath sounds: Normal breath sounds.  Abdominal:     Palpations: Abdomen is soft.     Tenderness: There is abdominal tenderness in the right upper quadrant and epigastric area. There is no right CVA tenderness, left CVA tenderness or guarding.  Musculoskeletal:        General: No swelling.     Cervical back: Neck supple.  Skin:    General: Skin is warm and dry.     Capillary Refill: Capillary refill takes less than 2 seconds.  Neurological:     Mental Status: She is alert.  Psychiatric:        Mood and Affect: Mood normal.     ED Results / Procedures / Treatments   Labs (all labs ordered are listed, but only abnormal results are displayed) Labs Reviewed  COMPREHENSIVE METABOLIC PANEL - Abnormal; Notable for the following components:      Result Value   Glucose, Bld 106 (*)    All other components within normal limits  URINALYSIS, ROUTINE W REFLEX MICROSCOPIC - Abnormal; Notable for the following components:   Hgb urine dipstick MODERATE (*)    Protein, ur 30 (*)    All other components within normal limits  URINALYSIS, MICROSCOPIC (REFLEX) - Abnormal; Notable for the following components:   Bacteria, UA MANY (*)    All other components within normal limits  LIPASE, BLOOD  CBC  TROPONIN I (HIGH SENSITIVITY)    EKG EKG Interpretation Date/Time:  Wednesday April 13 2023 15:33:07 EST Ventricular Rate:  81 PR Interval:  136 QRS Duration:  114 QT Interval:  404 QTC Calculation: 469 R Axis:   -44  Text Interpretation: Normal sinus rhythm Left axis deviation Confirmed by Virgina Norfolk [phone]) on 04/13/2023 3:39:48 PM  Radiology CT Angio Chest PE W/Cm &/Or Wo Cm Result Date: 04/13/2023 CLINICAL DATA:  Chest pain and shortness of breath.  Recently postop. High probability for pulmonary embolism. EXAM: CT ANGIOGRAPHY CHEST WITH CONTRAST TECHNIQUE: Multidetector CT imaging of the chest was performed using the standard protocol during bolus administration of intravenous contrast. Multiplanar CT image reconstructions and MIPs were obtained to evaluate the vascular anatomy. RADIATION DOSE REDUCTION: This exam was performed according to the departmental dose-optimization program which includes automated exposure control, adjustment of the mA and/or kV according to patient size and/or use of iterative reconstruction technique. CONTRAST:  OMNIPAQUE IOHEXOL 350 MG/ML SOLN COMPARISON:  02/22/2018 FINDINGS: Cardiovascular: Satisfactory opacification of pulmonary arteries noted, and no pulmonary emboli identified. No evidence of thoracic aortic dissection or aneurysm. Mediastinum/Nodes: No masses  or pathologically enlarged lymph nodes identified. Lungs/Pleura: No pulmonary mass, infiltrate, or effusion. Upper abdomen: No acute findings. Musculoskeletal: No suspicious bone lesions identified. Review of the MIP images confirms the above findings. IMPRESSION: Negative. No evidence of pulmonary embolism or other active disease. Electronically Signed   By: Danae Orleans M.D.   On: 04/13/2023 21:07   CT ABDOMEN PELVIS W CONTRAST Result Date: 04/13/2023 CLINICAL DATA:  Acute abdominal pain. EXAM: CT ABDOMEN AND PELVIS WITH CONTRAST TECHNIQUE: Multidetector CT imaging of the abdomen and pelvis was performed using the standard protocol following bolus administration of intravenous contrast. RADIATION DOSE REDUCTION: This exam was performed according to the departmental dose-optimization program which includes automated exposure control, adjustment of the mA and/or kV according to patient size and/or use of iterative reconstruction technique. CONTRAST:  OMNIPAQUE IOHEXOL 350 MG/ML SOLN COMPARISON:  03/18/2022 FINDINGS: Lower Chest: No acute findings.  Hepatobiliary: No suspicious hepatic masses identified. Prior cholecystectomy again noted. Stable mild chronic biliary ductal dilatation, likely due to post cholecystectomy effect. Pancreas:  No mass or inflammatory changes. Spleen: Within normal limits in size and appearance. Adrenals/Urinary Tract: No suspicious masses identified. No evidence of ureteral calculi or hydronephrosis. Stomach/Bowel: No evidence of obstruction, inflammatory process or abnormal fluid collections. Diverticulosis is seen mainly involving the sigmoid colon, however there is no evidence of diverticulitis. Vascular/Lymphatic: No pathologically enlarged lymph nodes. No acute vascular findings. Reproductive:  No mass or other significant abnormality. Other:  None. Musculoskeletal:  No suspicious bone lesions identified. IMPRESSION: No acute findings. Colonic diverticulosis, without radiographic evidence of diverticulitis. Electronically Signed   By: Danae Orleans M.D.   On: 04/13/2023 20:55    Procedures Procedures    Medications Ordered in ED Medications  ondansetron (ZOFRAN-ODT) disintegrating tablet 4 mg (4 mg Oral Given 04/13/23 1542)  sodium chloride 0.9 % bolus 500 mL (0 mLs Intravenous Stopped 04/13/23 1931)  ondansetron (ZOFRAN) injection 4 mg (4 mg Intravenous Given 04/13/23 1742)  fentaNYL (SUBLIMAZE) injection 25 mcg (25 mcg Intravenous Given 04/13/23 1741)  iohexol (OMNIPAQUE) 350 MG/ML injection 100 mL (100 mLs Intravenous Contrast Given 04/13/23 1758)  HYDROmorphone (DILAUDID) injection 1 mg (1 mg Intravenous Given 04/13/23 1826)    ED Course/ Medical Decision Making/ A&P                                 Medical Decision Making Amount and/or Complexity of Data Reviewed Labs: ordered. Radiology: ordered.  Risk Prescription drug management.   This patient presents to the ED for concern of abdominal pain, this involves an extensive number of treatment options, and is a complaint that carries with it a high risk of  complications and morbidity.  The differential diagnosis includes gastritis, PUD, cholecystitis, CBD pathology, ACS, especially LBO, volvulus, pancreatitis, other   Co morbidities that complicate the patient evaluation  See HPI   Additional history obtained:  Additional history obtained from EMR External records from outside source obtained and reviewed including hospital records   Lab Tests:  I Ordered, and personally interpreted labs.  The pertinent results include: No leukocytosis.  No evidence of anemia.  Platelets within range.  No electrolyte abnormalities.  No transaminitis.  No renal dysfunction.  UA 30 protein, many bacteria, greater than 50 RBCs.  Troponin 4.  Lipase within normal limits   Imaging Studies ordered:  I ordered imaging studies including CT abdomen pelvis, CT angio chest PE I independently visualized and interpreted imaging  which showed  CT abdomen pelvis: No acute abnormality CT angio chest PE: No acute abnormality I agree with the radiologist interpretation   Cardiac Monitoring: / EKG:  The patient was maintained on a cardiac monitor.  I personally viewed and interpreted the cardiac monitored which showed an underlying rhythm of: Normal sinus rhythm.  Left axis deviation.   Consultations Obtained:  N/a   Problem List / ED Course / Critical interventions / Medication management  Upper abdominal pain I ordered medication including Zofran, fentanyl, normal saline  Reevaluation of the patient after these medicines showed that the patient improved I have reviewed the patients home medicines and have made adjustments as needed   Social Determinants of Health:  Denies tobacco, illicit drug use.   Test / Admission - Considered:  Upper abdominal pain Vitals signs significant for hypertension blood pressure 163/78. Otherwise within normal range and stable throughout visit. Laboratory/imaging studies significant for: See above 78 year old female  presents emergency department with complaints of abdominal pain.  Patient reports right upper abdominal pain present since around 430 this morning.  States that has been constant since onset.  Does report associated nausea with 6-7 episodes of emesis since symptom onset.  States she has been having intermittent "flareups" of similar pain over the past few months.  States that has been associated with shingles in the past.  Has taken nothing for her pain.  Denies any fevers, chills, urinary symptoms, change in bowel habits.  Does report pain is slightly worsened with taking a deep breath.  Does state that she took Pepcid earlier today which did help some. On exam, tenderness epigastric/right upper abdomen.  Laboratory studies reassuring.  Patient with negative troponin, lack of acute ischemic change on EKG; low suspicion for ACS.  Patient is with history of breast cancer and pain was prescribed somewhat as pleuritic in nature so CT imaging of patient's chest was ordered which was negative for PE or other acute cardiopulmonary malady.  CT imaging of patient's abdomen also without evidence of acute intra-abdominal/pelvic abnormality.  Suspect patient symptoms could be secondary to gastritis versus GERD especially given improvement of symptoms with Pepcid in the outpatient setting.  Patient also with history of herpes zoster affecting right sided dermatome and reported similar symptoms in the past with similar pain.  Symptoms could certainly be secondary to neuralgia although patient without any rash currently.  Will trial PPI, dietary changes for presumed gastric etiology of symptoms.  Will recommend follow-up with PCP in the outpatient setting for reassessment.  Treatment plan discussed at length with patient and she acknowledged understanding was agreeable to said plan.  Patient overall well-appearing, afebrile in no acute distress. Worrisome signs and symptoms were discussed with the patient, and the patient  acknowledged understanding to return to the ED if noticed. Patient was stable upon discharge.          Final Clinical Impression(s) / ED Diagnoses Final diagnoses:  Epigastric pain    Rx / DC Orders ED Discharge Orders          Ordered    ondansetron (ZOFRAN-ODT) 4 MG disintegrating tablet  Every 8 hours PRN        04/13/23 2112    pantoprazole (PROTONIX) 20 MG tablet  Daily        04/13/23 2112    sucralfate (CARAFATE) 1 g tablet  3 times daily with meals & bedtime        04/13/23 2113  Peter Garter, [place] 04/13/23 2224    Virgina Norfolk, DO 04/13/23 2229

## 2023-04-13 NOTE — ED Triage Notes (Signed)
 C/o RUQ pain since 0430 this morning. States recently had shingles flare up and started antivirals. Does not have gallbladder. Endorses n/v

## 2023-07-03 ENCOUNTER — Other Ambulatory Visit: Payer: Self-pay

## 2023-07-03 ENCOUNTER — Emergency Department (HOSPITAL_BASED_OUTPATIENT_CLINIC_OR_DEPARTMENT_OTHER)

## 2023-07-03 ENCOUNTER — Emergency Department (HOSPITAL_BASED_OUTPATIENT_CLINIC_OR_DEPARTMENT_OTHER)
Admission: EM | Admit: 2023-07-03 | Discharge: 2023-07-03 | Disposition: A | Discharge status: Home / Self Care | Attending: Emergency Medicine | Admitting: Emergency Medicine

## 2023-07-03 ENCOUNTER — Encounter (HOSPITAL_BASED_OUTPATIENT_CLINIC_OR_DEPARTMENT_OTHER): Payer: Self-pay

## 2023-07-03 DIAGNOSIS — Z7901 Long term (current) use of anticoagulants: Secondary | ICD-10-CM | POA: Diagnosis not present

## 2023-07-03 DIAGNOSIS — K625 Hemorrhage of anus and rectum: Secondary | ICD-10-CM | POA: Diagnosis present

## 2023-07-03 LAB — CBC
HCT: 39 % (ref 36.0–46.0)
Hemoglobin: 12.9 g/dL (ref 12.0–15.0)
MCH: 29.8 pg (ref 26.0–34.0)
MCHC: 33.1 g/dL (ref 30.0–36.0)
MCV: 90.1 fL (ref 80.0–100.0)
Platelets: 280 10*3/uL (ref 150–400)
RBC: 4.33 MIL/uL (ref 3.87–5.11)
RDW: 12 % (ref 11.5–15.5)
WBC: 8.2 10*3/uL (ref 4.0–10.5)
nRBC: 0 % (ref 0.0–0.2)

## 2023-07-03 LAB — COMPREHENSIVE METABOLIC PANEL WITH GFR
ALT: 12 U/L (ref 0–44)
AST: 22 U/L (ref 15–41)
Albumin: 4.5 g/dL (ref 3.5–5.0)
Alkaline Phosphatase: 54 U/L (ref 38–126)
Anion gap: 11 (ref 5–15)
BUN: 14 mg/dL (ref 8–23)
CO2: 27 mmol/L (ref 22–32)
Calcium: 9.4 mg/dL (ref 8.9–10.3)
Chloride: 102 mmol/L (ref 98–111)
Creatinine, Ser: 0.57 mg/dL (ref 0.44–1.00)
GFR, Estimated: >60 mL/min (ref >60)
Glucose, Bld: 104 mg/dL — ABNORMAL HIGH (ref 70–99)
Potassium: 3.9 mmol/L (ref 3.5–5.1)
Sodium: 140 mmol/L (ref 135–145)
Total Bilirubin: 0.5 mg/dL (ref 0.0–1.2)
Total Protein: 7 g/dL (ref 6.5–8.1)

## 2023-07-03 LAB — OCCULT BLOOD X 1 CARD TO LAB, STOOL: Fecal Occult Bld: NEGATIVE

## 2023-07-03 MED ORDER — IOHEXOL 300 MG/ML  SOLN
75.0000 mL | Freq: Once | INTRAMUSCULAR | Status: AC | PRN
  Administered 2023-07-03: 75 mL via INTRAVENOUS

## 2023-07-03 NOTE — ED Provider Notes (Addendum)
 [place] [facility] Provider Note   CSN: [id number] Arrival date & time: 07/03/23  1022     History  Chief Complaint  Patient presents with   Rectal Bleeding    Monica Khan is a 78 y.o. female.  Patient with a complaint of rectal bleeding for 3 days.  Nausea and fevers.  Patient is on doxycycline  for sinus infection.  Did vomit once.  Patient states she is got grade 3 hemorrhoids.  Denies any significant abdominal pain but some mild abdominal pain.  No diarrhea.       Home Medications Prior to Admission medications   Medication Sig Start Date End Date Taking? Authorizing Provider  azelastine (ASTELIN) 137 MCG/SPRAY nasal spray Place 1 spray into the nose 2 (two) times daily as needed for rhinitis.    [provider]  doxycycline  (VIBRAMYCIN ) 100 MG capsule Take 1 capsule (100 mg total) by mouth 2 (two) times daily. 12/22/18   Sueellen Emery, MD  feeding supplement (RESOURCE BREEZE) LIQD Take 1 Container by mouth 3 (three) times daily between meals. 09/22/12   Rai, Hurman Maiden, MD  HYDROcodone -acetaminophen  (NORCO/VICODIN) 5-325 MG tablet Take 1 tablet by mouth every 4 (four) hours as needed. 12/22/18   Sueellen Emery, MD  meperidine  (DEMEROL ) 50 MG tablet Take 50-100 mg by mouth every 4 (four) hours as needed for pain.    [provider]  omeprazole (PRILOSEC) 40 MG capsule Take 40 mg by mouth daily.    [provider]  ondansetron  (ZOFRAN -ODT) 4 MG disintegrating tablet Take 1 tablet (4 mg total) by mouth every 8 (eight) hours as needed. 04/13/23   Presho Butter, PA  oxyCODONE -acetaminophen  (PERCOCET/ROXICET) 5-325 MG tablet Take 1 tablet by mouth every 6 (six) hours as needed for severe pain. 08/21/18   Horton, Vonzella Guernsey, MD  pantoprazole  (PROTONIX ) 20 MG tablet Take 1 tablet (20 mg total) by mouth daily. 04/13/23   [place] Butter, PA  promethazine  (PHENERGAN ) 12.5 MG tablet Take 1 tablet (12.5 mg total) by  mouth every 6 (six) hours as needed for nausea. 09/22/12   Rai, Ripudeep K, MD  sucralfate  (CARAFATE ) 1 g tablet Take 1 tablet (1 g total) by mouth 4 (four) times daily -  with meals and at bedtime. 04/13/23   [place] Butter, PA  temazepam  (RESTORIL ) 30 MG capsule Take 30 mg by mouth at bedtime as needed for sleep.    [provider]  traZODone  (DESYREL ) 50 MG tablet Take 50 mg by mouth at bedtime as needed for sleep or depression.    [provider]  valACYclovir  (VALTREX ) 1000 MG tablet Take 1 tablet (1,000 mg total) by mouth 3 (three) times daily. Till 09/24/12 then take daily 09/22/12   Rai, Hurman Maiden, MD  warfarin (COUMADIN ) 4 MG tablet Take 4-6 mg by mouth daily with breakfast. Takes 4mg  for 2 days and takes 6mg  on day 3; then repeats the cycle.    [provider]      Allergies    Cephalosporins, Penicillins, Compazine [prochlorperazine edisylate], and Morphine and codeine    Review of Systems   Review of Systems  Constitutional:  Positive for fever. Negative for chills.  HENT:  Negative for ear pain and sore throat.   Eyes:  Negative for pain and visual disturbance.  Respiratory:  Negative for cough and shortness of breath.   Cardiovascular:  Negative for chest pain and palpitations.  Gastrointestinal:  Positive for abdominal pain, blood in  stool and nausea.  Genitourinary:  Negative for dysuria and hematuria.  Musculoskeletal:  Negative for arthralgias and back pain.  Skin:  Negative for color change and rash.  Neurological:  Negative for seizures and syncope.  All other systems reviewed and are negative.   Physical Exam Updated Vital Signs BP (!) 151/123   Pulse 82   Temp 98.1 F (36.7 C) (Oral)   Resp 18   Ht 1.524 m (5')   Wt 45.4 kg   SpO2 98%   BMI 19.53 kg/m  Physical Exam Vitals and nursing note reviewed.  Constitutional:      General: She is not in acute distress.    Appearance: Normal appearance. She is well-developed.  HENT:      Head: Normocephalic and atraumatic.  Eyes:     Extraocular Movements: Extraocular movements intact.     Conjunctiva/sclera: Conjunctivae normal.     Pupils: Pupils are equal, round, and reactive to light.  Cardiovascular:     Rate and Rhythm: Normal rate and regular rhythm.     Heart sounds: No murmur heard. Pulmonary:     Effort: Pulmonary effort is normal. No respiratory distress.     Breath sounds: Normal breath sounds.  Abdominal:     General: There is no distension.     Palpations: Abdomen is soft.     Tenderness: There is no abdominal tenderness. There is no guarding.  Genitourinary:    Rectum: Guaiac result negative.     Comments: Perianal area on the left buttocks area there was an area of skin excoriation close to the perianal area.  No induration no fluctuance no abscess area excoriation of the skin probably measures about 2.5 cm.  No active bleeding.  On rectal exam no tenderness.  Stool light brown.  Hemoccult pending. Musculoskeletal:        General: No swelling.     Cervical back: Neck supple.  Skin:    General: Skin is warm and dry.     Capillary Refill: Capillary refill takes less than 2 seconds.  Neurological:     General: No focal deficit present.     Mental Status: She is alert and oriented to person, place, and time.  Psychiatric:        Mood and Affect: Mood normal.     ED Results / Procedures / Treatments   Labs (all labs ordered are listed, but only abnormal results are displayed) Labs Reviewed  COMPREHENSIVE METABOLIC PANEL WITH GFR  CBC  URINALYSIS, ROUTINE W REFLEX MICROSCOPIC  POC OCCULT BLOOD, ED    EKG None  Radiology No results found.  Procedures Procedures    Medications Ordered in ED Medications - No data to display  ED Course/ Medical Decision Making/ A&P                                 Medical Decision Making Amount and/or Complexity of Data Reviewed Labs: ordered. Radiology: ordered.  Risk Prescription drug  management.   Will check urinalysis besides labs and get CT scan abdomen and pelvis.  Do not have a good explanation for the fever unless the sinusitis is playing a role with that but but they do not seem to be bothering her too much.  Has been on doxycycline  for that.  Hemoccult was negative.  I feel that the bleeding probably came from that excoriation on the skin.  Will have her follow-up with her regular  doctor regarding that.  No active bleeding today.  Patient's complete metabolic panel normal.  CBC white count 8.2 hemoglobin very good at 12.9 no significant blood loss.  And platelets of 280.  CT scan abdomen pelvis shows some diverticular changes always could be may be diverticular bleed but with the stool being heme-negative and being light brown unlikely no signs of diverticulosis.  No other acute findings.  I really feel it is that skin lesion that was excoriation there on the left buttocks area that is probably responsible for the bleeding.  Final Clinical Impression(s) / ED Diagnoses Final diagnoses:  Rectal bleeding    Rx / DC Orders ED Discharge Orders     None         Nicklas Barns, MD 07/03/23 1119    Nicklas Barns, MD 07/03/23 [phone]

## 2023-07-03 NOTE — Discharge Instructions (Signed)
 Follow-up with your doctor workup for the rectal bleeding negative except for this skin lesion on the left side of the buttocks near the anus.  Follow-up with your doctor would be important to make sure that this heals properly.  Be very careful with wiping because this could bleed again.  No active bleeding today.  Blood counts were normal CAT scan did not have any abnormalities other than diverticulosis.  And the Hemoccult of the stool was negative so no microscopic blood even.

## 2023-07-03 NOTE — ED Triage Notes (Signed)
 Reports worsening rectal pain and rectal bleeding for 3 days. Nausea, fevers, emesis x1  Reports hx of "grade 3 hemorrhoids"

## 2023-07-30 ENCOUNTER — Emergency Department (HOSPITAL_BASED_OUTPATIENT_CLINIC_OR_DEPARTMENT_OTHER): Admission: EM | Admit: 2023-07-30 | Discharge: 2023-07-30 | Disposition: A | Discharge status: Home / Self Care

## 2023-07-30 ENCOUNTER — Encounter (HOSPITAL_BASED_OUTPATIENT_CLINIC_OR_DEPARTMENT_OTHER): Payer: Self-pay | Admitting: Urology

## 2023-07-30 ENCOUNTER — Emergency Department (HOSPITAL_BASED_OUTPATIENT_CLINIC_OR_DEPARTMENT_OTHER)

## 2023-07-30 ENCOUNTER — Other Ambulatory Visit: Payer: Self-pay

## 2023-07-30 DIAGNOSIS — I83899 Varicose veins of unspecified lower extremities with other complications: Secondary | ICD-10-CM

## 2023-07-30 DIAGNOSIS — Z7901 Long term (current) use of anticoagulants: Secondary | ICD-10-CM | POA: Insufficient documentation

## 2023-07-30 DIAGNOSIS — I2510 Atherosclerotic heart disease of native coronary artery without angina pectoris: Secondary | ICD-10-CM | POA: Diagnosis not present

## 2023-07-30 DIAGNOSIS — R519 Headache, unspecified: Secondary | ICD-10-CM | POA: Insufficient documentation

## 2023-07-30 DIAGNOSIS — R55 Syncope and collapse: Secondary | ICD-10-CM | POA: Diagnosis present

## 2023-07-30 DIAGNOSIS — I83891 Varicose veins of right lower extremities with other complications: Secondary | ICD-10-CM | POA: Insufficient documentation

## 2023-07-30 DIAGNOSIS — R110 Nausea: Secondary | ICD-10-CM | POA: Diagnosis not present

## 2023-07-30 LAB — BASIC METABOLIC PANEL WITH GFR
Anion gap: 11 (ref 5–15)
BUN: 8 mg/dL (ref 8–23)
CO2: 27 mmol/L (ref 22–32)
Calcium: 9.2 mg/dL (ref 8.9–10.3)
Chloride: 104 mmol/L (ref 98–111)
Creatinine, Ser: 0.58 mg/dL (ref 0.44–1.00)
GFR, Estimated: >60 mL/min (ref >60)
Glucose, Bld: 107 mg/dL — ABNORMAL HIGH (ref 70–99)
Potassium: 4.2 mmol/L (ref 3.5–5.1)
Sodium: 142 mmol/L (ref 135–145)

## 2023-07-30 LAB — CBC
HCT: 37.7 % (ref 36.0–46.0)
Hemoglobin: 12.5 g/dL (ref 12.0–15.0)
MCH: 29.9 pg (ref 26.0–34.0)
MCHC: 33.2 g/dL (ref 30.0–36.0)
MCV: 90.2 fL (ref 80.0–100.0)
Platelets: 267 10*3/uL (ref 150–400)
RBC: 4.18 MIL/uL (ref 3.87–5.11)
RDW: 12 % (ref 11.5–15.5)
WBC: 6.8 10*3/uL (ref 4.0–10.5)
nRBC: 0 % (ref 0.0–0.2)

## 2023-07-30 LAB — PROTIME-INR
INR: 2.2 — ABNORMAL HIGH (ref 0.8–1.2)
Prothrombin Time: 25 s — ABNORMAL HIGH (ref 11.4–15.2)

## 2023-07-30 LAB — TROPONIN T, HIGH SENSITIVITY: Troponin T High Sensitivity: <15 ng/L (ref <19)

## 2023-07-30 MED ORDER — ONDANSETRON 4 MG PO TBDP: 4.0000 mg | ORAL_TABLET | Freq: Three times a day (TID) | ORAL | 0 refills | Status: AC | PRN

## 2023-07-30 MED ORDER — LACTATED RINGERS IV BOLUS
500.0000 mL | Freq: Once | INTRAVENOUS | Status: AC
  Administered 2023-07-30: 500 mL via INTRAVENOUS

## 2023-07-30 MED ORDER — METOCLOPRAMIDE HCL 5 MG/ML IJ SOLN
5.0000 mg | Freq: Once | INTRAMUSCULAR | Status: AC
  Administered 2023-07-30: 5 mg via INTRAVENOUS
  Filled 2023-07-30: qty 2

## 2023-07-30 MED ORDER — DIPHENHYDRAMINE HCL 50 MG/ML IJ SOLN
12.5000 mg | Freq: Once | INTRAMUSCULAR | Status: AC
  Administered 2023-07-30: 12.5 mg via INTRAVENOUS
  Filled 2023-07-30: qty 1

## 2023-07-30 MED ORDER — KETOROLAC TROMETHAMINE 15 MG/ML IJ SOLN
15.0000 mg | Freq: Once | INTRAMUSCULAR | Status: AC
  Administered 2023-07-30: 15 mg via INTRAVENOUS
  Filled 2023-07-30: qty 1

## 2023-07-30 NOTE — ED Notes (Signed)
Patient provided ginger ale for PO challenge 

## 2023-07-30 NOTE — Discharge Instructions (Addendum)
 Please take the Zofran  as needed for nausea.  Take your pain medication as needed at home.  Please follow-up with your doctor.  If you have worsening symptoms please go to [facility] or [facility] for further evaluation as we can do advanced imaging there if the need were to arise.  Your CT scan today did not show any acute findings.

## 2023-07-30 NOTE — ED Triage Notes (Signed)
 Pt states small vein on right leg started bleeding this am  No injury  On blood thinners States is was spraying out all over when at home  Also states headache to left side of head and feels nauseated

## 2023-07-30 NOTE — ED Provider Notes (Signed)
 [facility] Provider Note   CSN: [id number] Arrival date & time: 07/30/23  1501     Patient presents with: Coagulation Disorder   Monica Khan is a 78 y.o. female.   78 year old female with past medical history of atrial fibrillation and coronary artery disease on Coumadin  and Plavix presenting to the emergency department today after a near syncopal episode earlier today.  The patient states that she had some spontaneous bleeding from one of the veins on her legs this morning.  She states that this did bleed a significant amount.  The patient did have some lightheadedness and felt like she was going to pass out.  She states that she is also been having a headache over the past week or so.  This has been intermittent in nature.  Reports this is more of a pain/pressure sensation over the left side of her head.  She denies any exacerbating or alleviating factors.  Denies any associated fevers.  Denies any recent head injuries.  She states that this was worse today after this episode.  She has had some associated nausea but denies any vomiting.  Denies any associated chest pain.  She states that she does have a history of atrial fibrillation and she has been having palpitations now over the past 24 hours or so.        Prior to Admission medications   Medication Sig Start Date End Date Taking? Authorizing Provider  ondansetron  (ZOFRAN -ODT) 4 MG disintegrating tablet Take 1 tablet (4 mg total) by mouth every 8 (eight) hours as needed for nausea or vomiting. 07/30/23  Yes Ula Prentice SAUNDERS, MD  azelastine (ASTELIN) 137 MCG/SPRAY nasal spray Place 1 spray into the nose 2 (two) times daily as needed for rhinitis.    [provider]  doxycycline  (VIBRAMYCIN ) 100 MG capsule Take 1 capsule (100 mg total) by mouth 2 (two) times daily. 12/22/18   Dean Clarity, MD  feeding supplement (RESOURCE BREEZE) LIQD Take 1 Container by mouth 3 (three) times daily  between meals. 09/22/12   Rai, Nydia POUR, MD  HYDROcodone -acetaminophen  (NORCO/VICODIN) 5-325 MG tablet Take 1 tablet by mouth every 4 (four) hours as needed. 12/22/18   Dean Clarity, MD  meperidine  (DEMEROL ) 50 MG tablet Take 50-100 mg by mouth every 4 (four) hours as needed for pain.    [provider]  omeprazole (PRILOSEC) 40 MG capsule Take 40 mg by mouth daily.    [provider]  ondansetron  (ZOFRAN -ODT) 4 MG disintegrating tablet Take 1 tablet (4 mg total) by mouth every 8 (eight) hours as needed. 04/13/23   Silver Wonda LABOR, PA  oxyCODONE -acetaminophen  (PERCOCET/ROXICET) 5-325 MG tablet Take 1 tablet by mouth every 6 (six) hours as needed for severe pain. 08/21/18   Horton, Charmaine FALCON, MD  pantoprazole  (PROTONIX ) 20 MG tablet Take 1 tablet (20 mg total) by mouth daily. 04/13/23   Silver Wonda LABOR, PA  promethazine  (PHENERGAN ) 12.5 MG tablet Take 1 tablet (12.5 mg total) by mouth every 6 (six) hours as needed for nausea. 09/22/12   Rai, Nydia POUR, MD  sucralfate  (CARAFATE ) 1 g tablet Take 1 tablet (1 g total) by mouth 4 (four) times daily -  with meals and at bedtime. 04/13/23   Silver Wonda LABOR, PA  temazepam  (RESTORIL ) 30 MG capsule Take 30 mg by mouth at bedtime as needed for sleep.    [provider]  traZODone  (DESYREL ) 50 MG tablet Take 50 mg by mouth at bedtime  as needed for sleep or depression.    [provider]  valACYclovir  (VALTREX ) 1000 MG tablet Take 1 tablet (1,000 mg total) by mouth 3 (three) times daily. Till 09/24/12 then take daily 09/22/12   Rai, Nydia POUR, MD  warfarin (COUMADIN ) 4 MG tablet Take 4-6 mg by mouth daily with breakfast. Takes 4mg  for 2 days and takes 6mg  on day 3; then repeats the cycle.    [provider]    Allergies: Cephalosporins, Penicillins, Compazine [prochlorperazine edisylate], and Morphine and codeine    Review of Systems  Cardiovascular:  Positive for palpitations.  Gastrointestinal:  Positive for  nausea.  Neurological:  Positive for headaches.  Hematological:  Bruises/bleeds easily.  All other systems reviewed and are negative.   Updated Vital Signs BP (!) 153/61   Pulse 74   Temp 98.4 F (36.9 C)   Resp 19   Ht 5' (1.524 m)   Wt 45.3 kg   SpO2 97%   BMI 19.50 kg/m   Physical Exam Vitals and nursing note reviewed.   Gen: NAD, pale appearing Eyes: PERRL, EOMI HEENT: no oropharyngeal swelling Neck: trachea midline Resp: clear to auscultation bilaterally Card: RRR, no murmurs, rubs, or gallops Abd: nontender, nondistended Extremities: no calf tenderness, no edema, the patient does have varicose veins bilaterally, removed bloody dressing and there does appear to be a small scab over one of the varicose veins with no active bleeding Vascular: 2+ radial pulses bilaterally, 2+ DP pulses bilaterally Neuro: Cranial nerves intact, equal strength and sensation throughout bilateral upper and lower extremities Skin: no rashes Psyc: acting appropriately   (all labs ordered are listed, but only abnormal results are displayed) Labs Reviewed  BASIC METABOLIC PANEL WITH GFR - Abnormal; Notable for the following components:      Result Value   Glucose, Bld 107 (*)    All other components within normal limits  PROTIME-INR - Abnormal; Notable for the following components:   Prothrombin Time 25.0 (*)    INR 2.2 (*)    All other components within normal limits  CBC  TROPONIN T, HIGH SENSITIVITY  TROPONIN T, HIGH SENSITIVITY    EKG: EKG Interpretation Date/Time:  Saturday July 30 2023 16:12:37 EDT Ventricular Rate:  65 PR Interval:  152 QRS Duration:  121 QT Interval:  468 QTC Calculation: 487 R Axis:   21  Text Interpretation: Sinus rhythm Right bundle branch block Confirmed by Ula Barter [phone]) on 07/30/2023 4:16:29 PM  Radiology: CT Head Wo Contrast Result Date: 07/30/2023 CLINICAL DATA:  Headache, increasing frequency or severity. Left-sided headache. EXAM: CT  HEAD WITHOUT CONTRAST TECHNIQUE: Contiguous axial images were obtained from the base of the skull through the vertex without intravenous contrast. RADIATION DOSE REDUCTION: This exam was performed according to the departmental dose-optimization program which includes automated exposure control, adjustment of the mA and/or kV according to patient size and/or use of iterative reconstruction technique. COMPARISON:  CT head 10/13/2022. FINDINGS: Brain: No acute intracranial hemorrhage. No CT evidence of acute infarct. Nonspecific hypoattenuation in the periventricular and subcortical white matter favored to reflect chronic microvascular ischemic changes. Generalized parenchymal volume loss. No edema, mass effect, or midline shift. The basilar cisterns are patent. Ventricles: The ventricles are normal. Vascular: No hyperdense vessel or unexpected calcification. Skull: No acute or aggressive finding. Orbits: Orbits are symmetric. Sinuses: The visualized paranasal sinuses are clear. Other: Mastoid air cells are clear. IMPRESSION: No CT evidence of acute intracranial abnormality. Mild chronic microvascular ischemic changes. Generalized parenchymal volume  loss. Electronically Signed   By: Donnice Mania M.D.   On: 07/30/2023 17:17     Procedures   Medications Ordered in the ED  metoCLOPramide  (REGLAN ) injection 5 mg (5 mg Intravenous Given 07/30/23 1629)  diphenhydrAMINE  (BENADRYL ) injection 12.5 mg (12.5 mg Intravenous Given 07/30/23 1629)  lactated ringers bolus 500 mL (0 mLs Intravenous Stopped 07/30/23 1731)  ketorolac  (TORADOL ) 15 MG/ML injection 15 mg (15 mg Intravenous Given 07/30/23 1729)                                    Medical Decision Making 78 year old female with past medical history of atrial fibrillation, breast cancer in remission, and coronary artery disease on Plavix in addition to Coumadin  for the atrial fibrillation presenting to the emergency department today with headache as well as a near  syncopal episode after sounds like a fair amount of bleeding after she had some spontaneous bleeding from rupture of a varicose vein earlier today.  Will further evaluate the patient here with basic labs to evaluate for anemia or electrolyte abnormalities.  Will keep her on the cardiac monitor here to evaluate for arrhythmias.  Will give the patient IV fluids.  Will obtain a CT scan of her head to eval for intracranial hemorrhage or mass lesion given her history.  Based on description of her symptoms suspicion for subarachnoid hemorrhage is low at this time.  I will give the patient Reglan  and Benadryl  for her headache to see if this helps.  Will reevaluate for ultimate disposition.  The patient's labs here are reassuring.  CT scan is negative for any acute findings.  The patient is feeling better on reassessment after the medications here and is ambulatory with a steady gait.  She is tolerating p.o.  I think that she is stable for discharge.  Amount and/or Complexity of Data Reviewed Labs: ordered. Radiology: ordered.  Risk Prescription drug management.        Final diagnoses:  Nonintractable headache, unspecified chronicity pattern, unspecified headache type  Bleeding from varicose vein    ED Discharge Orders          Ordered    ondansetron  (ZOFRAN -ODT) 4 MG disintegrating tablet  Every 8 hours PRN        07/30/23 1749               Ula Prentice SAUNDERS, MD 07/30/23 1750
